# Patient Record
Sex: Male | Born: 1960 | Race: Black or African American | Hispanic: No | Marital: Married | Smoking: Never smoker
Health system: Southern US, Community
[De-identification: ages and names within clinical notes are randomized; demographics above are authoritative.]

## PROBLEM LIST (undated history)

## (undated) DIAGNOSIS — G4730 Sleep apnea, unspecified: Secondary | ICD-10-CM

## (undated) DIAGNOSIS — I714 Abdominal aortic aneurysm, without rupture, unspecified: Secondary | ICD-10-CM

## (undated) DIAGNOSIS — E785 Hyperlipidemia, unspecified: Secondary | ICD-10-CM

## (undated) DIAGNOSIS — I499 Cardiac arrhythmia, unspecified: Secondary | ICD-10-CM

## (undated) DIAGNOSIS — R079 Chest pain, unspecified: Secondary | ICD-10-CM

## (undated) DIAGNOSIS — R350 Frequency of micturition: Secondary | ICD-10-CM

## (undated) DIAGNOSIS — I2510 Atherosclerotic heart disease of native coronary artery without angina pectoris: Secondary | ICD-10-CM

## (undated) DIAGNOSIS — N182 Chronic kidney disease, stage 2 (mild): Secondary | ICD-10-CM

## (undated) DIAGNOSIS — N179 Acute kidney failure, unspecified: Secondary | ICD-10-CM

## (undated) DIAGNOSIS — H9191 Unspecified hearing loss, right ear: Secondary | ICD-10-CM

## (undated) DIAGNOSIS — Z87442 Personal history of urinary calculi: Secondary | ICD-10-CM

## (undated) DIAGNOSIS — I10 Essential (primary) hypertension: Secondary | ICD-10-CM

## (undated) DIAGNOSIS — F329 Major depressive disorder, single episode, unspecified: Secondary | ICD-10-CM

## (undated) DIAGNOSIS — Z951 Presence of aortocoronary bypass graft: Secondary | ICD-10-CM

## (undated) DIAGNOSIS — Q279 Congenital malformation of peripheral vascular system, unspecified: Secondary | ICD-10-CM

## (undated) DIAGNOSIS — Z9884 Bariatric surgery status: Secondary | ICD-10-CM

## (undated) DIAGNOSIS — F32A Depression, unspecified: Secondary | ICD-10-CM

## (undated) HISTORY — DX: Congenital malformation of peripheral vascular system, unspecified: Q27.9

## (undated) HISTORY — DX: Chronic kidney disease, stage 2 (mild): N18.2

## (undated) HISTORY — DX: Abdominal aortic aneurysm, without rupture, unspecified: I71.40

## (undated) HISTORY — PX: COLONOSCOPY: SHX174

## (undated) HISTORY — DX: Abdominal aortic aneurysm, without rupture: I71.4

## (undated) HISTORY — DX: Hyperlipidemia, unspecified: E78.5

## (undated) HISTORY — PX: ESOPHAGOGASTRODUODENOSCOPY: SHX1529

## (undated) HISTORY — PX: CARDIAC CATHETERIZATION: SHX172

---

## 1999-05-04 HISTORY — PX: CORONARY ARTERY BYPASS GRAFT: SHX141

## 2007-05-04 HISTORY — PX: BACK SURGERY: SHX140

## 2008-05-03 HISTORY — PX: LAPAROSCOPIC GASTRIC BYPASS: SUR771

## 2009-03-06 DIAGNOSIS — Z9884 Bariatric surgery status: Secondary | ICD-10-CM

## 2009-03-06 HISTORY — DX: Bariatric surgery status: Z98.84

## 2013-03-05 ENCOUNTER — Inpatient Hospital Stay (HOSPITAL_COMMUNITY)
Admission: EM | Admit: 2013-03-05 | Discharge: 2013-03-09 | DRG: 336 | Disposition: A | Discharge status: Other Acute Inpatient Hospital | Payer: BC Managed Care – PPO | Attending: Surgery | Admitting: Surgery

## 2013-03-05 ENCOUNTER — Emergency Department (HOSPITAL_COMMUNITY): Payer: BC Managed Care – PPO

## 2013-03-05 ENCOUNTER — Encounter (HOSPITAL_COMMUNITY)
Admission: EM | Disposition: A | Discharge status: Other Acute Inpatient Hospital | Payer: Self-pay | Source: Home / Self Care

## 2013-03-05 ENCOUNTER — Encounter (HOSPITAL_COMMUNITY): Payer: Self-pay | Admitting: Emergency Medicine

## 2013-03-05 DIAGNOSIS — K56609 Unspecified intestinal obstruction, unspecified as to partial versus complete obstruction: Secondary | ICD-10-CM | POA: Diagnosis present

## 2013-03-05 DIAGNOSIS — I714 Abdominal aortic aneurysm, without rupture, unspecified: Secondary | ICD-10-CM | POA: Diagnosis present

## 2013-03-05 DIAGNOSIS — K565 Intestinal adhesions [bands], unspecified as to partial versus complete obstruction: Principal | ICD-10-CM | POA: Diagnosis present

## 2013-03-05 DIAGNOSIS — I428 Other cardiomyopathies: Secondary | ICD-10-CM | POA: Diagnosis present

## 2013-03-05 DIAGNOSIS — I10 Essential (primary) hypertension: Secondary | ICD-10-CM | POA: Diagnosis present

## 2013-03-05 DIAGNOSIS — F1010 Alcohol abuse, uncomplicated: Secondary | ICD-10-CM | POA: Diagnosis present

## 2013-03-05 DIAGNOSIS — K458 Other specified abdominal hernia without obstruction or gangrene: Secondary | ICD-10-CM | POA: Diagnosis present

## 2013-03-05 DIAGNOSIS — Q438 Other specified congenital malformations of intestine: Secondary | ICD-10-CM

## 2013-03-05 DIAGNOSIS — Z79899 Other long term (current) drug therapy: Secondary | ICD-10-CM

## 2013-03-05 DIAGNOSIS — I2510 Atherosclerotic heart disease of native coronary artery without angina pectoris: Secondary | ICD-10-CM | POA: Diagnosis present

## 2013-03-05 DIAGNOSIS — E876 Hypokalemia: Secondary | ICD-10-CM | POA: Diagnosis present

## 2013-03-05 DIAGNOSIS — Z9884 Bariatric surgery status: Secondary | ICD-10-CM

## 2013-03-05 DIAGNOSIS — Z951 Presence of aortocoronary bypass graft: Secondary | ICD-10-CM

## 2013-03-05 HISTORY — DX: Presence of aortocoronary bypass graft: Z95.1

## 2013-03-05 HISTORY — DX: Essential (primary) hypertension: I10

## 2013-03-05 HISTORY — DX: Bariatric surgery status: Z98.84

## 2013-03-05 LAB — COMPREHENSIVE METABOLIC PANEL
ALT: 21 U/L (ref 0–53)
AST: 30 U/L (ref 0–37)
Alkaline Phosphatase: 59 U/L (ref 39–117)
CO2: 27 mEq/L (ref 19–32)
Calcium: 9.6 mg/dL (ref 8.4–10.5)
GFR calc Af Amer: 53 mL/min — ABNORMAL LOW (ref >90)
GFR calc non Af Amer: 46 mL/min — ABNORMAL LOW (ref >90)
Potassium: 4 mEq/L (ref 3.5–5.1)
Sodium: 138 mEq/L (ref 135–145)
Total Bilirubin: 0.5 mg/dL (ref 0.3–1.2)
Total Protein: 7.9 g/dL (ref 6.0–8.3)

## 2013-03-05 LAB — CBC WITH DIFFERENTIAL/PLATELET
Basophils Absolute: 0 10*3/uL (ref 0.0–0.1)
Basophils Relative: 0 % (ref 0–1)
Eosinophils Absolute: 0.1 10*3/uL (ref 0.0–0.7)
HCT: 33.7 % — ABNORMAL LOW (ref 39.0–52.0)
Hemoglobin: 11.1 g/dL — ABNORMAL LOW (ref 13.0–17.0)
Lymphocytes Relative: 6 % — ABNORMAL LOW (ref 12–46)
MCH: 24.7 pg — ABNORMAL LOW (ref 26.0–34.0)
MCHC: 32.9 g/dL (ref 30.0–36.0)
Monocytes Absolute: 0.4 10*3/uL (ref 0.1–1.0)
Monocytes Relative: 5 % (ref 3–12)
Neutro Abs: 6.8 10*3/uL (ref 1.7–7.7)
Platelets: 154 10*3/uL (ref 150–400)
RDW: 19.2 % — ABNORMAL HIGH (ref 11.5–15.5)
WBC: 7.8 10*3/uL (ref 4.0–10.5)

## 2013-03-05 LAB — LIPASE, BLOOD: Lipase: 194 U/L — ABNORMAL HIGH (ref 11–59)

## 2013-03-05 LAB — ETHANOL: Alcohol, Ethyl (B): <11 mg/dL (ref 0–11)

## 2013-03-05 SURGERY — LAPAROTOMY, EXPLORATORY
Anesthesia: General | Site: Abdomen

## 2013-03-05 SURGERY — LAPAROSCOPY, DIAGNOSTIC
Anesthesia: General | Site: Abdomen

## 2013-03-05 MED ORDER — LIDOCAINE HCL 2 % EX GEL
CUTANEOUS | Status: AC
  Filled 2013-03-05: qty 10

## 2013-03-05 MED ORDER — IOHEXOL 300 MG/ML  SOLN
100.0000 mL | Freq: Once | INTRAMUSCULAR | Status: AC | PRN
  Administered 2013-03-05: 100 mL via INTRAVENOUS

## 2013-03-05 MED ORDER — LACTATED RINGERS IV BOLUS (SEPSIS)
1000.0000 mL | Freq: Once | INTRAVENOUS | Status: AC
  Administered 2013-03-05: 1000 mL via INTRAVENOUS

## 2013-03-05 MED ORDER — PANTOPRAZOLE SODIUM 40 MG IV SOLR
40.0000 mg | Freq: Once | INTRAVENOUS | Status: AC
  Administered 2013-03-05: 40 mg via INTRAVENOUS
  Filled 2013-03-05: qty 40

## 2013-03-05 MED ORDER — IOHEXOL 300 MG/ML  SOLN
50.0000 mL | Freq: Once | INTRAMUSCULAR | Status: AC | PRN
  Administered 2013-03-05: 50 mL via ORAL

## 2013-03-05 MED ORDER — ONDANSETRON HCL 4 MG/2ML IJ SOLN
4.0000 mg | Freq: Once | INTRAMUSCULAR | Status: AC
  Administered 2013-03-05: 4 mg via INTRAVENOUS
  Filled 2013-03-05: qty 2

## 2013-03-05 NOTE — ED Notes (Signed)
Patient transported to X-ray 

## 2013-03-05 NOTE — ED Notes (Signed)
Pt from [facility], last ETOH 11/1, pint of whiskey. After pt is medically cleared can go back to [facility].  Call leslie from [facility], [phone]

## 2013-03-05 NOTE — ED Notes (Signed)
Bed: [facility] Expected date:  Expected time:  Means of arrival:  Comments: ems- from [facility], left sided abdominal pain, last ETOH 11/1, pint of whiskey , after medically cleared sent back to [facility]

## 2013-03-05 NOTE — Progress Notes (Signed)
Patient confirms his pcp is DR. Ashok Croon.  EDCM unable to locate this physician.

## 2013-03-05 NOTE — ED Notes (Signed)
Patient transported to Ultrasound 

## 2013-03-05 NOTE — ED Notes (Signed)
PER EMS: pt c/o abdominal pain right upper quadrant. 8/10 pain. N/V. 20 g L AC 4 mg of Zofran. Coming from [facility] detox from alcohol. NO NARCOTICS PLEASE per [facility].

## 2013-03-05 NOTE — ED Provider Notes (Signed)
CSN: [id number]     Arrival date & time 03/05/13  1736 History   First MD Initiated Contact with Patient 03/05/13 1746     Chief Complaint  Patient presents with  . Abdominal Pain   (Consider location/radiation/quality/duration/timing/severity/associated sxs/prior Treatment) HPI Comments: Pt with hx of gastric bypass at an outside hospital 3 years ago, alcohol abuse - currently at a rehab comes in with cc of abd pain. His current pain started last night, and has progressively gotten worse. The pain is epigastric, diffuse and with some nausea. Last BM was y'day - and was loose. Pt has no appetite.   Patient is a 52 y.o. male presenting with abdominal pain. The history is provided by the patient.  Abdominal Pain Associated symptoms: diarrhea, nausea and vomiting   Associated symptoms: no chest pain, no chills, no cough, no dysuria, no fever and no shortness of breath     Past Medical History  Diagnosis Date  . Hx of CABG   . Hypertension    Past Surgical History  Procedure Laterality Date  . Coronary artery bypass graft     No family history on file. History  Substance Use Topics  . Smoking status: Unknown If Ever Smoked  . Smokeless tobacco: Not on file  . Alcohol Use: No     Comment: pt detoxing     Review of Systems  Constitutional: Negative for fever, chills and activity change.  Eyes: Negative for visual disturbance.  Respiratory: Negative for cough, chest tightness and shortness of breath.   Cardiovascular: Negative for chest pain.  Gastrointestinal: Positive for nausea, vomiting, abdominal pain and diarrhea. Negative for blood in stool and abdominal distention.  Genitourinary: Negative for dysuria, enuresis and difficulty urinating.  Musculoskeletal: Negative for arthralgias and neck pain.  Neurological: Positive for dizziness. Negative for light-headedness and headaches.  Psychiatric/Behavioral: Negative for confusion.    Allergies  Review of patient's allergies  indicates no known allergies.  Home Medications   Current Outpatient Rx  Name  Route  Sig  Dispense  Refill  . amLODipine (NORVASC) 5 MG tablet   Oral   Take 5 mg by mouth daily.         Marland Kitchen aspirin 81 MG tablet   Oral   Take 81 mg by mouth daily.         . carvedilol (COREG) 25 MG tablet   Oral   Take 25 mg by mouth 3 (three) times daily with meals.         . chlordiazePOXIDE (LIBRIUM) 10 MG capsule   Oral   Take 20 mg by mouth daily as needed for anxiety.         . cholecalciferol (VITAMIN D) 1000 UNITS tablet   Oral   Take 2,000 Units by mouth daily.         Marland Kitchen ibuprofen (ADVIL,MOTRIN) 200 MG tablet   Oral   Take 200 mg by mouth every 6 (six) hours as needed for pain.         Marland Kitchen lisinopril (PRINIVIL,ZESTRIL) 40 MG tablet   Oral   Take 40 mg by mouth daily.         . potassium chloride SA (K-DUR,KLOR-CON) 20 MEQ tablet   Oral   Take 20 mEq by mouth 3 (three) times daily.         . simvastatin (ZOCOR) 20 MG tablet   Oral   Take 20 mg by mouth every evening.  BP 158/111  Pulse 91  Temp(Src) 99.6 F (37.6 C) (Oral)  Resp 16  SpO2 99% Physical Exam  Nursing note and vitals reviewed. Constitutional: He is oriented to person, place, and time. He appears well-developed.  HENT:  Head: Normocephalic and atraumatic.  Eyes: Conjunctivae and EOM are normal. Pupils are equal, round, and reactive to light.  Neck: Normal range of motion. Neck supple.  Cardiovascular: Normal rate and regular rhythm.   Pulmonary/Chest: Effort normal and breath sounds normal.  Abdominal: Soft. Bowel sounds are normal. He exhibits distension. There is tenderness. There is guarding. There is no rebound.  Neurological: He is alert and oriented to person, place, and time.  Skin: Skin is warm.    ED Course  Procedures (including critical care time) Labs Review Labs Reviewed  COMPREHENSIVE METABOLIC PANEL - Abnormal; Notable for the following:    Glucose, Bld 117  (*)    Creatinine, Ser 1.66 (*)    GFR calc non Af Amer 46 (*)    GFR calc Af Amer 53 (*)    All other components within normal limits  CBC WITH DIFFERENTIAL - Abnormal; Notable for the following:    Hemoglobin 11.1 (*)    HCT 33.7 (*)    MCV 75.1 (*)    MCH 24.7 (*)    RDW 19.2 (*)    Neutrophils Relative % 88 (*)    Lymphocytes Relative 6 (*)    Lymphs Abs 0.5 (*)    All other components within normal limits  LIPASE, BLOOD - Abnormal; Notable for the following:    Lipase 194 (*)    All other components within normal limits  ETHANOL  CG4 I-STAT (LACTIC ACID)   Imaging Review US Abdomen Complete  03/05/2013   CLINICAL DATA:  Abdominal pain. History of a gastric bypass surgery.  EXAM: ULTRASOUND ABDOMEN COMPLETE  COMPARISON:  None.  FINDINGS: Gallbladder  No gallstones or wall thickening visualized. No sonographic Murphy sign noted.  Common bile duct  Diameter: 5.3 mm. No duct stone is seen.  Liver  Lateral segment of the left lobe is mostly obscured. Remaining portions of the liver are within normal limits. Hepatopetal flow documented in the portal vein.  IVC  Obscured by midline bowel gas  Pancreas  Obscured by midline bowel gas  Spleen  Size and appearance within normal limits.  Right Kidney  Length: 12.0 cm. Echogenicity within normal limits. No mass or hydronephrosis visualized.  Left Kidney  Length: 12.2 cm. Echogenicity within normal limits. No mass or hydronephrosis visualized.  Abdominal aorta  Not visualized  IMPRESSION: 1. No acute findings. Normal gallbladder with no bile duct dilation. 2. Increased bowel gas limits exam obscuring the midline structures.   Electronically Signed   By: Amie Portland M.D.   On: 03/05/2013 20:12   Dg Abd Acute W/chest  03/05/2013   CLINICAL DATA:  52 year old male with nausea vomiting diarrhea.  EXAM: ACUTE ABDOMEN SERIES (ABDOMEN 2 VIEW & CHEST 1 VIEW)  COMPARISON:  None.  FINDINGS: Mild cardiomegaly. Other mediastinal contours are within normal  limits. Previous median sternotomy. Lungs are clear. No pneumothorax or pneumoperitoneum.  Dilated gas-filled small bowel loops with air-fluid levels. Small bowel measures up to 48 mm diameter. There is some colonic gas present. There are staple lines and surgical clips in the left abdomen.  No acute osseous abnormality identified.  IMPRESSION: 1. Acute small bowel obstruction, perhaps due to adhesions given evidence of previous left abdominal surgery. No pneumoperitoneum.  2. No  acute cardiopulmonary abnormality.   Electronically Signed   By: Augusto Gamble M.D.   On: 03/05/2013 20:40    EKG Interpretation   None       MDM  No diagnosis found.  Pt comes in with cc of abd pain.  DDx includes: Pancreatitis Hepatobiliary pathology including cholecystitis Gastritis/PUD SBO ACS syndrome Aortic Dissection  Pt's AAS shows SBO, and [place] is negative, with some lipase elevation. Will call surgery.      Derwood Kaplan, MD 03/05/13 2153

## 2013-03-05 NOTE — ED Notes (Signed)
NG suctioning turned off until pt returns from CT per Dr Brandy Hale verbal order.

## 2013-03-06 ENCOUNTER — Encounter (HOSPITAL_COMMUNITY): Payer: Self-pay | Admitting: Surgery

## 2013-03-06 ENCOUNTER — Inpatient Hospital Stay (HOSPITAL_COMMUNITY): Payer: BC Managed Care – PPO | Admitting: Anesthesiology

## 2013-03-06 ENCOUNTER — Encounter (HOSPITAL_COMMUNITY)
Admission: EM | Disposition: A | Discharge status: Other Acute Inpatient Hospital | Payer: Self-pay | Source: Home / Self Care

## 2013-03-06 ENCOUNTER — Encounter (HOSPITAL_COMMUNITY): Payer: BC Managed Care – PPO | Admitting: Anesthesiology

## 2013-03-06 DIAGNOSIS — I714 Abdominal aortic aneurysm, without rupture, unspecified: Secondary | ICD-10-CM

## 2013-03-06 DIAGNOSIS — K56609 Unspecified intestinal obstruction, unspecified as to partial versus complete obstruction: Secondary | ICD-10-CM | POA: Diagnosis present

## 2013-03-06 DIAGNOSIS — K660 Peritoneal adhesions (postprocedural) (postinfection): Secondary | ICD-10-CM

## 2013-03-06 DIAGNOSIS — Z9884 Bariatric surgery status: Secondary | ICD-10-CM

## 2013-03-06 HISTORY — PX: LAPAROSCOPY: SHX197

## 2013-03-06 HISTORY — PX: LAPAROSCOPIC LYSIS OF ADHESIONS: SHX5905

## 2013-03-06 LAB — CBC
Hemoglobin: 9.4 g/dL — ABNORMAL LOW (ref 13.0–17.0)
MCHC: 32.4 g/dL (ref 30.0–36.0)
RDW: 19.8 % — ABNORMAL HIGH (ref 11.5–15.5)
WBC: 3.4 10*3/uL — ABNORMAL LOW (ref 4.0–10.5)

## 2013-03-06 LAB — GLUCOSE, CAPILLARY: Glucose-Capillary: 96 mg/dL (ref 70–99)

## 2013-03-06 LAB — CREATININE, SERUM
Creatinine, Ser: 1.56 mg/dL — ABNORMAL HIGH (ref 0.50–1.35)
GFR calc non Af Amer: 49 mL/min — ABNORMAL LOW (ref >90)

## 2013-03-06 SURGERY — LAPAROSCOPY, DIAGNOSTIC
Anesthesia: General | Site: Abdomen

## 2013-03-06 MED ORDER — SODIUM CHLORIDE 0.9 % IJ SOLN
INTRAMUSCULAR | Status: AC
  Filled 2013-03-06: qty 3

## 2013-03-06 MED ORDER — CEFAZOLIN SODIUM-DEXTROSE 2-3 GM-% IV SOLR
2.0000 g | Freq: Once | INTRAVENOUS | Status: AC
  Administered 2013-03-06: 2 g via INTRAVENOUS
  Filled 2013-03-06: qty 50

## 2013-03-06 MED ORDER — BUPIVACAINE-EPINEPHRINE 0.25% -1:200000 IJ SOLN
INTRAMUSCULAR | Status: AC
  Filled 2013-03-06: qty 2

## 2013-03-06 MED ORDER — LACTATED RINGERS IV SOLN: INTRAVENOUS | Status: DC

## 2013-03-06 MED ORDER — BISACODYL 10 MG RE SUPP: 10.0000 mg | Freq: Two times a day (BID) | RECTAL | Status: DC | PRN

## 2013-03-06 MED ORDER — MENTHOL 3 MG MT LOZG
1.0000 | LOZENGE | OROMUCOSAL | Status: DC | PRN
  Filled 2013-03-06: qty 9

## 2013-03-06 MED ORDER — VITAMIN B-1 100 MG PO TABS
100.0000 mg | ORAL_TABLET | Freq: Every day | ORAL | Status: DC
  Administered 2013-03-07 – 2013-03-09 (×3): 100 mg via ORAL
  Filled 2013-03-06 (×4): qty 1

## 2013-03-06 MED ORDER — HYDROMORPHONE HCL PF 1 MG/ML IJ SOLN
0.5000 mg | INTRAMUSCULAR | Status: DC | PRN
  Filled 2013-03-06: qty 1

## 2013-03-06 MED ORDER — DIPHENHYDRAMINE HCL 50 MG/ML IJ SOLN: 12.5000 mg | Freq: Four times a day (QID) | INTRAMUSCULAR | Status: DC | PRN

## 2013-03-06 MED ORDER — LORAZEPAM 2 MG/ML IJ SOLN: 0.0000 mg | Freq: Two times a day (BID) | INTRAMUSCULAR | Status: DC

## 2013-03-06 MED ORDER — PANTOPRAZOLE SODIUM 40 MG IV SOLR
40.0000 mg | Freq: Two times a day (BID) | INTRAVENOUS | Status: DC
  Administered 2013-03-06 – 2013-03-08 (×4): 40 mg via INTRAVENOUS
  Filled 2013-03-06 (×5): qty 40

## 2013-03-06 MED ORDER — EPHEDRINE SULFATE 50 MG/ML IJ SOLN
INTRAMUSCULAR | Status: DC | PRN
  Administered 2013-03-06: 10 mg via INTRAVENOUS

## 2013-03-06 MED ORDER — LORAZEPAM 1 MG PO TABS: 1.0000 mg | ORAL_TABLET | Freq: Four times a day (QID) | ORAL | Status: AC | PRN

## 2013-03-06 MED ORDER — CISATRACURIUM BESYLATE (PF) 10 MG/5ML IV SOLN
INTRAVENOUS | Status: DC | PRN
  Administered 2013-03-06: 2 mg via INTRAVENOUS
  Administered 2013-03-06: 8 mg via INTRAVENOUS

## 2013-03-06 MED ORDER — PHENYLEPHRINE HCL 10 MG/ML IJ SOLN
INTRAMUSCULAR | Status: DC | PRN
  Administered 2013-03-06 (×3): 40 ug via INTRAVENOUS

## 2013-03-06 MED ORDER — LORAZEPAM 2 MG/ML IJ SOLN
0.0000 mg | Freq: Four times a day (QID) | INTRAMUSCULAR | Status: AC
  Administered 2013-03-06: 2 mg via INTRAVENOUS
  Filled 2013-03-06 (×2): qty 1

## 2013-03-06 MED ORDER — ALUM & MAG HYDROXIDE-SIMETH 200-200-20 MG/5ML PO SUSP: 30.0000 mL | Freq: Four times a day (QID) | ORAL | Status: DC | PRN

## 2013-03-06 MED ORDER — ADULT MULTIVITAMIN W/MINERALS CH
1.0000 | ORAL_TABLET | Freq: Every day | ORAL | Status: DC
  Administered 2013-03-07 – 2013-03-09 (×3): 1 via ORAL
  Filled 2013-03-06 (×4): qty 1

## 2013-03-06 MED ORDER — AMLODIPINE BESYLATE 5 MG PO TABS
5.0000 mg | ORAL_TABLET | Freq: Every day | ORAL | Status: DC
  Administered 2013-03-06 – 2013-03-09 (×4): 5 mg via ORAL
  Filled 2013-03-06 (×4): qty 1

## 2013-03-06 MED ORDER — FOLIC ACID 1 MG PO TABS
1.0000 mg | ORAL_TABLET | Freq: Every day | ORAL | Status: DC
  Administered 2013-03-07 – 2013-03-09 (×3): 1 mg via ORAL
  Filled 2013-03-06 (×4): qty 1

## 2013-03-06 MED ORDER — KCL IN DEXTROSE-NACL 20-5-0.9 MEQ/L-%-% IV SOLN
INTRAVENOUS | Status: DC
  Administered 2013-03-06: 18:00:00 via INTRAVENOUS
  Administered 2013-03-07: 125 mL/h via INTRAVENOUS
  Administered 2013-03-07: 02:00:00 via INTRAVENOUS
  Administered 2013-03-07: 125 mL/h via INTRAVENOUS
  Filled 2013-03-06 (×9): qty 1000

## 2013-03-06 MED ORDER — GLYCOPYRROLATE 0.2 MG/ML IJ SOLN
INTRAMUSCULAR | Status: DC | PRN
  Administered 2013-03-06: .8 mg via INTRAVENOUS

## 2013-03-06 MED ORDER — SUCCINYLCHOLINE CHLORIDE 20 MG/ML IJ SOLN
INTRAMUSCULAR | Status: DC | PRN
  Administered 2013-03-06: 100 mg via INTRAVENOUS

## 2013-03-06 MED ORDER — NEOSTIGMINE METHYLSULFATE 1 MG/ML IJ SOLN
INTRAMUSCULAR | Status: DC | PRN
  Administered 2013-03-06: 5 mg via INTRAVENOUS

## 2013-03-06 MED ORDER — KCL IN DEXTROSE-NACL 40-5-0.45 MEQ/L-%-% IV SOLN
INTRAVENOUS | Status: DC
  Filled 2013-03-06 (×2): qty 1000

## 2013-03-06 MED ORDER — MAGIC MOUTHWASH
15.0000 mL | Freq: Four times a day (QID) | ORAL | Status: DC | PRN
  Filled 2013-03-06: qty 15

## 2013-03-06 MED ORDER — LACTATED RINGERS IV SOLN
INTRAVENOUS | Status: DC | PRN
  Administered 2013-03-06 (×2): via INTRAVENOUS

## 2013-03-06 MED ORDER — CARVEDILOL 25 MG PO TABS
25.0000 mg | ORAL_TABLET | Freq: Three times a day (TID) | ORAL | Status: DC
  Administered 2013-03-06 – 2013-03-09 (×9): 25 mg via ORAL
  Filled 2013-03-06 (×11): qty 1

## 2013-03-06 MED ORDER — METOPROLOL TARTRATE 1 MG/ML IV SOLN
5.0000 mg | Freq: Four times a day (QID) | INTRAVENOUS | Status: DC | PRN
  Filled 2013-03-06: qty 5

## 2013-03-06 MED ORDER — FENTANYL CITRATE 0.05 MG/ML IJ SOLN: 25.0000 ug | INTRAMUSCULAR | Status: DC | PRN

## 2013-03-06 MED ORDER — LORAZEPAM 2 MG/ML IJ SOLN
INTRAMUSCULAR | Status: DC | PRN
  Administered 2013-03-06: 2 mg via INTRAVENOUS

## 2013-03-06 MED ORDER — CEFAZOLIN SODIUM-DEXTROSE 2-3 GM-% IV SOLR: 2.0000 g | INTRAVENOUS | Status: DC

## 2013-03-06 MED ORDER — PROPOFOL 10 MG/ML IV BOLUS
INTRAVENOUS | Status: DC | PRN
  Administered 2013-03-06: 170 mg via INTRAVENOUS

## 2013-03-06 MED ORDER — LACTATED RINGERS IR SOLN
Status: DC | PRN
  Administered 2013-03-06: 1000 mL

## 2013-03-06 MED ORDER — 0.9 % SODIUM CHLORIDE (POUR BTL) OPTIME
TOPICAL | Status: DC | PRN
  Administered 2013-03-06: 1000 mL

## 2013-03-06 MED ORDER — METRONIDAZOLE IN NACL 5-0.79 MG/ML-% IV SOLN
INTRAVENOUS | Status: AC
  Filled 2013-03-06: qty 100

## 2013-03-06 MED ORDER — ONDANSETRON HCL 4 MG/2ML IJ SOLN
INTRAMUSCULAR | Status: DC | PRN
  Administered 2013-03-06: 4 mg via INTRAVENOUS

## 2013-03-06 MED ORDER — ACETAMINOPHEN 650 MG RE SUPP: 650.0000 mg | Freq: Four times a day (QID) | RECTAL | Status: DC | PRN

## 2013-03-06 MED ORDER — LACTATED RINGERS IV BOLUS (SEPSIS): 1000.0000 mL | Freq: Once | INTRAVENOUS | Status: DC

## 2013-03-06 MED ORDER — METRONIDAZOLE IN NACL 5-0.79 MG/ML-% IV SOLN
INTRAVENOUS | Status: DC | PRN
  Administered 2013-03-06: 500 mg via INTRAVENOUS

## 2013-03-06 MED ORDER — DEXAMETHASONE SODIUM PHOSPHATE 10 MG/ML IJ SOLN
INTRAMUSCULAR | Status: DC | PRN
  Administered 2013-03-06: 10 mg via INTRAVENOUS

## 2013-03-06 MED ORDER — HEPARIN SODIUM (PORCINE) 5000 UNIT/ML IJ SOLN
5000.0000 [IU] | Freq: Three times a day (TID) | INTRAMUSCULAR | Status: DC
  Administered 2013-03-07 – 2013-03-09 (×6): 5000 [IU] via SUBCUTANEOUS
  Filled 2013-03-06 (×10): qty 1

## 2013-03-06 MED ORDER — THIAMINE HCL 100 MG/ML IJ SOLN
100.0000 mg | Freq: Every day | INTRAMUSCULAR | Status: DC
  Administered 2013-03-06: 100 mg via INTRAVENOUS
  Filled 2013-03-06 (×4): qty 1

## 2013-03-06 MED ORDER — LACTATED RINGERS IV SOLN
INTRAVENOUS | Status: DC | PRN
  Administered 2013-03-06 (×3): via INTRAVENOUS

## 2013-03-06 MED ORDER — LORAZEPAM 2 MG/ML IJ SOLN: 0.5000 mg | Freq: Three times a day (TID) | INTRAMUSCULAR | Status: DC | PRN

## 2013-03-06 MED ORDER — LACTATED RINGERS IV SOLN
INTRAVENOUS | Status: DC
  Administered 2013-03-06: 1000 mL via INTRAVENOUS

## 2013-03-06 MED ORDER — LIP MEDEX EX OINT
1.0000 "application " | TOPICAL_OINTMENT | Freq: Two times a day (BID) | CUTANEOUS | Status: DC
  Administered 2013-03-06 – 2013-03-09 (×5): 1 via TOPICAL
  Filled 2013-03-06 (×2): qty 7

## 2013-03-06 MED ORDER — CEFAZOLIN SODIUM-DEXTROSE 2-3 GM-% IV SOLR
INTRAVENOUS | Status: DC | PRN
  Administered 2013-03-06: 2 g via INTRAVENOUS

## 2013-03-06 MED ORDER — BUPIVACAINE-EPINEPHRINE 0.25% -1:200000 IJ SOLN
INTRAMUSCULAR | Status: DC | PRN
  Administered 2013-03-06: 50 mL

## 2013-03-06 MED ORDER — LORAZEPAM 2 MG/ML IJ SOLN
2.0000 mg | Freq: Once | INTRAMUSCULAR | Status: DC
  Filled 2013-03-06: qty 1

## 2013-03-06 MED ORDER — LACTATED RINGERS IV BOLUS (SEPSIS): 1000.0000 mL | Freq: Three times a day (TID) | INTRAVENOUS | Status: AC | PRN

## 2013-03-06 MED ORDER — HYDROMORPHONE HCL PF 1 MG/ML IJ SOLN: 0.2500 mg | INTRAMUSCULAR | Status: DC | PRN

## 2013-03-06 MED ORDER — FENTANYL CITRATE 0.05 MG/ML IJ SOLN
INTRAMUSCULAR | Status: DC | PRN
  Administered 2013-03-06 (×2): 50 ug via INTRAVENOUS
  Administered 2013-03-06: 125 ug via INTRAVENOUS
  Administered 2013-03-06: 25 ug via INTRAVENOUS
  Administered 2013-03-06: 50 ug via INTRAVENOUS

## 2013-03-06 MED ORDER — LORAZEPAM 2 MG/ML IJ SOLN: 1.0000 mg | Freq: Four times a day (QID) | INTRAMUSCULAR | Status: AC | PRN

## 2013-03-06 MED ORDER — PROMETHAZINE HCL 25 MG/ML IJ SOLN
12.5000 mg | Freq: Four times a day (QID) | INTRAMUSCULAR | Status: DC | PRN
  Filled 2013-03-06: qty 1

## 2013-03-06 MED ORDER — PHENOL 1.4 % MT LIQD
2.0000 | OROMUCOSAL | Status: DC | PRN
  Filled 2013-03-06: qty 177

## 2013-03-06 MED ORDER — METRONIDAZOLE IN NACL 5-0.79 MG/ML-% IV SOLN: 500.0000 mg | INTRAVENOUS | Status: DC

## 2013-03-06 MED ORDER — FENTANYL CITRATE 0.05 MG/ML IJ SOLN: 50.0000 ug | INTRAMUSCULAR | Status: DC | PRN

## 2013-03-06 MED ORDER — CEFAZOLIN SODIUM-DEXTROSE 2-3 GM-% IV SOLR
INTRAVENOUS | Status: AC
  Filled 2013-03-06: qty 50

## 2013-03-06 MED ORDER — ONDANSETRON HCL 4 MG/2ML IJ SOLN: 4.0000 mg | Freq: Four times a day (QID) | INTRAMUSCULAR | Status: DC | PRN

## 2013-03-06 SURGICAL SUPPLY — 66 items
APPLIER CLIP 5 13 M/L LIGAMAX5 (MISCELLANEOUS)
APPLIER CLIP ROT 10 11.4 M/L (STAPLE)
BLADE EXTENDED COATED 6.5IN (ELECTRODE) IMPLANT
BLADE HEX COATED 2.75 (ELECTRODE) ×2 IMPLANT
BLADE SURG SZ10 CARB STEEL (BLADE) ×2 IMPLANT
CANISTER SUCTION 2500CC (MISCELLANEOUS) ×4 IMPLANT
CATH KIT ON Q 7.5IN SLV (PAIN MANAGEMENT) IMPLANT
CHLORAPREP W/TINT 26ML (MISCELLANEOUS) ×4 IMPLANT
CLIP APPLIE 5 13 M/L LIGAMAX5 (MISCELLANEOUS) IMPLANT
CLIP APPLIE ROT 10 11.4 M/L (STAPLE) IMPLANT
COVER MAYO STAND STRL (DRAPES) ×2 IMPLANT
DECANTER SPIKE VIAL GLASS SM (MISCELLANEOUS) ×4 IMPLANT
DERMABOND ADVANCED (GAUZE/BANDAGES/DRESSINGS)
DERMABOND ADVANCED .7 DNX12 (GAUZE/BANDAGES/DRESSINGS) IMPLANT
DRAPE LAPAROSCOPIC ABDOMINAL (DRAPES) ×4 IMPLANT
DRAPE UTILITY XL STRL (DRAPES) ×4 IMPLANT
DRAPE WARM FLUID 44X44 (DRAPE) ×2 IMPLANT
DRSG TEGADERM 2-3/8X2-3/4 SM (GAUZE/BANDAGES/DRESSINGS) ×10 IMPLANT
DRSG TEGADERM 4X4.75 (GAUZE/BANDAGES/DRESSINGS) IMPLANT
ELECT REM PT RETURN 9FT ADLT (ELECTROSURGICAL) ×4
ELECTRODE REM PT RTRN 9FT ADLT (ELECTROSURGICAL) ×2 IMPLANT
GAUZE SPONGE 2X2 8PLY STRL LF (GAUZE/BANDAGES/DRESSINGS) ×1 IMPLANT
GLOVE ECLIPSE 8.0 STRL XLNG CF (GLOVE) ×2 IMPLANT
GLOVE INDICATOR 8.0 STRL GRN (GLOVE) ×2 IMPLANT
GLOVE SURG SS PI 7.5 STRL IVOR (GLOVE) ×4 IMPLANT
GOWN STRL REIN XL XLG (GOWN DISPOSABLE) ×10 IMPLANT
KIT BASIN OR (CUSTOM PROCEDURE TRAY) ×4 IMPLANT
LIGASURE IMPACT 36 18CM CVD LR (INSTRUMENTS) IMPLANT
PENCIL BUTTON HOLSTER BLD 10FT (ELECTRODE) ×2 IMPLANT
SCALPEL HARMONIC ACE (MISCELLANEOUS) IMPLANT
SCISSORS LAP 5X35 DISP (ENDOMECHANICALS) ×4 IMPLANT
SEALER TISSUE G2 CVD JAW 35 (ENDOMECHANICALS) IMPLANT
SEALER TISSUE G2 CVD JAW 45CM (ENDOMECHANICALS)
SEALER TISSUE G2 STRG ARTC 35C (ENDOMECHANICALS) IMPLANT
SET IRRIG TUBING LAPAROSCOPIC (IRRIGATION / IRRIGATOR) ×2 IMPLANT
SLEEVE XCEL OPT CAN 5 100 (ENDOMECHANICALS) ×8 IMPLANT
SOLUTION ANTI FOG 6CC (MISCELLANEOUS) ×2 IMPLANT
SPONGE GAUZE 2X2 STER 10/PKG (GAUZE/BANDAGES/DRESSINGS) ×1
SPONGE GAUZE 4X4 12PLY (GAUZE/BANDAGES/DRESSINGS) ×2 IMPLANT
SPONGE LAP 18X18 X RAY DECT (DISPOSABLE) IMPLANT
STAPLER VISISTAT 35W (STAPLE) IMPLANT
SUCTION POOLE TIP (SUCTIONS) IMPLANT
SUT MNCRL AB 4-0 PS2 18 (SUTURE) ×2 IMPLANT
SUT PDS AB 1 TP1 96 (SUTURE) IMPLANT
SUT PROLENE 2 0 KS (SUTURE) IMPLANT
SUT PROLENE 2 0 SH DA (SUTURE) IMPLANT
SUT SILK 2 0 (SUTURE) ×1
SUT SILK 2 0 SH CR/8 (SUTURE) ×2 IMPLANT
SUT SILK 2-0 18XBRD TIE 12 (SUTURE) ×1 IMPLANT
SUT SILK 3 0 (SUTURE) ×1
SUT SILK 3 0 SH CR/8 (SUTURE) ×2 IMPLANT
SUT SILK 3-0 18XBRD TIE 12 (SUTURE) ×1 IMPLANT
SUT VIC AB 3-0 SH 18 (SUTURE) IMPLANT
SYS LAPSCP GELPORT 120MM (MISCELLANEOUS)
SYSTEM LAPSCP GELPORT 120MM (MISCELLANEOUS) IMPLANT
TOWEL OR 17X26 10 PK STRL BLUE (TOWEL DISPOSABLE) ×4 IMPLANT
TOWEL OR NON WOVEN STRL DISP B (DISPOSABLE) ×4 IMPLANT
TRAY FOLEY CATH 14FRSI W/METER (CATHETERS) ×4 IMPLANT
TRAY LAP CHOLE (CUSTOM PROCEDURE TRAY) ×4 IMPLANT
TROCAR BALLN 12MMX100 BLUNT (TROCAR) IMPLANT
TROCAR BLADELESS OPT 5 100 (ENDOMECHANICALS) ×4 IMPLANT
TROCAR SLEEVE XCEL 5X75 (ENDOMECHANICALS) ×2 IMPLANT
TROCAR XCEL NON-BLD 11X100MML (ENDOMECHANICALS) IMPLANT
TUBING INSUFFLATION 10FT LAP (TUBING) ×4 IMPLANT
TUNNELER SHEATH ON-Q 16GX12 DP (PAIN MANAGEMENT) IMPLANT
YANKAUER SUCT BULB TIP 10FT TU (MISCELLANEOUS) ×2 IMPLANT

## 2013-03-06 NOTE — Anesthesia Postprocedure Evaluation (Signed)
  Anesthesia Post-op Note  Patient: Daniel Blevins  Procedure(s) Performed: Procedure(s) (LRB): LAPAROSCOPY DIAGNOSTIC (N/A) LAPAROSCOPIC LYSIS OF ADHESIONS AND CLOSURE OF INTERNAL HERNIAS x2 (N/A)  Patient Location: PACU  Anesthesia Type: General  Level of Consciousness: awake and alert   Airway and Oxygen Therapy: Patient Spontanous Breathing  Post-op Pain: mild  Post-op Assessment: Post-op Vital signs reviewed, Patient's Cardiovascular Status Stable, Respiratory Function Stable, Patent Airway and No signs of Nausea or vomiting  Last Vitals:  Filed Vitals:   03/06/13 1330  BP: 162/90  Pulse: 65  Temp:   Resp: 13    Post-op Vital Signs: stable   Complications: No apparent anesthesia complications

## 2013-03-06 NOTE — Op Note (Addendum)
03/05/2013 - 03/06/2013  12:51 PM  PATIENT:  Daniel Blevins  52 y.o. male  Patient Care Team: Provider Not In System  PRE-OPERATIVE DIAGNOSIS:  bowel obstruction H/o gastric bypass, r/o internal hernia  POST-OPERATIVE DIAGNOSIS:  Small bowel obstruction  PROCEDURE:  Procedure(s): LAPAROSCOPY DIAGNOSTIC LAPAROSCOPIC LYSIS OF ADHESIONS CLOSURE OF INTERNAL HERNIAS x2  SURGEON:  Surgeon(s): Ardeth Sportsman, MD  ASSISTANT: none   ANESTHESIA:   local and general  EBL:  Total I/O In: 3000 [I.V.:3000] Out: 200 [Urine:200]  Delay start of Pharmacological VTE agent (>24hrs) due to surgical blood loss or risk of bleeding:  no  DRAINS: none   SPECIMEN:  No Specimen  DISPOSITION OF SPECIMEN:  N/A  COUNTS:  YES  PLAN OF CARE: Admit to inpatient   PATIENT DISPOSITION:  PACU - hemodynamically stable.  INDICATION: patient with a history of a left gastric bypass done at [facility]. [place]ame [place] counseling and rehabilitation. Developed nausea vomiting and abdominal pain. In the emergency room. CAT scan shows SBO, transition most likely near the jejunojejunostomy. Concern for internal hernia. Surgical consultation recommended.. Seen by my bariatric surgical partner and then later by myself.  I recommended surgery:  The anatomy & physiology of the digestive tract was discussed.  The pathophysiology of perforation was discussed.  Differential diagnosis such as perforated ulcer or colon, etc was discussed.   Natural history risks without surgery such as death was discussed.  I recommended abdominal exploration to diagnose & treat the source of the problem.  Laparoscopic & open techniques were discussed.   Lysis of adhesions to release adhesions causing internal hernias or bowel obstruction as well as possible suture closure of internal hernias was discussed. Possible conversion to open discussed.  Risks such as bleeding, infection, abscess, leak, reoperation, bowel  resection, possible ostomy, hernia, heart attack, death, and other risks were discussed.   The risks of no intervention will lead to serious problems including death.   I expressed a good likelihood that surgery will address the problem.    Goals of post-operative recovery were discussed as well.  We will work to minimize complications although risks in an emergent setting are high.   Questions were answered.  The patient expressed understanding & wishes to proceed with surgery.      OR FINDINGS:   The patient had adhesion in the right upper quadrant of the transverse colon epiploic appendage to an old 5 mm port site at the RUQ peritoneum. It showed evidence of ischemia and inflammation. Suspicious for the transition point of an internal hernia. Released and resected.  A few greater omental adhesions to hold 5 mm port sites in the midline. Noninflamed.  Adhesion of the sigmoid colon epiploic appendages to the jejunojejunostomy. Some bowel underneath this. Suspicions for the transition point of the bowel obstruction.  Retrocolic gastrojejunostomy. No Peterson hernia defect in the mesocolon. However, some internal hernia defects between the gastrojejunal limb mesentery and the mesocolon more distally towards the jejunojejunostomy. Sutured closed  DESCRIPTION:   Informed consent was confirmed.  The patient underwent general anaesthesia without difficulty.  The patient was positioned appropriately.  VTE prevention in place.  The patient's abdomen was clipped, prepped, & draped in a sterile fashion.  Surgical timeout confirmed our plan.  The patient was positioned in reverse Trendelenburg.  Abdominal entry with a 5mm port was gained using optical entry technique in the left upper abdomen.  Entry was clean.  I induced carbon dioxide insufflation.  Camera inspection revealed no injury.  Extra 5mm ports were carefully placed under direct laparoscopic visualization along the left and right anterior axillary  lines.  I released some adhesions of greater omentum to the anterior abdominal wall.  I located the cecum. It was rather mobile like a bascule. I ran the bowel from the ileocecal valve proximally to the jejunojejunostomy. Loops did seem to dive in the retroperitoneum. In running the intestines more proximally, I noted a band in the right upper quadrant between the transverse colon epiploic appendage to the anterior abdominal wall. It was ecchymotic and inflamed. I released it off the anterior abdominal wall and released and resected it off the transverse colon.  I ran the limbs proximal to the jejunojejunostomy. There was adhesions epiploic appendages off the sigmoid colon to the jejunojejunostomy. This provided an internal hernia. This was another suspicious area of internal band/hernia although bowel was dilated proximal and distal to it. I carefully freed the epiploic appendages off the jejunojejunostomy. I resected the redundant epiploic appendages. However, all his epiploic appendages were rather long in his entire colon, so I left everything else alone.  I could run the biliopancreatic limb which was rather dilated up to the ligament of Treitz. I could run the other gastrojejunal limb up to the mesocolon. It was a retrocolic placed gastrojejunostomy. I could see old sutures at the [place] defect. There was no defect in the mesocolon.  However, I did see some laxity and small defects between the gastrojejunal limb mesentery and the mesocolon/retroperitoneum. Small bowel could easily go up into this although there was no evidence of internal  herniation at the time. I sutured these mesenteric internal hernias closed using 3-0 silk interrupted sutures to good result.  I did copious irrigation. Hemostasis was good. There was any iatrogenic defect in the falciform ligament. I transected and opened up there hole.  I again ran the small bowel from the ileocecal valve to the jejunojejunostomy. I then ran the  limbs proximally. Saw no other defects. No injury.  Hemostasis was excellent. The greater omentum to reflect down to cover up and small intestine. I evacuated the carbon dioxide to remove the final ports. I closed the final port sites at the skin using 4-0 Monocryl stitch. Sterile dressing applied. Patient extubated.  I was able to reach his wife on her cell phone. I updated the patient's wife was operative findings and postoperative plan. Questions answered. She expressed understanding & appreciation. Patient is here for alcohol withdrawal treatment program. We will place him on the CIWA protocol

## 2013-03-06 NOTE — Progress Notes (Signed)
CBC and Serum Creatinine drawn by lab. 

## 2013-03-06 NOTE — ED Notes (Signed)
Bed: WA25 Expected date:  Expected time:  Means of arrival:  Comments: 

## 2013-03-06 NOTE — ED Notes (Signed)
Surgery/OR at bedside.

## 2013-03-06 NOTE — Transfer of Care (Signed)
Immediate Anesthesia Transfer of Care Note  Patient: Daniel Blevins  Procedure(s) Performed: Procedure(s): LAPAROSCOPY DIAGNOSTIC (N/A) LAPAROSCOPIC LYSIS OF ADHESIONS AND CLOSURE OF INTERNAL HERNIAS x2 (N/A)  Patient Location: PACU  Anesthesia Type:General  Level of Consciousness: awake, alert , sedated and patient cooperative  Airway & Oxygen Therapy: Patient Spontanous Breathing and Patient connected to face mask oxygen  Post-op Assessment: Report given to PACU RN and Post -op Vital signs reviewed and stable  Post vital signs: Reviewed and stable  Complications: No apparent anesthesia complications

## 2013-03-06 NOTE — ED Notes (Signed)
OR contacted and is trying to get in touch with surgeon to put in orders for pt admit. Pt wife called and would like to speak to surgeon Oretha Ellis [phone]). Pt intermittent suction and is to have echocardiogram this morning. Please keep wife updated if possible will be flying in from [place] today. Pt is alert and oriented, voices not complaints.

## 2013-03-06 NOTE — ED Notes (Signed)
Dr. Michaell Cowing contacted to discuss plan for pt and possible admission orders prior to surgery. Dr. Michaell Cowing reports he will call back 35min-1hr.

## 2013-03-06 NOTE — Progress Notes (Signed)
Patient with a history of a laparoscopic gastric bypass and [place]. Several years ago. Records not available. Now with nausea vomiting and crampy abdominal pain. CAT scan concerning for bowel obstruction. Internal hernia is potentially fatal in this situation so requires emergent laparoscopic possible open exploration:  The anatomy & physiology of the digestive tract was discussed.  The pathophysiology of intestinal obstruction was discussed.  Natural history risks without surgery was discussed.   I feel the patient has failed non-operative therapies.  The risks of no intervention will lead to serious problems such as necrosis, perforation, dehydration, etc. that outweigh the operative risks; therefore, I recommended abdominal exploration to diagnose & treat the source of the problem.  Laparoscopic & open techniques were discussed.   I expressed a good likelihood that surgery will treat the problem.  Risks such as bleeding, infection, abscess, leak, reoperation, bowel resection, possible ostomy, hernia, heart attack, death, and other risks were discussed.   I noted a good likelihood this will help address the problem.  Goals of post-operative recovery were discussed as well.  We will work to minimize complications. Questions were answered.  The patient expresses understanding & wishes to proceed with surgery.  History of coronary disease. Walks 30 minutes without difficulty equals good exercise tolerance. Discussed with anesthesia this morning. We will check EKG but feel like okay to proceed with surgery. He has an incidentally noted 5 cm abdominal aortic aneurysm. However, emergency surgery cannot be delayed and we will do our best to hopefully minimize chance of rupture with good blood pressure control and doing of laparoscopically, et Karie Soda

## 2013-03-06 NOTE — ED Notes (Addendum)
2 bags of pt belongings with pt label to be transferred with pt to surgery/OR. Pt wedding band in specimen cup in pt belongings bag.

## 2013-03-06 NOTE — H&P (Signed)
Reason for Consult:bowel obstruction Referring Physician: [name] is an 52 y.o. male.  HPI: this patient presents for evaluation of abdominal pain which began yesterday. He has pain across his upper mid abdomen with associated nausea and one episode of nonbilious vomiting. He denies any fevers or chills and says that his bowels have been functioning he denies any hematemesis or blood in the stools. He does have a history of a gastric bypass performed in [place] 3 years ago and he has lost about 120 pounds since his procedure. He has been doing fine until this acute event beginning yesterday. 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  Past Medical History  Diagnosis Date  . Hx of CABG   . Hypertension     Past Surgical History  Procedure Laterality Date  . Coronary artery bypass graft      No family history on file.  Social History:  reports that he does not drink alcohol. His tobacco and drug histories are not on file.  Allergies: No Known Allergies  Medications: I have reviewed the patient's current medications.  Results for orders placed during the hospital encounter of 03/05/13 (from the past 48 hour(s))  COMPREHENSIVE METABOLIC PANEL     Status: Abnormal   Collection Time    03/05/13  6:33 PM      Result Value Range   Sodium 138  135 - 145 mEq/L   Potassium 4.0  3.5 - 5.1 mEq/L   Chloride 99  96 - 112 mEq/L   CO2 27  19 - 32 mEq/L   Glucose, Bld 117 (*) 70 - 99 mg/dL   BUN 21  6 - 23 mg/dL   Creatinine, Ser 9.14 (*) 0.50 - 1.35 mg/dL   Calcium 9.6  8.4 - 78.2 mg/dL   Total Protein 7.9  6.0 - 8.3 g/dL   Albumin 3.7  3.5 - 5.2 g/dL   AST 30  0 - 37 U/L   ALT 21  0 - 53 U/L   Alkaline Phosphatase 59  39 - 117 U/L   Total Bilirubin 0.5  0.3 - 1.2 mg/dL   GFR calc non Af Amer 46 (*) >90 mL/min   GFR calc Af Amer 53 (*) >90 mL/min   Comment: (NOTE)     The eGFR has been calculated using the CKD EPI  equation.     This calculation has not been validated in all clinical situations.     eGFR's persistently <90 mL/min signify possible Chronic Kidney     Disease.  CBC WITH DIFFERENTIAL     Status: Abnormal   Collection Time    03/05/13  6:33 PM      Result Value Range   WBC 7.8  4.0 - 10.5 K/uL   RBC 4.49  4.22 - 5.81 MIL/uL   Hemoglobin 11.1 (*) 13.0 - 17.0 g/dL   HCT 95.6 (*) 21.3 - 08.6 %   MCV 75.1 (*) 78.0 - 100.0 fL   MCH 24.7 (*) 26.0 - 34.0 pg   MCHC 32.9  30.0 - 36.0 g/dL   RDW 57.8 (*) 46.9 - 62.9 %   Platelets 154  150 - 400 K/uL   Comment: SPECIMEN CHECKED FOR CLOTS     REPEATED TO VERIFY   Neutrophils Relative % 88 (*) 43 - 77 %   Lymphocytes Relative 6 (*) 12 - 46 %   Monocytes Relative 5  3 - 12 %  Eosinophils Relative 1  0 - 5 %   Basophils Relative 0  0 - 1 %   Neutro Abs 6.8  1.7 - 7.7 K/uL   Lymphs Abs 0.5 (*) 0.7 - 4.0 K/uL   Monocytes Absolute 0.4  0.1 - 1.0 K/uL   Eosinophils Absolute 0.1  0.0 - 0.7 K/uL   Basophils Absolute 0.0  0.0 - 0.1 K/uL   Smear Review MORPHOLOGY UNREMARKABLE    LIPASE, BLOOD     Status: Abnormal   Collection Time    03/05/13  6:33 PM      Result Value Range   Lipase 194 (*) 11 - 59 U/L  ETHANOL     Status: None   Collection Time    03/05/13  6:36 PM      Result Value Range   Alcohol, Ethyl (B) <11  0 - 11 mg/dL   Comment:            LOWEST DETECTABLE LIMIT FOR     SERUM ALCOHOL IS 11 mg/dL     FOR MEDICAL PURPOSES ONLY  CG4 I-STAT (LACTIC ACID)     Status: None   Collection Time    03/05/13  8:49 PM      Result Value Range   Lactic Acid, Venous 0.99  0.5 - 2.2 mmol/L    US Abdomen Complete  03/05/2013   CLINICAL DATA:  Abdominal pain. History of a gastric bypass surgery.  EXAM: ULTRASOUND ABDOMEN COMPLETE  COMPARISON:  None.  FINDINGS: Gallbladder  No gallstones or wall thickening visualized. No sonographic Murphy sign noted.  Common bile duct  Diameter: 5.3 mm. No duct stone is seen.  Liver  Lateral segment of the  left lobe is mostly obscured. Remaining portions of the liver are within normal limits. Hepatopetal flow documented in the portal vein.  IVC  Obscured by midline bowel gas  Pancreas  Obscured by midline bowel gas  Spleen  Size and appearance within normal limits.  Right Kidney  Length: 12.0 cm. Echogenicity within normal limits. No mass or hydronephrosis visualized.  Left Kidney  Length: 12.2 cm. Echogenicity within normal limits. No mass or hydronephrosis visualized.  Abdominal aorta  Not visualized  IMPRESSION: 1. No acute findings. Normal gallbladder with no bile duct dilation. 2. Increased bowel gas limits exam obscuring the midline structures.   Electronically Signed   By: Amie Portland M.D.   On: 03/05/2013 20:12   Ct Abdomen Pelvis W Contrast  03/05/2013   CLINICAL DATA:  52 year old male with abdominal pain. History of gastric bypass 3 years ago. Initial encounter.  EXAM: CT ABDOMEN AND PELVIS WITH CONTRAST  TECHNIQUE: Multidetector CT imaging of the abdomen and pelvis was performed using the standard protocol following bolus administration of intravenous contrast.  CONTRAST:  50mL OMNIPAQUE IOHEXOL 300 MG/ML SOLN, OMNIPAQUE IOHEXOL 300 MG/ML SOLN  COMPARISON:  Abdominal radiographs 03/05/2013.  FINDINGS: Sequela median sternotomy. Mild cardiomegaly. No pericardial or pleural effusion. Minor left base atelectasis.  And postoperative changes to the lumbar spine. Degenerative changes in the spine. Surveillance  No pelvic free fluid. Mildly distended but otherwise unremarkable bladder. Negative distal colon, largely decompressed. Left and transverse colon is also mostly decompressed. The cecum is on a lax mesenteries, normal appendix and otherwise negative right: .  Terminal ileum small bowel measures 27 mm diameter. There is a gradual transition of dilated distal end small bowel loops to the terminal ileum, without an abrupt transition point. There is intermittent dilation of proximal and  mid small  bowel loops. The patient is status post gastric bypass. Oral contrast has traversed to be a gastrojejunostomy, and the proximal small bowel loops leading from the stomach are decompressed. However, the bypassed portion of the stomach is distended with air and fluid, as is the biliary loop. The small bowel loops remain dilated with fluid to the level of the small bowel anastomosis. Meanwhile, the contrast opacified loops terminate in the left abdomen on series 2, image 42. The staple line. No more distal bowel contrast is present. No abdominal free fluid or free air identified.  There is an enteric tube in place which courses from the distal esophagus into the gastric pouch and just through the gastrojejunostomy (terminating on series 2, image 27).  Negative liver, gallbladder, spleen, pancreas, adrenal glands, and and portal venous system. Negative kidneys except for small low density areas which are probably benign cysts and left lower pole nonobstructing nephrolithiasis.  Dilated infrarenal abdominal aorta up to 15 mm with mural plaque or thrombus. Enlargement of the artery extends to the aortoiliac bifurcation, and the right common iliac artery is enlarged up to 28 mm diameter, the left up to 20 mm. There is a fusiform enlargement of the left internal iliac artery up to 23 mm diameter. Major arterial structures in the abdomen and pelvis remain patent.  IMPRESSION: 1. Status post Roux-en-Y type gastric bypass with obstructed but opacified small bowel loops contiguous with the pancreaticobiliary loop, and also air and fluid distension of the bypassed portion of the stomach.  The gastrojejunostomy is patent, but contrast in the GJ loops abruptly terminates near the small bowel anastomosis in the left abdomen. The more distal small bowel is also dilated with a gradual transition to more normal caliber terminal ileum.  2. No free fluid or free air in the abdomen.  3. Infrarenal abdominal aortic aneurysm measuring up  to 15 mm diameter. 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 Recommend follow up by abdomen and pelvis CTA in 3-6 months, and consider vascular surgery referral/consultation if not already obtained. This recommendation follows ACR consensus guidelines: White Paper of the ACR Incidental Findings Committee II on Vascular Findings. J Am Coll Radiol 2013; 10:789-794.   Electronically Signed   By: Augusto Gamble M.D.   On: 03/05/2013 23:41   Dg Abd Acute W/chest  03/05/2013   CLINICAL DATA:  52 year old male with nausea vomiting diarrhea.  EXAM: ACUTE ABDOMEN SERIES (ABDOMEN 2 VIEW & CHEST 1 VIEW)  COMPARISON:  None.  FINDINGS: Mild cardiomegaly. Other mediastinal contours are within normal limits. Previous median sternotomy. Lungs are clear. No pneumothorax or pneumoperitoneum.  Dilated gas-filled small bowel loops with air-fluid levels. Small bowel measures up to 48 mm diameter. There is some colonic gas present. There are staple lines and surgical clips in the left abdomen.  No acute osseous abnormality identified.  IMPRESSION: 1. Acute small bowel obstruction, perhaps due to adhesions given evidence of previous left abdominal surgery. No pneumoperitoneum.  2. No acute cardiopulmonary abnormality.   Electronically Signed   By: Augusto Gamble M.D.   On: 03/05/2013 20:40    ROS All other review of systems negative or noncontributory except as stated in the HPI  Blood pressure 158/111, pulse 91, temperature 99.6 F (37.6 C), temperature source Oral, resp. rate 16, SpO2 99.00%. General appearance: alert, cooperative, no distress and appears uncomfortable Resp: nonlabored Cardio: normal rate GI: soft, moderate tenderness accross his mid abdomen, moderate distension, no peritoneal signs Extremities: normal  Pulses: 2+ and symmetric Neurologic: Grossly normal  Assessment/Plan: Abdominal pain and bowel obstruction status post laparoscopic Roux-en-Y gastric bypass He has acute onset of  upper abdominal pain and exam and CT scan concerning for bowel obstruction. There is no evidence of internal hernia on CT scan although given his acute bowel obstruction in a patient with a prior history of gastric bypass I am concerned for an internal hernia. He appears to have contrast and air in his bypass stomach concerning for distal obstruction. I have recommended diagnostic laparoscopy to possible internal hernia repair and possible laparotomy and possible bowel resection.  We discussed the possibility conversion to open surgery and the option for continued observation but he would like to proceed with surgery. We talked about the general risks of surgery such as infection and bleeding and pain and scarring and the need for possible bowel resection but without knowing specifically what I will find, it is hard to discuss all of the risks of the procedure. I did discuss with him the possibility of negative laparoscopy and he expressed understanding of this. He would like to proceed with diagnostic laparoscopy and possible internal hernia repair.  Lodema Pilot DAVID 03/06/2013, 1:11 AM

## 2013-03-06 NOTE — Anesthesia Preprocedure Evaluation (Addendum)
Anesthesia Evaluation  Patient identified by MRN, date of birth, ID band Patient awake    Reviewed: Allergy & Precautions, H&P , NPO status , Patient's Chart, lab work & pertinent test results  Airway Mallampati: II TM Distance: >3 FB Neck ROM: full    Dental  (+) Edentulous Upper, Edentulous Lower and Dental Advisory Given   Pulmonary neg pulmonary ROS,  breath sounds clear to auscultation  Pulmonary exam normal       Cardiovascular Exercise Tolerance: Good hypertension, Pt. on medications and On Home Beta Blockers + CABG and + Peripheral Vascular Disease Rhythm:regular Rate:Normal  5 cm AAA by [place] 11/14   Neuro/Psych negative neurological ROS  negative psych ROS   GI/Hepatic negative GI ROS, (+)     substance abuse  alcohol use, In detox now   Endo/Other  negative endocrine ROS  Renal/GU negative Renal ROS  negative genitourinary   Musculoskeletal   Abdominal   Peds  Hematology negative hematology ROS (+)   Anesthesia Other Findings   Reproductive/Obstetrics negative OB ROS                          Anesthesia Physical Anesthesia Plan  ASA: III and emergent  Anesthesia Plan: General   Post-op Pain Management:    Induction: Intravenous, Rapid sequence and Cricoid pressure planned  Airway Management Planned: Oral ETT  Additional Equipment:   Intra-op Plan:   Post-operative Plan:   Informed Consent: I have reviewed the patients History and Physical, chart, labs and discussed the procedure including the risks, benefits and alternatives for the proposed anesthesia with the patient or authorized representative who has indicated his/her understanding and acceptance.   Dental Advisory Given  Plan Discussed with: CRNA and Surgeon  Anesthesia Plan Comments:        Anesthesia Quick Evaluation

## 2013-03-07 ENCOUNTER — Encounter (HOSPITAL_COMMUNITY): Payer: Self-pay | Admitting: Surgery

## 2013-03-07 DIAGNOSIS — K56609 Unspecified intestinal obstruction, unspecified as to partial versus complete obstruction: Secondary | ICD-10-CM

## 2013-03-07 DIAGNOSIS — I517 Cardiomegaly: Secondary | ICD-10-CM

## 2013-03-07 LAB — BASIC METABOLIC PANEL
BUN: 21 mg/dL (ref 6–23)
CO2: 26 mEq/L (ref 19–32)
Calcium: 8.3 mg/dL — ABNORMAL LOW (ref 8.4–10.5)
Creatinine, Ser: 1.21 mg/dL (ref 0.50–1.35)
GFR calc non Af Amer: 67 mL/min — ABNORMAL LOW (ref >90)
Glucose, Bld: 138 mg/dL — ABNORMAL HIGH (ref 70–99)

## 2013-03-07 LAB — CBC
Hemoglobin: 8.7 g/dL — ABNORMAL LOW (ref 13.0–17.0)
MCH: 24.6 pg — ABNORMAL LOW (ref 26.0–34.0)
MCHC: 32 g/dL (ref 30.0–36.0)
MCV: 77.1 fL — ABNORMAL LOW (ref 78.0–100.0)
Platelets: 135 10*3/uL — ABNORMAL LOW (ref 150–400)

## 2013-03-07 MED ORDER — CHLORDIAZEPOXIDE HCL 5 MG PO CAPS
10.0000 mg | ORAL_CAPSULE | Freq: Two times a day (BID) | ORAL | Status: DC
  Administered 2013-03-07: 10 mg via ORAL
  Filled 2013-03-07: qty 2

## 2013-03-07 MED ORDER — QUETIAPINE FUMARATE 50 MG PO TABS
50.0000 mg | ORAL_TABLET | Freq: Every day | ORAL | Status: DC
  Administered 2013-03-07: 50 mg via ORAL
  Filled 2013-03-07 (×3): qty 1

## 2013-03-07 MED ORDER — CHLORDIAZEPOXIDE HCL 5 MG PO CAPS
5.0000 mg | ORAL_CAPSULE | Freq: Four times a day (QID) | ORAL | Status: DC
  Administered 2013-03-07 – 2013-03-08 (×5): 5 mg via ORAL
  Filled 2013-03-07 (×8): qty 1

## 2013-03-07 NOTE — Progress Notes (Signed)
  Echocardiogram 2D Echocardiogram has been performed.  Erminie Foulks 03/07/2013, 9:20 AM

## 2013-03-07 NOTE — Progress Notes (Signed)
1 Day Post-Op  Subjective: He was drinking a 1/5 per day till last Friday, got to Detox here last Saturday.03/03/13. No nausea, not much coming from NG He isn't sure about the dosing of his meds at the Detox.  She says he took tow in the AM and 2 in the PM, but was sleepy all day during classes. Objective: Vital signs in last 24 hours: Temp:  [97.5 F (36.4 C)-98.6 F (37 C)] 98.2 F (36.8 C) (11/05 0534) Pulse Rate:  [57-84] 62 (11/05 0534) Resp:  [13-18] 18 (11/05 0534) BP: (127-163)/(58-90) 142/88 mmHg (11/05 0534) SpO2:  [94 %-100 %] 94 % (11/05 0534) Weight:  [109.77 kg (242 lb)-110.224 kg (243 lb)] 109.77 kg (242 lb) (11/04 1517) Last BM Date: 03/04/13 NG 5 ml  Afebrile, VSS Labs OK Intake/Output from previous day: 11/04 0701 - 11/05 0700 In: 6395.8 [I.V.:6155.8; NG/GT:240] Out: 3105 [Urine:3100; Emesis/NG output:5] Intake/Output this shift: Total I/O In: 120 [NG/GT:120] Out: -   General appearance: alert, cooperative and no distress Resp: clear to auscultation bilaterally GI: soft, sore, but not distended or tender.  Few BS  incisions sites are fine.  Lab Results:   Recent Labs  03/06/13 1315 03/07/13 0417  WBC 3.4* 4.7  HGB 9.4* 8.7*  HCT 29.0* 27.2*  PLT 143* 135*    BMET  Recent Labs  03/05/13 1833 03/06/13 1315 03/07/13 0417  NA 138  --  139  K 4.0  --  3.9  CL 99  --  105  CO2 27  --  26  GLUCOSE 117*  --  138*  BUN 21  --  21  CREATININE 1.66* 1.56* 1.21  CALCIUM 9.6  --  8.3*   PT/INR No results found for this basename: LABPROT, INR,  in the last 72 hours   Recent Labs Lab 03/05/13 1833  AST 30  ALT 21  ALKPHOS 59  BILITOT 0.5  PROT 7.9  ALBUMIN 3.7     Lipase     Component Value Date/Time   LIPASE 194* 03/05/2013 1833     Studies/Results: US Abdomen Complete  03/05/2013   CLINICAL DATA:  Abdominal pain. History of a gastric bypass surgery.  EXAM: ULTRASOUND ABDOMEN COMPLETE  COMPARISON:  None.  FINDINGS: Gallbladder  No  gallstones or wall thickening visualized. No sonographic Murphy sign noted.  Common bile duct  Diameter: 5.3 mm. No duct stone is seen.  Liver  Lateral segment of the left lobe is mostly obscured. Remaining portions of the liver are within normal limits. Hepatopetal flow documented in the portal vein.  IVC  Obscured by midline bowel gas  Pancreas  Obscured by midline bowel gas  Spleen  Size and appearance within normal limits.  Right Kidney  Length: 12.0 cm. Echogenicity within normal limits. No mass or hydronephrosis visualized.  Left Kidney  Length: 12.2 cm. Echogenicity within normal limits. No mass or hydronephrosis visualized.  Abdominal aorta  Not visualized  IMPRESSION: 1. No acute findings. Normal gallbladder with no bile duct dilation. 2. Increased bowel gas limits exam obscuring the midline structures.   Electronically Signed   By: Amie Portland M.D.   On: 03/05/2013 20:12   Ct Abdomen Pelvis W Contrast  03/06/2013   ADDENDUM REPORT: 03/06/2013 04:20  ADDENDUM: This is a correction to the regional dictation. There were 2 voice recognition error is in the regional dictation. Specifically, within the body of the report in the final paragraph, and within conclusion #3 of the original dictation, it should  state that the infrarenal abdominal aortic aneurysm measures up to 50 mm (not 15 mm) in diameter.   Electronically Signed   By: Trudie Reed M.D.   On: 03/06/2013 04:20   03/06/2013   CLINICAL DATA:  52 year old male with abdominal pain. History of gastric bypass 3 years ago. Initial encounter.  EXAM: CT ABDOMEN AND PELVIS WITH CONTRAST  TECHNIQUE: Multidetector CT imaging of the abdomen and pelvis was performed using the standard protocol following bolus administration of intravenous contrast.  CONTRAST:  50mL OMNIPAQUE IOHEXOL 300 MG/ML SOLN, OMNIPAQUE IOHEXOL 300 MG/ML SOLN  COMPARISON:  Abdominal radiographs 03/05/2013.  FINDINGS: Sequela median sternotomy. Mild cardiomegaly. No pericardial  or pleural effusion. Minor left base atelectasis.  And postoperative changes to the lumbar spine. Degenerative changes in the spine. Surveillance  No pelvic free fluid. Mildly distended but otherwise unremarkable bladder. Negative distal colon, largely decompressed. Left and transverse colon is also mostly decompressed. The cecum is on a lax mesenteries, normal appendix and otherwise negative right: .  Terminal ileum small bowel measures 27 mm diameter. There is a gradual transition of dilated distal end small bowel loops to the terminal ileum, without an abrupt transition point. There is intermittent dilation of proximal and mid small bowel loops. The patient is status post gastric bypass. Oral contrast has traversed to be a gastrojejunostomy, and the proximal small bowel loops leading from the stomach are decompressed. However, the bypassed portion of the stomach is distended with air and fluid, as is the biliary loop. The small bowel loops remain dilated with fluid to the level of the small bowel anastomosis. Meanwhile, the contrast opacified loops terminate in the left abdomen on series 2, image 42. The staple line. No more distal bowel contrast is present. No abdominal free fluid or free air identified.  There is an enteric tube in place which courses from the distal esophagus into the gastric pouch and just through the gastrojejunostomy (terminating on series 2, image 27).  Negative liver, gallbladder, spleen, pancreas, adrenal glands, and and portal venous system. Negative kidneys except for small low density areas which are probably benign cysts and left lower pole nonobstructing nephrolithiasis.  Dilated infrarenal abdominal aorta up to 15 mm with mural plaque or thrombus. Enlargement of the artery extends to the aortoiliac bifurcation, and the right common iliac artery is enlarged up to 28 mm diameter, the left up to 20 mm. There is a fusiform enlargement of the left internal iliac artery up to 23 mm  diameter. Major arterial structures in the abdomen and pelvis remain patent.  IMPRESSION: 1. Status post Roux-en-Y type gastric bypass with obstructed but opacified small bowel loops contiguous with the pancreaticobiliary loop, and also air and fluid distension of the bypassed portion of the stomach.  The gastrojejunostomy is patent, but contrast in the GJ loops abruptly terminates near the small bowel anastomosis in the left abdomen. The more distal small bowel is also dilated with a gradual transition to more normal caliber terminal ileum.  2. No free fluid or free air in the abdomen.  3. Infrarenal abdominal aortic aneurysm measuring up to 15 mm diameter. 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 Recommend follow up by abdomen and pelvis CTA in 3-6 months, and consider vascular surgery referral/consultation if not already obtained. This recommendation follows ACR consensus guidelines: White Paper of the ACR Incidental Findings Committee II on Vascular Findings. J Am Coll Radiol 2013; 10:789-794.  Electronically Signed: By: Augusto Gamble M.D. On:  03/05/2013 23:41   Dg Abd Acute W/chest  03/05/2013   CLINICAL DATA:  52 year old male with nausea vomiting diarrhea.  EXAM: ACUTE ABDOMEN SERIES (ABDOMEN 2 VIEW & CHEST 1 VIEW)  COMPARISON:  None.  FINDINGS: Mild cardiomegaly. Other mediastinal contours are within normal limits. Previous median sternotomy. Lungs are clear. No pneumothorax or pneumoperitoneum.  Dilated gas-filled small bowel loops with air-fluid levels. Small bowel measures up to 48 mm diameter. There is some colonic gas present. There are staple lines and surgical clips in the left abdomen.  No acute osseous abnormality identified.  IMPRESSION: 1. Acute small bowel obstruction, perhaps due to adhesions given evidence of previous left abdominal surgery. No pneumoperitoneum.  2. No acute cardiopulmonary abnormality.   Electronically Signed   By: Augusto Gamble M.D.   On: 03/05/2013  20:40    Medications: . amLODipine  5 mg Oral Daily  . carvedilol  25 mg Oral TID WC  . folic acid  1 mg Oral Daily  . heparin  5,000 Units Subcutaneous Q8H  . lip balm  1 application Topical BID  . LORazepam  0-4 mg Intravenous Q6H   Followed by  . [START ON 03/08/2013] LORazepam  0-4 mg Intravenous Q12H  . multivitamin with minerals  1 tablet Oral Daily  . pantoprazole (PROTONIX) IV  40 mg Intravenous Q12H  . thiamine  100 mg Oral Daily   Or  . thiamine  100 mg Intravenous Daily   Prior to Admission medications   Medication Sig Start Date End Date Taking? Authorizing Provider  amLODipine (NORVASC) 5 MG tablet Take 5 mg by mouth daily.   Yes Historical Provider, MD  aspirin 81 MG tablet Take 81 mg by mouth daily.   Yes Historical Provider, MD  carvedilol (COREG) 25 MG tablet Take 25 mg by mouth 3 (three) times daily with meals.   Yes Historical Provider, MD  chlordiazePOXIDE (LIBRIUM) 10 MG capsule Take 20 mg by mouth daily as needed for anxiety.   Yes Historical Provider, MD  cholecalciferol (VITAMIN D) 1000 UNITS tablet Take 2,000 Units by mouth daily.   Yes Historical Provider, MD  ibuprofen (ADVIL,MOTRIN) 200 MG tablet Take 200 mg by mouth every 6 (six) hours as needed for pain.   Yes Historical Provider, MD  lisinopril (PRINIVIL,ZESTRIL) 40 MG tablet Take 40 mg by mouth daily.   Yes Historical Provider, MD  potassium chloride SA (K-DUR,KLOR-CON) 20 MEQ tablet Take 20 mEq by mouth 3 (three) times daily.   Yes Historical Provider, MD  simvastatin (ZOCOR) 20 MG tablet Take 20 mg by mouth every evening.   Yes Historical Provider, MD     Assessment/Plan Small bowel obstruction, H/o gastric bypass, r/o internal hernia. S/p LAPAROSCOPY DIAGNOSTIC, LAPAROSCOPIC LYSIS OF ADHESIONS, CLOSURE OF INTERNAL, HERNIAS x2;   Daniel Sportsman, MD,  03/06/2013.   Hx of CABG  Hypertension Currently at [facility] for ETOH use.   Plan:  I will start back the Librium at 10  mg BID.  I am going to do clamping trails and see how he does in hope of d/cing his NG soon.  mObilize He is on heparin for DVT, and SIWA protocol.  Looking at his records from [facility], he was on sliding scale librium and had 3 of 4 doses of librium 10 mg, qid before admission.  He is suppose to go down to 5 mg qid and then stop after 4 doses.  He has also been switched to Seroquel 50 mg HS.  We will ask pharmacy to fix the medication record so we can reconcile it when he is ready.  I will make those changes now..    LOS: 2 days    Daniel Blevins 03/07/2013

## 2013-03-07 NOTE — Progress Notes (Signed)
Clinical Social Work Department BRIEF PSYCHOSOCIAL ASSESSMENT 03/07/2013  Patient:  Daniel Blevins, Daniel Blevins     Account Number:  [id number]     Admit date:  03/05/2013  Clinical Social Worker:  Candie Chroman  Date/Time:  03/07/2013 11:24 AM  Referred by:  RN  Date Referred:  03/07/2013 Referred for  Other - See comment   Other Referral:   Pt is from [facility].   Interview type:  Patient Other interview type:    PSYCHOSOCIAL DATA Living Status:  WIFE Admitted from facility:   Level of care:   Primary support name:  Daniel Blevins Primary support relationship to patient:  SPOUSE Degree of support available:   supportive    CURRENT CONCERNS Current Concerns  Other - See comment  Post-Acute Placement   Other Concerns:    SOCIAL WORK ASSESSMENT / PLAN Pt is a 52 yr old gentleman admitted from [facility]. CSW met with pt / spouse to assist with d/c planning. Pt was admitted to [facility] last Saturday. He was hospitalized on 11/3 with a SBO which required surgery. Pt would like to return to [facility] when stable. CSW has contacted [facility] and spoke with the DON, Dois Davenport. She is holding pt's bed for his return. Pt will need to be back to baseline / independent with all ADL's and able to ambulate to groups in order to return to program.   Assessment/plan status:  Psychosocial Support/Ongoing Assessment of Needs Other assessment/ plan:   Information/referral to community resources:   Clinical info has been sent to [facility]. Updates will be sent daily until pt is ready for d/c.    PATIENT'S/FAMILY'S RESPONSE TO PLAN OF CARE: Pt is requesting to return to [facility] when he is feeling better. " Everything was going well until this happened."    Cori Razor LCSW [phone]

## 2013-03-07 NOTE — Consult Note (Signed)
Requesting physician: Karie Soda, MD  Primary Care Physician: No primary provider on file.  Reason for consultation: Evaluation of EF   History of Present Illness: Patient is a pleasant 52 y/o man admitted on 11/4 from [facility] where he was undergoing ETOH detox. He lives in [place] and all of his care is at [facility]. He was admitted with abdominal pain and was found to have an SBO that required surgical correction. He has a h/o CAD and had a triple CABG. An ECHO was ordered here that shows an EF of 45% with diffuse hypokinesis. We have been asked to see him for recommendations regarding his ECHO result.   Allergies:  No Known Allergies    Past Medical History  Diagnosis Date  . Hx of CABG   . Hypertension   . History of gastric bypass 03/06/2013    Past Surgical History  Procedure Laterality Date  . Coronary artery bypass graft  2002  . Laparoscopic gastric bypass      [employer]  . Laparoscopy N/A 03/06/2013    Procedure: LAPAROSCOPY DIAGNOSTIC;  Surgeon: Ardeth Sportsman, MD;  Location: [facility];  Service: General;  Laterality: N/A;  . Laparoscopic lysis of adhesions N/A 03/06/2013    Procedure: LAPAROSCOPIC LYSIS OF ADHESIONS AND CLOSURE OF INTERNAL HERNIAS x2;  Surgeon: Ardeth Sportsman, MD;  Location: [facility];  Service: General;  Laterality: N/A;    Scheduled Meds: . amLODipine  5 mg Oral Daily  . carvedilol  25 mg Oral TID WC  . chlordiazePOXIDE  5 mg Oral QID  . folic acid  1 mg Oral Daily  . heparin  5,000 Units Subcutaneous Q8H  . lip balm  1 application Topical BID  . LORazepam  0-4 mg Intravenous Q6H   Followed by  . [START ON 03/08/2013] LORazepam  0-4 mg Intravenous Q12H  . multivitamin with minerals  1 tablet Oral Daily  . pantoprazole (PROTONIX) IV  40 mg Intravenous Q12H  . QUEtiapine  50 mg Oral QHS  . thiamine  100 mg Oral Daily   Or  . thiamine  100 mg Intravenous Daily   Continuous Infusions: . dextrose 5 % and 0.9 %  NaCl with KCl 20 mEq/L 125 mL/hr (03/07/13 1002)   PRN Meds:.acetaminophen, alum & mag hydroxide-simeth, bisacodyl, diphenhydrAMINE, fentaNYL, HYDROmorphone (DILAUDID) injection, lactated ringers, LORazepam, LORazepam, magic mouthwash, menthol-cetylpyridinium, metoprolol, ondansetron, phenol, promethazine  Social History:  reports that he has never smoked. He has never used smokeless tobacco. He reports that he does not use illicit drugs. His alcohol history is not on file.  History reviewed. No pertinent family history.  Review of Systems:  Constitutional: Denies fever, chills, diaphoresis, appetite change and fatigue.  HEENT: Denies photophobia, eye pain, redness, hearing loss, ear pain, congestion, sore throat, rhinorrhea, sneezing, mouth sores, trouble swallowing, neck pain, neck stiffness and tinnitus.   Respiratory: Denies SOB, DOE, cough, chest tightness,  and wheezing.   Cardiovascular: Denies chest pain, palpitations and leg swelling.  Gastrointestinal: Denies nausea, vomiting, abdominal pain, diarrhea, constipation, blood in stool and abdominal distention.  Genitourinary: Denies dysuria, urgency, frequency, hematuria, flank pain and difficulty urinating.  Endocrine: Denies: hot or cold intolerance, sweats, changes in hair or nails, polyuria, polydipsia. Musculoskeletal: Denies myalgias, back pain, joint swelling, arthralgias and gait problem.  Skin: Denies pallor, rash and wound.  Neurological: Denies dizziness, seizures, syncope, weakness, light-headedness, numbness and headaches.  Hematological: Denies adenopathy. Easy bruising, personal or family bleeding history  Psychiatric/Behavioral: Denies  suicidal ideation, mood changes, confusion, nervousness, sleep disturbance and agitation   Physical Exam: Blood pressure 130/80, pulse 60, temperature 98.3 F (36.8 C), temperature source Oral, resp. rate 18, height 5\' 11"  (1.803 m), weight 109.77 kg (242 lb), SpO2 98.00%. Gen: AA Ox3,  NAD HEENT: [place]/AT/PERRL/EOMI Neck: supple, no JVD, no LAD, no bruits, no goiter. CV: RRR, no M/R/G Lungs: CTA B Abd: TTP, +BS Ext: trace bilateral edema, +pedal pulses Neuro: intact and non-focal.  Labs on Admission:  Results for orders placed during the hospital encounter of 03/05/13 (from the past 48 hour(s))  CG4 I-STAT (LACTIC ACID)     Status: None   Collection Time    03/05/13  8:49 PM      Result Value Range   Lactic Acid, Venous 0.99  0.5 - 2.2 mmol/L  CBC     Status: Abnormal   Collection Time    03/06/13  1:15 PM      Result Value Range   WBC 3.4 (*) 4.0 - 10.5 K/uL   RBC 3.80 (*) 4.22 - 5.81 MIL/uL   Hemoglobin 9.4 (*) 13.0 - 17.0 g/dL   HCT 08.6 (*) 57.8 - 46.9 %   MCV 76.3 (*) 78.0 - 100.0 fL   MCH 24.7 (*) 26.0 - 34.0 pg   MCHC 32.4  30.0 - 36.0 g/dL   RDW 62.9 (*) 52.8 - 41.3 %   Platelets 143 (*) 150 - 400 K/uL   Comment: SPECIMEN CHECKED FOR CLOTS     RESULT CHECKED  CREATININE, SERUM     Status: Abnormal   Collection Time    03/06/13  1:15 PM      Result Value Range   Creatinine, Ser 1.56 (*) 0.50 - 1.35 mg/dL   GFR calc non Af Amer 49 (*) >90 mL/min   GFR calc Af Amer 57 (*) >90 mL/min   Comment: (NOTE)     The eGFR has been calculated using the CKD EPI equation.     This calculation has not been validated in all clinical situations.     eGFR's persistently <90 mL/min signify possible Chronic Kidney     Disease.  GLUCOSE, CAPILLARY     Status: None   Collection Time    03/06/13  8:24 PM      Result Value Range   Glucose-Capillary 96  70 - 99 mg/dL  BASIC METABOLIC PANEL     Status: Abnormal   Collection Time    03/07/13  4:17 AM      Result Value Range   Sodium 139  135 - 145 mEq/L   Potassium 3.9  3.5 - 5.1 mEq/L   Chloride 105  96 - 112 mEq/L   CO2 26  19 - 32 mEq/L   Glucose, Bld 138 (*) 70 - 99 mg/dL   BUN 21  6 - 23 mg/dL   Creatinine, Ser 2.44  0.50 - 1.35 mg/dL   Calcium 8.3 (*) 8.4 - 10.5 mg/dL   GFR calc non Af Amer 67 (*) >90  mL/min   GFR calc Af Amer 78 (*) >90 mL/min   Comment: (NOTE)     The eGFR has been calculated using the CKD EPI equation.     This calculation has not been validated in all clinical situations.     eGFR's persistently <90 mL/min signify possible Chronic Kidney     Disease.  CBC     Status: Abnormal   Collection Time    03/07/13  4:17 AM  Result Value Range   WBC 4.7  4.0 - 10.5 K/uL   RBC 3.53 (*) 4.22 - 5.81 MIL/uL   Hemoglobin 8.7 (*) 13.0 - 17.0 g/dL   HCT 40.9 (*) 81.1 - 91.4 %   MCV 77.1 (*) 78.0 - 100.0 fL   MCH 24.6 (*) 26.0 - 34.0 pg   MCHC 32.0  30.0 - 36.0 g/dL   RDW 78.2 (*) 95.6 - 21.3 %   Platelets 135 (*) 150 - 400 K/uL   Comment: REPEATED TO VERIFY    Radiological Exams on Admission: US Abdomen Complete  03/05/2013   CLINICAL DATA:  Abdominal pain. History of a gastric bypass surgery.  EXAM: ULTRASOUND ABDOMEN COMPLETE  COMPARISON:  None.  FINDINGS: Gallbladder  No gallstones or wall thickening visualized. No sonographic Murphy sign noted.  Common bile duct  Diameter: 5.3 mm. No duct stone is seen.  Liver  Lateral segment of the left lobe is mostly obscured. Remaining portions of the liver are within normal limits. Hepatopetal flow documented in the portal vein.  IVC  Obscured by midline bowel gas  Pancreas  Obscured by midline bowel gas  Spleen  Size and appearance within normal limits.  Right Kidney  Length: 12.0 cm. Echogenicity within normal limits. No mass or hydronephrosis visualized.  Left Kidney  Length: 12.2 cm. Echogenicity within normal limits. No mass or hydronephrosis visualized.  Abdominal aorta  Not visualized  IMPRESSION: 1. No acute findings. Normal gallbladder with no bile duct dilation. 2. Increased bowel gas limits exam obscuring the midline structures.   Electronically Signed   By: Amie Portland M.D.   On: 03/05/2013 20:12   Ct Abdomen Pelvis W Contrast  03/06/2013   ADDENDUM REPORT: 03/06/2013 04:20  ADDENDUM: This is a correction to the regional  dictation. There were 2 voice recognition error is in the regional dictation. Specifically, within the body of the report in the final paragraph, and within conclusion #3 of the original dictation, it should state that the infrarenal abdominal aortic aneurysm measures up to 50 mm (not 15 mm) in diameter.   Electronically Signed   By: Trudie Reed M.D.   On: 03/06/2013 04:20   03/06/2013   CLINICAL DATA:  52 year old male with abdominal pain. History of gastric bypass 3 years ago. Initial encounter.  EXAM: CT ABDOMEN AND PELVIS WITH CONTRAST  TECHNIQUE: Multidetector CT imaging of the abdomen and pelvis was performed using the standard protocol following bolus administration of intravenous contrast.  CONTRAST:  50mL OMNIPAQUE IOHEXOL 300 MG/ML SOLN, OMNIPAQUE IOHEXOL 300 MG/ML SOLN  COMPARISON:  Abdominal radiographs 03/05/2013.  FINDINGS: Sequela median sternotomy. Mild cardiomegaly. No pericardial or pleural effusion. Minor left base atelectasis.  And postoperative changes to the lumbar spine. Degenerative changes in the spine. Surveillance  No pelvic free fluid. Mildly distended but otherwise unremarkable bladder. Negative distal colon, largely decompressed. Left and transverse colon is also mostly decompressed. The cecum is on a lax mesenteries, normal appendix and otherwise negative right: .  Terminal ileum small bowel measures 27 mm diameter. There is a gradual transition of dilated distal end small bowel loops to the terminal ileum, without an abrupt transition point. There is intermittent dilation of proximal and mid small bowel loops. The patient is status post gastric bypass. Oral contrast has traversed to be a gastrojejunostomy, and the proximal small bowel loops leading from the stomach are decompressed. However, the bypassed portion of the stomach is distended with air and fluid, as is the biliary loop.  The small bowel loops remain dilated with fluid to the level of the small bowel  anastomosis. Meanwhile, the contrast opacified loops terminate in the left abdomen on series 2, image 42. The staple line. No more distal bowel contrast is present. No abdominal free fluid or free air identified.  There is an enteric tube in place which courses from the distal esophagus into the gastric pouch and just through the gastrojejunostomy (terminating on series 2, image 27).  Negative liver, gallbladder, spleen, pancreas, adrenal glands, and and portal venous system. Negative kidneys except for small low density areas which are probably benign cysts and left lower pole nonobstructing nephrolithiasis.  Dilated infrarenal abdominal aorta up to 15 mm with mural plaque or thrombus. Enlargement of the artery extends to the aortoiliac bifurcation, and the right common iliac artery is enlarged up to 28 mm diameter, the left up to 20 mm. There is a fusiform enlargement of the left internal iliac artery up to 23 mm diameter. Major arterial structures in the abdomen and pelvis remain patent.  IMPRESSION: 1. Status post Roux-en-Y type gastric bypass with obstructed but opacified small bowel loops contiguous with the pancreaticobiliary loop, and also air and fluid distension of the bypassed portion of the stomach.  The gastrojejunostomy is patent, but contrast in the GJ loops abruptly terminates near the small bowel anastomosis in the left abdomen. The more distal small bowel is also dilated with a gradual transition to more normal caliber terminal ileum.  2. No free fluid or free air in the abdomen.  3. Infrarenal abdominal aortic aneurysm measuring up to 15 mm diameter. 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 Recommend follow up by abdomen and pelvis CTA in 3-6 months, and consider vascular surgery referral/consultation if not already obtained. This recommendation follows ACR consensus guidelines: White Paper of the ACR Incidental Findings Committee II on Vascular Findings. J Am Coll  Radiol 2013; 10:789-794.  Electronically Signed: By: Augusto Gamble M.D. On: 03/05/2013 23:41   Dg Abd Acute W/chest  03/05/2013   CLINICAL DATA:  52 year old male with nausea vomiting diarrhea.  EXAM: ACUTE ABDOMEN SERIES (ABDOMEN 2 VIEW & CHEST 1 VIEW)  COMPARISON:  None.  FINDINGS: Mild cardiomegaly. Other mediastinal contours are within normal limits. Previous median sternotomy. Lungs are clear. No pneumothorax or pneumoperitoneum.  Dilated gas-filled small bowel loops with air-fluid levels. Small bowel measures up to 48 mm diameter. There is some colonic gas present. There are staple lines and surgical clips in the left abdomen.  No acute osseous abnormality identified.  IMPRESSION: 1. Acute small bowel obstruction, perhaps due to adhesions given evidence of previous left abdominal surgery. No pneumoperitoneum.  2. No acute cardiopulmonary abnormality.   Electronically Signed   By: Augusto Gamble M.D.   On: 03/05/2013 20:40    Assessment/Plan Active Problems:   SBO (small bowel obstruction)   History of gastric bypass   AAA (abdominal aortic aneurysm) - 5cm infrarenal by CTscan [id number]   SBO -Management as per surgery. -Already on clear liquids.  H/o CAD -S/p CABG. -He is regularly followed by his cardiologist in [place]. -He knows about his wall motion abnormalities (not new). -Continue coreg/lisinopril/ASA/statin. -Is on appropriate medication regimen and feel like nothing needs to be added.  HTN -Fair control. -Do not anticipate need for medication changes while in the hospital.  Will not plan to follow this patient further while in the hospital. Please call us back with questions.   Time Spent on Consultation: 81  minutes  HERNANDEZ ACOSTA,ESTELA [facility]  [phone] 03/07/2013, 6:42 PM

## 2013-03-07 NOTE — Progress Notes (Signed)
Degree retrying clamping trials. Usually resolves relatively well. However, gastric remnant was rather bloated so I do not want to rush to by mouth. Patient anxious to get eating right away. We will try and compromise with a clamping trial.

## 2013-03-08 DIAGNOSIS — E876 Hypokalemia: Secondary | ICD-10-CM

## 2013-03-08 LAB — CBC
HCT: 27.9 % — ABNORMAL LOW (ref 39.0–52.0)
Hemoglobin: 9.1 g/dL — ABNORMAL LOW (ref 13.0–17.0)
MCH: 24.9 pg — ABNORMAL LOW (ref 26.0–34.0)
MCHC: 32.6 g/dL (ref 30.0–36.0)
MCV: 76.4 fL — ABNORMAL LOW (ref 78.0–100.0)
RDW: 19.4 % — ABNORMAL HIGH (ref 11.5–15.5)

## 2013-03-08 LAB — BASIC METABOLIC PANEL
BUN: 12 mg/dL (ref 6–23)
CO2: 28 mEq/L (ref 19–32)
Calcium: 8.5 mg/dL (ref 8.4–10.5)
Creatinine, Ser: 1.12 mg/dL (ref 0.50–1.35)
GFR calc Af Amer: 86 mL/min — ABNORMAL LOW (ref >90)
GFR calc non Af Amer: 74 mL/min — ABNORMAL LOW (ref >90)
Glucose, Bld: 83 mg/dL (ref 70–99)
Potassium: 3.1 mEq/L — ABNORMAL LOW (ref 3.5–5.1)
Sodium: 142 mEq/L (ref 135–145)

## 2013-03-08 MED ORDER — POTASSIUM CHLORIDE CRYS ER 20 MEQ PO TBCR
20.0000 meq | EXTENDED_RELEASE_TABLET | Freq: Once | ORAL | Status: DC
  Filled 2013-03-08: qty 1

## 2013-03-08 MED ORDER — PANTOPRAZOLE SODIUM 40 MG PO TBEC
40.0000 mg | DELAYED_RELEASE_TABLET | Freq: Two times a day (BID) | ORAL | Status: DC
  Administered 2013-03-09: 40 mg via ORAL
  Filled 2013-03-08 (×3): qty 1

## 2013-03-08 MED ORDER — POTASSIUM CHLORIDE CRYS ER 20 MEQ PO TBCR
20.0000 meq | EXTENDED_RELEASE_TABLET | Freq: Two times a day (BID) | ORAL | Status: DC
  Administered 2013-03-08 (×2): 20 meq via ORAL
  Filled 2013-03-08 (×4): qty 1

## 2013-03-08 NOTE — Discharge Summary (Signed)
Physician Discharge Summary  Patient ID: Daniel Blevins MRN: [id number] DOB/AGE: [birth year] 52 y.o.  Admit date: 03/05/2013 Discharge date: 03/09/2013  Admission Diagnoses: Abdominal pain with bowel obstruction./ Prior history of Roux-en-Y gastric bypass Etoh abuse now in Detox at [facility] Hx of CABG  Hypertension Currently at [facility] for ETOH use. Renal Insuffiencey (creatinine 1.66/GFR 53) Chronic hypokalemia     Discharge Diagnoses:  Small bowel obstruction, H/o gastric bypass, r/o internal hernia.  Hx of CABG  Cardiomyopathy with EF 40-45%- Stable  Infrarenal abdominal aortic aneurysm measuring up to 15 mm diameter. 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  Chronic Hypokalemia on Potassium supplement Renal Insuffiencey (creatinine 1.66/GFR 53 on admission)  Active Problems:   SBO (small bowel obstruction)   History of gastric bypass   AAA (abdominal aortic aneurysm) - 5cm infrarenal by CTscan [id number]   PROCEDURES: S/p LAPAROSCOPY DIAGNOSTIC, LAPAROSCOPIC LYSIS OF ADHESIONS, CLOSURE OF INTERNAL, HERNIAS x2; Ardeth Sportsman, MD, 03/06/2013.   Hospital Course: this patient presents for evaluation of abdominal pain which began yesterday. He has pain across his upper mid abdomen with associated nausea and one episode of nonbilious vomiting. He denies any fevers or chills and says that his bowels have been functioning he denies any hematemesis or blood in the stools. He does have a history of a gastric bypass performed in [place] 3 years ago and he has lost about 120 pounds since his procedure. He has been doing fine until this acute event beginning yesterday. He is down here and [facility] since he enrolled himself in an alcoholic detoxification center. He was drinking about a 1/5th per day prior to detox.   Work up and CT in the ER shows:  Status post Roux-en-Y  type gastric bypass with obstructed but opacified small bowel loops contiguous with the pancreaticobiliary loop, and also air and fluid distension of the bypassed portion of the stomach. The gastrojejunostomy is patent, but contrast in the GJ loops abruptly terminates near the small bowel anastomosis in the left abdomen. The more distal small bowel is also dilated with a gradual transition to more normal caliber terminal ileum.  No free fluid or free air in the abdomen. Infrarenal abdominal aortic aneurysm measuring up to 15 mm diameter. 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   After evaluation and being seen he was admitted by Dr. Biagio Quint, and taken later to the OR by Dr. Michaell Cowing. DR Michaell Cowing found adhesions and 2 areas of probable internal hernia.  This was treated, and no bowel resection was not required. He returned to the floor and was maintained on NG suction.  He had very active return of his bowel function, and his diet was advanced. He was kept on his BP medications and Librium at the lowest dose and Seroquel for his Detox care.  A 2D Echo was ordered by Anesthesia, but was not done till after his surgery.  This showed a reduced EF, and he was maintained on his preadmission Coreg.  Dr. Ardyth Harps of the Medicine service saw him and it was her opinion he was adequately treated already, and no further intervention was needed.  His K+ was low on 03/08/13 and this is being replaced.  He is up to a low fiber diet, and if doing well can go back to [facility] in the AM.   His primary care is in [place] where he normally resides. His potassium was low  and this has been replaced.  He has chronic low potassium and has been on a supplement for some time.  We have placed him back supplements, his Magnesium is normal, but I place him on some low dose magnesium till he returns to his primary care. He can follow up with Dr. Michaell Cowing in 2-3 weeks he should still be here the program  in [facility] is 28 days.  Condition on D/C:  Improving   Disposition: Final discharge disposition not confirmed     Medication List    STOP taking these medications       chlordiazePOXIDE 10 MG capsule  Commonly known as:  LIBRIUM     doxylamine (Sleep) 25 MG tablet  Commonly known as:  UNISOM     ibuprofen 200 MG tablet  Commonly known as:  ADVIL,MOTRIN      TAKE these medications       acetaminophen 325 MG tablet  Commonly known as:  TYLENOL  Take 2 tablets (650 mg total) by mouth every 6 (six) hours as needed.     amLODipine 5 MG tablet  Commonly known as:  NORVASC  Take 5 mg by mouth daily.     aspirin 81 MG tablet  Take 81 mg by mouth daily.     carvedilol 25 MG tablet  Commonly known as:  COREG  Take 25 mg by mouth 3 (three) times daily with meals.     cholecalciferol 1000 UNITS tablet  Commonly known as:  VITAMIN D  Take 2,000 Units by mouth daily.     lisinopril 40 MG tablet  Commonly known as:  PRINIVIL,ZESTRIL  Take 40 mg by mouth daily.     magnesium oxide 400 (241.3 MG) MG tablet  Commonly known as:  MAG-OX  Take 1 tablet (400 mg total) by mouth daily.     potassium chloride SA 20 MEQ tablet  Commonly known as:  K-DUR,KLOR-CON  Take 20 mEq by mouth 3 (three) times daily.     simvastatin 20 MG tablet  Commonly known as:  ZOCOR  Take 20 mg by mouth every evening.       Follow-up Information   Follow up with GROSS,STEVEN C., MD. Schedule an appointment as soon as possible for a visit in 2 weeks.   Specialty:  General Surgery   Contact information:   [address] [place] [address] [phone]       Follow up with Recheck labs on Monday. (Have his BMP and magnesium drawn on Monday 03/12/13 and follow up with Primary Care or his Cardiologist.)       Signed: Sherrie George 03/09/2013, 4:19 PM

## 2013-03-08 NOTE — Progress Notes (Signed)
Making improvement today.  Just trying solid diet.  Having flatus and bowel movements.  Denies much pain.  Walking well.  Incisions are clean. Abdomen soft.  Anticipate he will be able to be discharged tomorrow AM if he continues to improve  D/C patient from hospital when patient meets criteria (anticipate in 1 day):  Tolerating oral intake well Ambulating in walkways Adequate pain control without IV medications Urinating  Having flatus

## 2013-03-08 NOTE — Progress Notes (Signed)
2 Days Post-Op  Subjective: He feels great and says he's had 2 Bowel movements this AM.  He want some real food.  Objective: Vital signs in last 24 hours: Temp:  [97.9 F (36.6 C)-98.3 F (36.8 C)] 98.2 F (36.8 C) (11/06 1035) Pulse Rate:  [55-79] 59 (11/06 1035) Resp:  [18] 18 (11/06 1035) BP: (114-160)/(74-98) 130/87 mmHg (11/06 1035) SpO2:  [92 %-98 %] 98 % (11/06 0830) Last BM Date: 03/04/13 240 PO yesterday and 480 PO today so far. Afebrile, VSS (BP up some) Lab show low K+, and low platelet count. Intake/Output from previous day: 11/05 0701 - 11/06 0700 In: 2868.3 [P.O.:240; I.V.:2458.3; NG/GT:170] Out: 5250 [Urine:5250] Intake/Output this shift: Total I/O In: 480 [P.O.:480] Out: -   General appearance: alert, cooperative and no distress Resp: clear to auscultation bilaterally GI: soft sore, few Bs, incisions all dry.  no distension.  Lab Results:   Recent Labs  03/07/13 0417 03/08/13 0410  WBC 4.7 4.6  HGB 8.7* 9.1*  HCT 27.2* 27.9*  PLT 135* 123*    BMET  Recent Labs  03/07/13 0417 03/08/13 0410  NA 139 142  K 3.9 3.1*  CL 105 105  CO2 26 28  GLUCOSE 138* 83  BUN 21 12  CREATININE 1.21 1.12  CALCIUM 8.3* 8.5   PT/INR No results found for this basename: LABPROT, INR,  in the last 72 hours   Recent Labs Lab 03/05/13 1833  AST 30  ALT 21  ALKPHOS 59  BILITOT 0.5  PROT 7.9  ALBUMIN 3.7     Lipase     Component Value Date/Time   LIPASE 194* 03/05/2013 1833     Studies/Results: No results found.  Medications: . amLODipine  5 mg Oral Daily  . carvedilol  25 mg Oral TID WC  . chlordiazePOXIDE  5 mg Oral QID  . folic acid  1 mg Oral Daily  . heparin  5,000 Units Subcutaneous Q8H  . lip balm  1 application Topical BID  . LORazepam  0-4 mg Intravenous Q6H   Followed by  . LORazepam  0-4 mg Intravenous Q12H  . multivitamin with minerals  1 tablet Oral Daily  . pantoprazole (PROTONIX) IV  40 mg Intravenous Q12H  . QUEtiapine  50  mg Oral QHS  . thiamine  100 mg Oral Daily   Or  . thiamine  100 mg Intravenous Daily    Assessment/Plan Small bowel obstruction, H/o gastric bypass, r/o internal hernia.  S/p LAPAROSCOPY DIAGNOSTIC, LAPAROSCOPIC LYSIS OF ADHESIONS, CLOSURE OF INTERNAL, HERNIAS x2; Ardeth Sportsman, MD, 03/06/2013.  Hx of CABG  Cardiomyopathy with EF 40-45%- Stable Hypertension Currently at [facility] for ETOH use. Hypokalemia   Plan:  Low fiber diet now and if OK back to Rehab in AM.  Will give him some KCl today also.  LOS: 3 days    Angeletta Goelz 03/08/2013

## 2013-03-08 NOTE — Progress Notes (Signed)
CSW assisting with d/c planning. Clinicals have been sent to [facility] for review by DON. Message left for DON to contact CSW to assist with d/c planning back to [facility] tomorrow. Awaiting return call. CSW will continue to follow to assist with d/c planning needs.  Cori Razor LCSW [phone]

## 2013-03-08 NOTE — Progress Notes (Signed)
PHARMACIST - PHYSICIAN COMMUNICATION DR:   Michaell Cowing CONCERNING: Protonix IV to Oral Route Change Policy  RECOMMENDATION: This patient is receiving Protonix by the intravenous route.  Based on criteria approved by the Pharmacy and Therapeutics Committee, it is being converted to the equivalent oral dose form(s).   DESCRIPTION: These criteria include:  The patient does not exhibit a GI malabsorption state  The patient is eating (either orally or via tube) and/or has been taking other orally administered medications for a least 24 hours  The patient is improving clinically  If you have questions about this conversion, please contact the Pharmacy Department  [x]   [phone] )  [facility] PharmD, [place] Pager [phone] 03/08/2013 2:06 PM

## 2013-03-08 NOTE — Care Management Note (Signed)
    Page 1 of 1   03/08/2013     12:23:26 PM   CARE MANAGEMENT NOTE 03/08/2013  Patient:  Daniel Blevins, Daniel Blevins   Account Number:  [id number]  Date Initiated:  03/06/2013  Documentation initiated by:  Lorenda Ishihara  Subjective/Objective Assessment:   52 yo male admitted s/p lap lysis for bowel obstruction.     Action/Plan:   Anticipated DC Date:  03/09/2013   Anticipated DC Plan:  IP REHAB FACILITY  In-house referral  Clinical Social Worker      DC Planning Services  CM consult      Choice offered to / List presented to:             Status of service:  Completed, signed off Medicare Important Message given?   (If response is "NO", the following Medicare IM given date fields will be blank) Date Medicare IM given:   Date Additional Medicare IM given:    Discharge Disposition:  IP REHAB FACILITY  Per UR Regulation:  Reviewed for med. necessity/level of care/duration of stay  If discussed at Long Length of Stay Meetings, dates discussed:    Comments:

## 2013-03-09 LAB — BASIC METABOLIC PANEL WITH GFR
BUN: 20 mg/dL (ref 6–23)
CO2: 26 meq/L (ref 19–32)
Calcium: 9.2 mg/dL (ref 8.4–10.5)
Chloride: 102 meq/L (ref 96–112)
Creatinine, Ser: 1.64 mg/dL — ABNORMAL HIGH (ref 0.50–1.35)
GFR calc Af Amer: 54 mL/min — ABNORMAL LOW
GFR calc non Af Amer: 47 mL/min — ABNORMAL LOW
Glucose, Bld: 171 mg/dL — ABNORMAL HIGH (ref 70–99)
Potassium: 3.4 meq/L — ABNORMAL LOW (ref 3.5–5.1)
Sodium: 140 meq/L (ref 135–145)

## 2013-03-09 LAB — BASIC METABOLIC PANEL
BUN: 15 mg/dL (ref 6–23)
CO2: 28 mEq/L (ref 19–32)
Calcium: 8.9 mg/dL (ref 8.4–10.5)
Chloride: 104 mEq/L (ref 96–112)
Creatinine, Ser: 1.22 mg/dL (ref 0.50–1.35)
GFR calc Af Amer: 77 mL/min — ABNORMAL LOW (ref >90)
GFR calc non Af Amer: 67 mL/min — ABNORMAL LOW (ref >90)
Glucose, Bld: 83 mg/dL (ref 70–99)
Potassium: 2.9 mEq/L — ABNORMAL LOW (ref 3.5–5.1)

## 2013-03-09 LAB — MAGNESIUM: Magnesium: 1.8 mg/dL (ref 1.5–2.5)

## 2013-03-09 MED ORDER — ASPIRIN 81 MG PO TABS: 81.0000 mg | ORAL_TABLET | Freq: Every day | ORAL | Status: DC

## 2013-03-09 MED ORDER — LISINOPRIL 40 MG PO TABS
40.0000 mg | ORAL_TABLET | Freq: Every day | ORAL | Status: DC
  Administered 2013-03-09: 40 mg via ORAL
  Filled 2013-03-09: qty 1

## 2013-03-09 MED ORDER — POTASSIUM CHLORIDE CRYS ER 20 MEQ PO TBCR
40.0000 meq | EXTENDED_RELEASE_TABLET | Freq: Three times a day (TID) | ORAL | Status: DC
  Administered 2013-03-09: 40 meq via ORAL
  Filled 2013-03-09 (×3): qty 2

## 2013-03-09 MED ORDER — ACETAMINOPHEN 325 MG PO TABS: 650.0000 mg | ORAL_TABLET | Freq: Four times a day (QID) | ORAL | Status: DC | PRN

## 2013-03-09 MED ORDER — CHLORDIAZEPOXIDE HCL 5 MG PO CAPS: 5.0000 mg | ORAL_CAPSULE | Freq: Three times a day (TID) | ORAL | Status: DC

## 2013-03-09 MED ORDER — MAGNESIUM OXIDE 400 (241.3 MG) MG PO TABS
400.0000 mg | ORAL_TABLET | Freq: Every day | ORAL | Status: DC
  Administered 2013-03-09: 400 mg via ORAL
  Filled 2013-03-09: qty 1

## 2013-03-09 MED ORDER — ASPIRIN EC 81 MG PO TBEC
81.0000 mg | DELAYED_RELEASE_TABLET | Freq: Every day | ORAL | Status: DC
  Administered 2013-03-09: 81 mg via ORAL
  Filled 2013-03-09: qty 1

## 2013-03-09 MED ORDER — CHLORDIAZEPOXIDE HCL 5 MG PO CAPS
5.0000 mg | ORAL_CAPSULE | Freq: Three times a day (TID) | ORAL | Status: DC
  Filled 2013-03-09: qty 1

## 2013-03-09 MED ORDER — OXYCODONE HCL 5 MG PO TABS: 5.0000 mg | ORAL_TABLET | ORAL | Status: DC | PRN

## 2013-03-09 MED ORDER — MAGNESIUM OXIDE 400 (241.3 MG) MG PO TABS: 400.0000 mg | ORAL_TABLET | Freq: Every day | ORAL | Status: DC

## 2013-03-09 MED ORDER — SIMVASTATIN 20 MG PO TABS
20.0000 mg | ORAL_TABLET | Freq: Every evening | ORAL | Status: DC
  Filled 2013-03-09: qty 1

## 2013-03-09 NOTE — Progress Notes (Signed)
Pt will be d/c back to [facility] today. Transportation will be provided by [facility].  Cori Razor LCSW [phone]

## 2013-03-09 NOTE — Progress Notes (Addendum)
3 Days Post-Op  Subjective: He looks great, he was suppose to get a third dose of KCL last PM and that didn't occur, no one know's why.  He has chronic low K+ issues and is on it TID at home.  Eating and having normal BM.  Very little discomfort.  No anxiety of issues with Detox. Still on low dose librium qid.  Objective: Vital signs in last 24 hours: Temp:  [97.9 F (36.6 C)-98.4 F (36.9 C)] 97.9 F (36.6 C) (11/07 0609) Pulse Rate:  [59-76] 62 (11/07 0609) Resp:  [18] 18 (11/07 0609) BP: (120-146)/(74-95) 146/95 mmHg (11/07 0609) SpO2:  [96 %-97 %] 97 % (11/07 0609) Last BM Date: 03/08/13 1560 PO recorded, 3 BM recorded Afebrile, VSS, BP up a couple times K+ down more creatinine is stable He got 2 of the 3 dose ordered Intake/Output from previous day: 11/06 0701 - 11/07 0700 In: 2828.8 [P.O.:1560; I.V.:1268.8] Out: 300 [Urine:300] Intake/Output this shift:    General appearance: alert, cooperative and no distress Resp: clear to auscultation bilaterally GI: soft, non-tender; bowel sounds normal; no masses,  no organomegaly and he still has some soreness from abdomen, but very little discomfort.  port sites look fine.  Lab Results:   Recent Labs  03/07/13 0417 03/08/13 0410  WBC 4.7 4.6  HGB 8.7* 9.1*  HCT 27.2* 27.9*  PLT 135* 123*    BMET  Recent Labs  03/08/13 0410 03/09/13 0415  NA 142 141  K 3.1* 2.9*  CL 105 104  CO2 28 28  GLUCOSE 83 83  BUN 12 15  CREATININE 1.12 1.22  CALCIUM 8.5 8.9   PT/INR No results found for this basename: LABPROT, INR,  in the last 72 hours   Recent Labs Lab 03/05/13 1833  AST 30  ALT 21  ALKPHOS 59  BILITOT 0.5  PROT 7.9  ALBUMIN 3.7     Lipase     Component Value Date/Time   LIPASE 194* 03/05/2013 1833     Studies/Results: No results found.  Medications: . amLODipine  5 mg Oral Daily  . carvedilol  25 mg Oral TID WC  . chlordiazePOXIDE  5 mg Oral QID  . folic acid  1 mg Oral Daily  . heparin   5,000 Units Subcutaneous Q8H  . lip balm  1 application Topical BID  . LORazepam  0-4 mg Intravenous Q12H  . multivitamin with minerals  1 tablet Oral Daily  . pantoprazole  40 mg Oral BID  . potassium chloride  20 mEq Oral BID WC  . potassium chloride  20 mEq Oral Once  . QUEtiapine  50 mg Oral QHS  . thiamine  100 mg Oral Daily   Or  . thiamine  100 mg Intravenous Daily   Prior to Admission medications   Medication Sig Start Date End Date Taking? Authorizing Provider  amLODipine (NORVASC) 5 MG tablet Take 5 mg by mouth daily.   Yes Historical Provider, MD  aspirin 81 MG tablet Take 81 mg by mouth daily.   Yes Historical Provider, MD  carvedilol (COREG) 25 MG tablet Take 25 mg by mouth 3 (three) times daily with meals.   Yes Historical Provider, MD  chlordiazePOXIDE (LIBRIUM) 10 MG capsule Take 20 mg by mouth 2 (two) times daily.   Yes Historical Provider, MD  cholecalciferol (VITAMIN D) 1000 UNITS tablet Take 2,000 Units by mouth daily.   Yes Historical Provider, MD  doxylamine, Sleep, (UNISOM) 25 MG tablet Take 25 mg by  mouth at bedtime.   Yes Historical Provider, MD  ibuprofen (ADVIL,MOTRIN) 200 MG tablet Take 200 mg by mouth every 6 (six) hours as needed for pain.   Yes Historical Provider, MD  lisinopril (PRINIVIL,ZESTRIL) 40 MG tablet Take 40 mg by mouth daily.   Yes Historical Provider, MD  potassium chloride SA (K-DUR,KLOR-CON) 20 MEQ tablet Take 20 mEq by mouth 3 (three) times daily.   Yes Historical Provider, MD  simvastatin (ZOCOR) 20 MG tablet Take 20 mg by mouth every evening.   Yes Historical Provider, MD     Assessment/Plan Small bowel obstruction, H/o gastric bypass, r/o internal hernia.  S/p LAPAROSCOPY DIAGNOSTIC, LAPAROSCOPIC LYSIS OF ADHESIONS, CLOSURE OF INTERNAL, HERNIAS x2; Ardeth Sportsman, MD, 03/06/2013.  Hx of CABG  Cardiomyopathy with EF 40-45%- Stable  Hypertension Currently at [facility] for ETOH use.  Hypokalemia Ongoing and  chronic on supplements tid at home. Mild Renal Insuffiencey GFR 54   Plan:  Recheck Mag and replace if needed, continue TID KCL and increase AM dose to .  If Mag is good I will send home on K+ as before.  If low I will replace and then send home on K+. Restart remainder of his home meds as before admission. MAJOR problem is returning him to [facility] with low K+.  I will give him 2 doses and recheck K+ at 1400.  IF they will not take him today we will try to get him back tomorrow. We have talked and he is aware of the issue and how we are trying to deal with it.  basic metabolic panel K+ up to 3.4, BUN 20, Creatinine 1.64.  If he can go back to [facility] we will send him this afternoon.  Case manager and Social worker are both working on this.   LOS: 4 days    Leva Baine 03/09/2013

## 2013-03-09 NOTE — Discharge Summary (Signed)
Markedly improved since his nasogastric tube out.  Walking well.  Tolerating solids.  Having flatus.  Hypokalemia corrected.  Okay to return to the alcohol detoxification center

## 2013-03-09 NOTE — Progress Notes (Signed)
Hopefully with potassium corrected can be discharged back to his rehabilitation/detox therapy

## 2013-03-09 NOTE — Progress Notes (Signed)
ATTENDING ADDENDUM:  I personally reviewed patient's record, examined the patient, and formulated the following assessment and plan:  Doing well.  Agree with PA plan for possible discharge

## 2013-12-20 ENCOUNTER — Other Ambulatory Visit: Payer: Self-pay | Admitting: Physician Assistant

## 2013-12-20 DIAGNOSIS — S99919A Unspecified injury of unspecified ankle, initial encounter: Principal | ICD-10-CM

## 2013-12-20 DIAGNOSIS — S8990XA Unspecified injury of unspecified lower leg, initial encounter: Secondary | ICD-10-CM

## 2013-12-20 DIAGNOSIS — S99929A Unspecified injury of unspecified foot, initial encounter: Principal | ICD-10-CM

## 2013-12-25 ENCOUNTER — Other Ambulatory Visit: Payer: BC Managed Care – PPO

## 2013-12-26 ENCOUNTER — Other Ambulatory Visit: Payer: BC Managed Care – PPO

## 2013-12-27 ENCOUNTER — Other Ambulatory Visit: Payer: Self-pay | Admitting: Physician Assistant

## 2013-12-30 ENCOUNTER — Other Ambulatory Visit: Payer: BC Managed Care – PPO

## 2014-01-04 ENCOUNTER — Other Ambulatory Visit: Payer: BC Managed Care – PPO

## 2014-01-04 ENCOUNTER — Ambulatory Visit
Admission: RE | Admit: 2014-01-04 | Discharge: 2014-01-04 | Disposition: A | Discharge status: Home / Self Care | Payer: 59 | Source: Ambulatory Visit | Attending: Physician Assistant | Admitting: Physician Assistant

## 2014-01-04 DIAGNOSIS — S8990XA Unspecified injury of unspecified lower leg, initial encounter: Secondary | ICD-10-CM

## 2014-01-04 DIAGNOSIS — S99929A Unspecified injury of unspecified foot, initial encounter: Principal | ICD-10-CM

## 2014-01-04 DIAGNOSIS — S99919A Unspecified injury of unspecified ankle, initial encounter: Principal | ICD-10-CM

## 2014-02-01 ENCOUNTER — Other Ambulatory Visit: Payer: Self-pay | Admitting: Urology

## 2014-02-04 ENCOUNTER — Encounter (HOSPITAL_COMMUNITY): Payer: Self-pay | Admitting: Pharmacy Technician

## 2014-02-04 NOTE — Patient Instructions (Addendum)
Daniel Blevins  02/04/2014   Your procedure is scheduled on:  02/07/2014   Report to Emergency Department Entrance  Follow the Signs to [place] at [place] am  Call this number if you have problems the morning of surgery: [phone]   Remember:   Do not eat food or drink liquids after midnight.   Take these medicines the morning of surgery with A SIP OF WATER: amlodipine, carvedilol, simvastatin   Do not wear jewelry.  Do not wear lotions, powders, or perfumes.  deodorant.   Men may shave face and neck.  Do not bring valuables to the hospital.  Contacts, dentures or bridgework may not be worn into surgery.    Patients discharged the day of surgery will not be allowed to drive home.  Name and phone number of your driver: [name] (wife) [phone]            [facility] - Preparing for Surgery Before surgery, you can play an important role.  Because skin is not sterile, your skin needs to be as free of germs as possible.  You can reduce the number of germs on your skin by washing with CHG (chlorahexidine gluconate) soap before surgery.  CHG is an antiseptic cleaner which kills germs and bonds with the skin to continue killing germs even after washing. Please DO NOT use if you have an allergy to CHG or antibacterial soaps.  If your skin becomes reddened/irritated stop using the CHG and inform your nurse when you arrive at Short Stay. Do not shave (including legs and underarms) for at least 48 hours prior to the first CHG shower.  You may shave your face/neck. Please follow these instructions carefully:  1.  Shower with CHG Soap the night before surgery and the  morning of Surgery.  2.  If you choose to wash your hair, wash your hair first as usual with your  normal  shampoo.  3.  After you shampoo, rinse your hair and body thoroughly to remove the  shampoo.                            4.  Use CHG as you would any other liquid soap.  You can apply chg directly  to the skin and wash                    Gently with a scrungie or clean washcloth.  5.  Apply the CHG Soap to your body ONLY FROM THE NECK DOWN.   Do not use on face/ open                           Wound or open sores. Avoid contact with eyes, ears mouth and genitals (private parts).                       Wash face,  Genitals (private parts) with your normal soap.             6.  Wash thoroughly, paying special attention to the area where your surgery  will be performed.  7.  Thoroughly rinse your body with warm water from the neck down.  8.  DO NOT shower/wash with your normal soap after using and rinsing off  the CHG Soap.                9.  Pat yourself dry  with a clean towel.            10.  Wear clean pajamas.            11.  Place clean sheets on your bed the night of your first shower and do not  sleep with pets. Day of Surgery : Do not apply any lotions/deodorants the morning of surgery.  Please wear clean clothes to the hospital/surgery center.  FAILURE TO FOLLOW THESE INSTRUCTIONS MAY RESULT IN THE CANCELLATION OF YOUR SURGERY PATIENT SIGNATURE_________________________________  NURSE SIGNATURE__________________________________  ________________________________________________________________________

## 2014-02-05 ENCOUNTER — Encounter (HOSPITAL_COMMUNITY)
Admission: RE | Admit: 2014-02-05 | Discharge: 2014-02-05 | Disposition: A | Discharge status: Home / Self Care | Payer: Managed Care, Other (non HMO) | Source: Ambulatory Visit | Attending: Urology | Admitting: Urology

## 2014-02-05 ENCOUNTER — Encounter (HOSPITAL_COMMUNITY): Payer: Self-pay

## 2014-02-05 DIAGNOSIS — N201 Calculus of ureter: Secondary | ICD-10-CM | POA: Diagnosis present

## 2014-02-05 DIAGNOSIS — I2510 Atherosclerotic heart disease of native coronary artery without angina pectoris: Secondary | ICD-10-CM | POA: Diagnosis not present

## 2014-02-05 DIAGNOSIS — Z951 Presence of aortocoronary bypass graft: Secondary | ICD-10-CM | POA: Diagnosis not present

## 2014-02-05 DIAGNOSIS — Z9884 Bariatric surgery status: Secondary | ICD-10-CM | POA: Diagnosis not present

## 2014-02-05 DIAGNOSIS — I10 Essential (primary) hypertension: Secondary | ICD-10-CM | POA: Diagnosis not present

## 2014-02-05 DIAGNOSIS — H918X1 Other specified hearing loss, right ear: Secondary | ICD-10-CM | POA: Diagnosis not present

## 2014-02-05 HISTORY — DX: Personal history of urinary calculi: Z87.442

## 2014-02-05 HISTORY — DX: Unspecified hearing loss, right ear: H91.91

## 2014-02-05 HISTORY — DX: Atherosclerotic heart disease of native coronary artery without angina pectoris: I25.10

## 2014-02-05 LAB — BASIC METABOLIC PANEL
ANION GAP: 11 (ref 5–15)
BUN: 17 mg/dL (ref 6–23)
CALCIUM: 9 mg/dL (ref 8.4–10.5)
CO2: 29 mEq/L (ref 19–32)
Chloride: 102 mEq/L (ref 96–112)
Creatinine, Ser: 1.23 mg/dL (ref 0.50–1.35)
GFR calc Af Amer: 76 mL/min — ABNORMAL LOW (ref >90)
GFR, EST NON AFRICAN AMERICAN: 65 mL/min — AB (ref >90)
Glucose, Bld: 85 mg/dL (ref 70–99)
Potassium: 3.6 mEq/L — ABNORMAL LOW (ref 3.7–5.3)
SODIUM: 142 meq/L (ref 137–147)

## 2014-02-05 LAB — CBC
HCT: 36.1 % — ABNORMAL LOW (ref 39.0–52.0)
Hemoglobin: 11.6 g/dL — ABNORMAL LOW (ref 13.0–17.0)
MCH: 25.2 pg — AB (ref 26.0–34.0)
MCHC: 32.1 g/dL (ref 30.0–36.0)
MCV: 78.5 fL (ref 78.0–100.0)
PLATELETS: 114 10*3/uL — AB (ref 150–400)
RBC: 4.6 MIL/uL (ref 4.22–5.81)
RDW: 14.7 % (ref 11.5–15.5)
WBC: 4.1 10*3/uL (ref 4.0–10.5)

## 2014-02-05 NOTE — Progress Notes (Signed)
Chest x-ray 03/05/13 on EPIC, EKG 03/06/13 on EPIC, ECHO 03/06/13 on EPIC

## 2014-02-05 NOTE — Progress Notes (Signed)
02/05/14 1026  OBSTRUCTIVE SLEEP APNEA  Have you ever been diagnosed with sleep apnea through a sleep study? No  Do you snore loudly (loud enough to be heard through closed doors)?  0  Do you often feel tired, fatigued, or sleepy during the daytime? 0  Has anyone observed you stop breathing during your sleep? 0  Do you have, or are you being treated for high blood pressure? 1  BMI more than 35 kg/m2? 0  Age over 53 years old? 1  Neck circumference greater than 40 cm/16 inches? 1  Gender: 1  Obstructive Sleep Apnea Score 4  Score 4 or greater  Results sent to PCP

## 2014-02-06 NOTE — H&P (Signed)
Urology History and Physical Exam  CC: Kidney stones  HPI: 53 year old male presents for management of bilateral ureteral calculi. His history is as follows:  He states that for the past year or so, he has been having significant nocturia-fairly suddenly, he started having to wake up 4-5 times a night to urinate. He has no dysuria with this. He has no gross hematuria. He has been found to have microscopic hematuria and has had a thorough evaluation at [facility] Department of urology. He denies significant daytime urinary issues. He denies urgency. He denies any flank or abdominal pain  He apparently had a CT of the abdomen and pelvis in August, 2014. This revealed a 2.5 mm calculus in the right mid ureter with mild right hydroureteronephrosis, a nonobstructing stone in the interpolar right renal system, a 3 mm stone at the left distal ureter with more proximal dilatation and a 4.5 mm stone at the level of the left UVJ. There was mural thickening and hyper enhancement at the level of the left ureteral orifice. On the delayed excretory phase images, there were small filling defects in the lower pole collecting system of the right kidney. 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 Maximum diameter is 6.1 cm.  Dr. Carlota Raspberry performed cystoscopy on the patient. I do not have records from that. It was recommended that the patient have intervention for his stones. Since he lives in [place], he would like to have a consultation and treatment here.    PMH: Past Medical History  Diagnosis Date  . Hx of CABG   . Hypertension   . 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 Coronary artery disease   . History of kidney stones   . Deafness in right ear     PSH: Past Surgical History  Procedure Laterality Date  . Laparoscopic gastric bypass  2010    [facility]  . Laparoscopy N/A 03/06/2013    Procedure:  LAPAROSCOPY DIAGNOSTIC;  Surgeon: Adin Hector, MD;  Location: [facility] ORS;  Service: General;  Laterality: N/A;  . Laparoscopic lysis of adhesions N/A 03/06/2013    Procedure: LAPAROSCOPIC LYSIS OF ADHESIONS AND CLOSURE OF INTERNAL HERNIAS x2;  Surgeon: Adin Hector, MD;  Location: [facility] ORS;  Service: General;  Laterality: N/A;  . Coronary artery bypass graft  2001  . Cardiac catheterization    . Back surgery  2009    lower back    Allergies: No Known Allergies  Medications: No prescriptions prior to admission     Social History: History   Social History  . Marital Status: Married    Spouse Name: N/A    Number of Children: N/A  . Years of Education: N/A   Occupational History  . Not on file.   Social History Main Topics  . Smoking status: Never Smoker   . Smokeless tobacco: Never Used  . Alcohol Use: No  . Drug Use: No  . Sexual Activity: No   Other Topics Concern  . Not on file   Social History Narrative  . No narrative on file    Family History: No family history on file.  Review of Systems: Genitourinary, constitutional, skin, eye, otolaryngeal, hematologic/lymphatic, cardiovascular, pulmonary, endocrine, musculoskeletal, gastrointestinal, neurological and psychiatric system(s) were reviewed and pertinent findings if present are noted.  Genitourinary: urinary frequency and nocturia.  Musculoskeletal: joint pain.  Physical Exam: @VITALS2 @ General: No acute distress.  Awake. Head:  Normocephalic.  Atraumatic. ENT:  EOMI.  Mucous membranes moist Neck:  Supple.  No lymphadenopathy. CV:  S1 present. S2 present. Regular rate. Pulmonary: Equal effort bilaterally.  Clear to auscultation bilaterally. Abdomen: Soft.  Non tender to palpation. Skin:  Normal turgor.  No visible rash. Extremity: No gross deformity of bilateral upper extremities.  No gross deformity of                             lower extremities. Neurologic: Alert. Appropriate  mood.    Studies:  Recent Labs     02/05/14  1110  HGB  11.6*  WBC  4.1  PLT  114*    Recent Labs     02/05/14  1110  NA  142  K  3.6*  CL  102  CO2  29  BUN  17  CREATININE  1.23  CALCIUM  9.0  GFRNONAA  65*  GFRAA  76*     No results found for this basename: PT, INR, APTT,  in the last 72 hours   No components found with this basename: ABG,     Assessment:  Bilateral ureteral calculi  Plan: Bilateral retrogrades, bilateral ureteroscopic stone extractions

## 2014-02-07 ENCOUNTER — Encounter (HOSPITAL_COMMUNITY): Payer: Managed Care, Other (non HMO) | Admitting: Anesthesiology

## 2014-02-07 ENCOUNTER — Encounter (HOSPITAL_COMMUNITY)
Admission: RE | Disposition: A | Discharge status: Home / Self Care | Payer: Self-pay | Source: Ambulatory Visit | Attending: Urology

## 2014-02-07 ENCOUNTER — Ambulatory Visit (HOSPITAL_COMMUNITY)
Admission: RE | Admit: 2014-02-07 | Discharge: 2014-02-07 | Disposition: A | Discharge status: Home / Self Care | Payer: Managed Care, Other (non HMO) | Source: Ambulatory Visit | Attending: Urology | Admitting: Urology

## 2014-02-07 ENCOUNTER — Encounter (HOSPITAL_COMMUNITY): Payer: Self-pay | Admitting: *Deleted

## 2014-02-07 ENCOUNTER — Ambulatory Visit (HOSPITAL_COMMUNITY): Payer: Managed Care, Other (non HMO) | Admitting: Anesthesiology

## 2014-02-07 DIAGNOSIS — I10 Essential (primary) hypertension: Secondary | ICD-10-CM | POA: Insufficient documentation

## 2014-02-07 DIAGNOSIS — H918X1 Other specified hearing loss, right ear: Secondary | ICD-10-CM | POA: Insufficient documentation

## 2014-02-07 DIAGNOSIS — N201 Calculus of ureter: Secondary | ICD-10-CM | POA: Insufficient documentation

## 2014-02-07 DIAGNOSIS — Z951 Presence of aortocoronary bypass graft: Secondary | ICD-10-CM | POA: Insufficient documentation

## 2014-02-07 DIAGNOSIS — Z9884 Bariatric surgery status: Secondary | ICD-10-CM | POA: Insufficient documentation

## 2014-02-07 DIAGNOSIS — I2510 Atherosclerotic heart disease of native coronary artery without angina pectoris: Secondary | ICD-10-CM | POA: Insufficient documentation

## 2014-02-07 HISTORY — PX: CYSTOSCOPY WITH RETROGRADE PYELOGRAM, URETEROSCOPY AND STENT PLACEMENT: SHX5789

## 2014-02-07 SURGERY — CYSTOURETEROSCOPY, WITH RETROGRADE PYELOGRAM AND STENT INSERTION
Anesthesia: General | Laterality: Bilateral

## 2014-02-07 MED ORDER — FENTANYL CITRATE 0.05 MG/ML IJ SOLN
INTRAMUSCULAR | Status: DC | PRN
  Administered 2014-02-07 (×3): 50 ug via INTRAVENOUS
  Administered 2014-02-07: 100 ug via INTRAVENOUS

## 2014-02-07 MED ORDER — CEPHALEXIN 500 MG PO CAPS: 500.0000 mg | ORAL_CAPSULE | Freq: Four times a day (QID) | ORAL | Status: DC

## 2014-02-07 MED ORDER — HYDROCODONE-ACETAMINOPHEN 5-325 MG PO TABS
1.0000 | ORAL_TABLET | Freq: Once | ORAL | Status: AC
  Administered 2014-02-07: 1 via ORAL
  Filled 2014-02-07: qty 1

## 2014-02-07 MED ORDER — ONDANSETRON HCL 4 MG/2ML IJ SOLN
INTRAMUSCULAR | Status: DC | PRN
  Administered 2014-02-07: 4 mg via INTRAVENOUS

## 2014-02-07 MED ORDER — SODIUM CHLORIDE 0.9 % IR SOLN
Status: DC | PRN
  Administered 2014-02-07: 3000 mL

## 2014-02-07 MED ORDER — MIDAZOLAM HCL 5 MG/5ML IJ SOLN
INTRAMUSCULAR | Status: DC | PRN
  Administered 2014-02-07: 2 mg via INTRAVENOUS

## 2014-02-07 MED ORDER — ONDANSETRON HCL 4 MG/2ML IJ SOLN
INTRAMUSCULAR | Status: AC
  Filled 2014-02-07: qty 2

## 2014-02-07 MED ORDER — 0.9 % SODIUM CHLORIDE (POUR BTL) OPTIME
TOPICAL | Status: DC | PRN
  Administered 2014-02-07: 1000 mL

## 2014-02-07 MED ORDER — CEFAZOLIN SODIUM-DEXTROSE 2-3 GM-% IV SOLR
INTRAVENOUS | Status: AC
  Filled 2014-02-07: qty 50

## 2014-02-07 MED ORDER — HYDROCODONE-ACETAMINOPHEN 5-325 MG PO TABS: 1.0000 | ORAL_TABLET | ORAL | Status: DC | PRN

## 2014-02-07 MED ORDER — LIDOCAINE HCL (CARDIAC) 20 MG/ML IV SOLN
INTRAVENOUS | Status: DC | PRN
  Administered 2014-02-07: 50 mg via INTRAVENOUS

## 2014-02-07 MED ORDER — PROMETHAZINE HCL 25 MG/ML IJ SOLN: 6.2500 mg | INTRAMUSCULAR | Status: DC | PRN

## 2014-02-07 MED ORDER — IOHEXOL 300 MG/ML  SOLN
INTRAMUSCULAR | Status: DC | PRN
  Administered 2014-02-07: 10 mL

## 2014-02-07 MED ORDER — FENTANYL CITRATE 0.05 MG/ML IJ SOLN: 25.0000 ug | INTRAMUSCULAR | Status: DC | PRN

## 2014-02-07 MED ORDER — PROPOFOL 10 MG/ML IV BOLUS
INTRAVENOUS | Status: AC
  Filled 2014-02-07: qty 20

## 2014-02-07 MED ORDER — LACTATED RINGERS IV SOLN
INTRAVENOUS | Status: DC
  Administered 2014-02-07: 09:00:00 via INTRAVENOUS
  Administered 2014-02-07: 1000 mL via INTRAVENOUS

## 2014-02-07 MED ORDER — PROPOFOL 10 MG/ML IV BOLUS
INTRAVENOUS | Status: DC | PRN
  Administered 2014-02-07: 160 mg via INTRAVENOUS

## 2014-02-07 MED ORDER — MIDAZOLAM HCL 2 MG/2ML IJ SOLN
INTRAMUSCULAR | Status: AC
  Filled 2014-02-07: qty 2

## 2014-02-07 MED ORDER — FENTANYL CITRATE 0.05 MG/ML IJ SOLN
INTRAMUSCULAR | Status: AC
  Filled 2014-02-07: qty 5

## 2014-02-07 MED ORDER — CEFAZOLIN SODIUM-DEXTROSE 2-3 GM-% IV SOLR
2.0000 g | INTRAVENOUS | Status: AC
  Administered 2014-02-07: 2 g via INTRAVENOUS

## 2014-02-07 MED ORDER — SODIUM CHLORIDE 0.9 % IR SOLN
Status: DC | PRN
  Administered 2014-02-07: 1000 mL

## 2014-02-07 SURGICAL SUPPLY — 19 items
BAG URO CATCHER STRL LF (DRAPE) ×2 IMPLANT
CATH INTERMIT  6FR 70CM (CATHETERS) ×2 IMPLANT
CLOTH BEACON ORANGE TIMEOUT ST (SAFETY) ×2 IMPLANT
DRAPE CAMERA CLOSED 9X96 (DRAPES) ×2 IMPLANT
DRAPE LEGGING IMPERMEABLE (DRAPES) ×2 IMPLANT
FIBER LASER FLEXIVA 1000 (UROLOGICAL SUPPLIES) IMPLANT
FIBER LASER FLEXIVA 200 (UROLOGICAL SUPPLIES) IMPLANT
FIBER LASER FLEXIVA 365 (UROLOGICAL SUPPLIES) IMPLANT
FIBER LASER FLEXIVA 550 (UROLOGICAL SUPPLIES) IMPLANT
FIBER LASER TRAC TIP (UROLOGICAL SUPPLIES) IMPLANT
GLOVE BIOGEL M 8.0 STRL (GLOVE) ×2 IMPLANT
GOWN STRL REUS W/TWL XL LVL3 (GOWN DISPOSABLE) ×2 IMPLANT
GUIDEWIRE STR DUAL SENSOR (WIRE) ×2 IMPLANT
MANIFOLD NEPTUNE II (INSTRUMENTS) ×2 IMPLANT
PACK CYSTO (CUSTOM PROCEDURE TRAY) ×2 IMPLANT
SHEATH ACCESS URETERAL 38CM (SHEATH) ×2 IMPLANT
STENT URET 6FRX24 CONTOUR (STENTS) ×2 IMPLANT
TUBING CONNECTING 10 (TUBING) ×2 IMPLANT
WIRE COONS/BENSON .038X145CM (WIRE) IMPLANT

## 2014-02-07 NOTE — Anesthesia Postprocedure Evaluation (Signed)
  Anesthesia Post-op Note  Patient: Daniel Blevins  Procedure(s) Performed: Procedure(s) (LRB): CYSTOSCOPY WITH BILATERAL RETROGRADE PYELOGRAM, BILATERAL URETEROSCOPY, RIGHT EXTRACTION OF STONES AND RIGHT STENT PLACEMENT (Bilateral)  Patient Location: PACU  Anesthesia Type: General  Level of Consciousness: awake and alert   Airway and Oxygen Therapy: Patient Spontanous Breathing  Post-op Pain: mild  Post-op Assessment: Post-op Vital signs reviewed, Patient's Cardiovascular Status Stable, Respiratory Function Stable, Patent Airway and No signs of Nausea or vomiting  Last Vitals:  Filed Vitals:   02/07/14 1104  BP: 115/73  Pulse:   Temp: 36.7 C  Resp: 12    Post-op Vital Signs: stable   Complications: No apparent anesthesia complications

## 2014-02-07 NOTE — Interval H&P Note (Signed)
History and Physical Interval Note:  02/07/2014 8:17 AM  Daniel Blevins  has presented today for surgery, with the diagnosis of BILATERAL URETERAL STONES  The various methods of treatment have been discussed with the patient and family. After consideration of risks, benefits and other options for treatment, the patient has consented to  Procedure(s): CYSTOSCOPY WITH RETROGRADE PYELOGRAM, URETEROSCOPY, EXTRACTION OF STONES AND STENT PLACEMENT (Bilateral) HOLMIUM LASER APPLICATION (Bilateral) as a surgical intervention .  The patient's history has been reviewed, patient examined, no change in status, stable for surgery.  I have reviewed the patient's chart and labs.  Questions were answered to the patient's satisfaction.     Jorja Loa

## 2014-02-07 NOTE — Transfer of Care (Signed)
Immediate Anesthesia Transfer of Care Note  Patient: Daniel Blevins  Procedure(s) Performed: Procedure(s): CYSTOSCOPY WITH BILATERAL RETROGRADE PYELOGRAM, BILATERAL URETEROSCOPY, RIGHT EXTRACTION OF STONES AND RIGHT STENT PLACEMENT (Bilateral)  Patient Location: PACU  Anesthesia Type:General  Level of Consciousness: awake, alert  and oriented  Airway & Oxygen Therapy: Patient Spontanous Breathing and Patient connected to face mask oxygen  Post-op Assessment: Report given to PACU RN and Post -op Vital signs reviewed and stable  Post vital signs: Reviewed and stable  Complications: No apparent anesthesia complications

## 2014-02-07 NOTE — Discharge Instructions (Signed)
POSTOPERATIVE CARE AFTER URETEROSCOPY  Stent management  *Stents are often left in after ureteroscopy and stone treatment. If left in, they often cause urinary frequency, urgency, occasional blood in the urine, as well as flank discomfort with urination. These are all expected issues, and should resolve after the stent is removed. *Often times, a small thread is left on the end of the stent, and brought out through the urethra. If so, this is used to remove the stent, making it unnecessary to look in the bladder with a scope in the office to remove the stent. If a thread is left on, did not pull on it until instructed. It is okay to remove the stent on Monday morning.  Diet  Once you have adequately recovered from anesthesia, you may gradually advance your diet, as tolerated, to your regular diet.  Activities  You may gradually increase your activities to your normal unrestricted level the day following your procedure.  Medications  You should resume all preoperative medications. If you are on aspirin-like compounds, you should not resume these until the blood clears from your urine. If given an antibiotic by the surgeon, take these until they are completed. You may also be given, if you have a stent, medications to decrease the urinary frequency and urgency.  Pain  After ureteroscopy, there may be some pain on the side of the scope. Take your pain medicine for this. Usually, this pain resolves within a day or 2.  Fever  Please report any fever over 100 to the doctor.      General Anesthesia, Care After Refer to this sheet in the next few weeks. These instructions provide you with information on caring for yourself after your procedure. Your health care provider may also give you more specific instructions. Your treatment has been planned according to current medical practices, but problems sometimes occur. Call your health care provider if you have any problems or questions after  your procedure. WHAT TO EXPECT AFTER THE PROCEDURE After the procedure, it is typical to experience:  Sleepiness.  Nausea and vomiting. HOME CARE INSTRUCTIONS  For the first 24 hours after general anesthesia:  Have a responsible person with you.  Do not drive a car. If you are alone, do not take public transportation.  Do not drink alcohol.  Do not take medicine that has not been prescribed by your health care provider.  Do not sign important papers or make important decisions.  You may resume a normal diet and activities as directed by your health care provider.  Change bandages (dressings) as directed.  If you have questions or problems that seem related to general anesthesia, call the hospital and ask for the anesthetist or anesthesiologist on call. SEEK MEDICAL CARE IF:  You have nausea and vomiting that continue the day after anesthesia.  You develop a rash. SEEK IMMEDIATE MEDICAL CARE IF:   You have difficulty breathing.  You have chest pain.  You have any allergic problems. Document Released: 07/26/2000 Document Revised: 04/24/2013 Document Reviewed: 11/02/2012 [facility] Patient Information 2015 [place], [place]. This information is not intended to replace advice given to you by your health care provider. Make sure you discuss any questions you have with your health care provider.

## 2014-02-07 NOTE — Op Note (Signed)
PATIENT:  Daniel Blevins  PRE-OPERATIVE DIAGNOSIS: Bilateral ureteral calculi  POST-OPERATIVE DIAGNOSIS: Right distal ureteral calculi, passed left ureteral calculi  PROCEDURE: Cystoscopy, bilateral retrograde ureteropyelograms with interpretive fluoroscopy, bilateral ureteroscopy, right ureteroscopic stone extraction, right double-J stent placement-24 cm x 6 French contour stent with string  SURGEON:  Lillette Boxer. Idil Maslanka, M.D.  ANESTHESIA:  General  EBL:  Minimal  DRAINS: Previously mentioned right double-J stent  LOCAL MEDICATIONS USED:  None  SPECIMEN:  Stones, the patient  INDICATION: Daniel Blevins is a 53 year old male who presents at this time for ureteroscopic management of bilateral ureteral stones seen on recent CT scan from August, 2015. He had a left UVJ stone with surrounding erythema/inflammation, as well as a more proximal left ureteral stone. He also had a mid right ureteral stone with hydronephrosis.  Description of procedure: The patient was properly identified and marked (if applicable) in the holding area. They were then  taken to the operating room and placed on the table in a supine position. General anesthesia was then administered. Once fully anesthetized the patient was moved to the dorsolithotomy position and the genitalia and perineum were sterilely prepped and draped in standard fashion. An official timeout was then performed.  A 22 French panendoscope was passed through the patient's urethra. There were no urethral lesions or strictures. Prostate was nonobstructive. The bladder was entered and inspected circumferentially. Both ureteral orifices were normal in configuration and location. There was no evidence of inflammatory reaction around the left ureteral orifice. There were no urothelial lesions. There were no trabeculations. No foreign bodies were present within the bladder.  Bilateral retrograde ureteropyelograms were performed using a 6 [place] open-ended  catheter and Omnipaque. On the right, there were 2 filling defects distally with slight ureteral narrowing just between the filling defects in the ureteral orifice. There was proximal mild hydroureteronephrosis. No further filling defects were seen in the more proximal ureter or within the renal pelvis. On the left, there was minimal narrowing of the left UVJ. I saw no evidence of filling defects along the course of the ureter in the pyelo-calyceal system. These all appeared normal. Seeing that the stone mentioned that was present in the left ureter was only 2 mm, I felt it necessary to perform eventual ureteroscopy on that side.  I passed a guidewire up into the right renal pelvis through the ureter using fluoroscopic guidance. I then dilated the right distal ureter first with the inner core and then the entire ureteral access sheath (12/14). After leaving the access sheath in for about a minute, it was removed over the guidewire. Rigid ureteroscopy was then performed. 2 stones were present in the distal ureter. I passed the scope all the way proximally to the hub of the scope. No further ureteral stones were seen. There were no ureteral abnormalities more proximally. The stones were grasped with the Nitinol basket and extracted without fragmentation. They were saved for specimen.  I then performed rigid ureteroscopy on the left side. No ureteral abnormalities or stones were noted, again with the scope being passed as far as I could.  At this point, the ureteroscope was removed. Using cystoscopic guidance, I then placed a 24 cm x 6 French contour double-J stent with the string on. Good proximal and distal curls were seen once the guidewire was removed. The bladder was drained and the procedure terminated. The string was taped to the patient's penis.    PLAN OF CARE: Discharge to home after PACU  PATIENT DISPOSITION:  PACU -  hemodynamically stable. He tolerated the procedure well.

## 2014-02-07 NOTE — Anesthesia Preprocedure Evaluation (Addendum)
Anesthesia Evaluation  Patient identified by MRN, date of birth, ID band Patient awake    Reviewed: Allergy & Precautions, H&P , NPO status , Patient's Chart, lab work & pertinent test results  Airway Mallampati: II TM Distance: >3 FB Neck ROM: Full    Dental no notable dental hx.    Pulmonary neg pulmonary ROS,  breath sounds clear to auscultation  Pulmonary exam normal       Cardiovascular Exercise Tolerance: Good hypertension, Pt. on medications and Pt. on home beta blockers + CAD and + Peripheral Vascular Disease Rhythm:Regular Rate:Normal  CABG 2001.  No current cardiopulmonary symptoms   Neuro/Psych negative neurological ROS  negative psych ROS   GI/Hepatic negative GI ROS, Neg liver ROS,   Endo/Other  negative endocrine ROS  Renal/GU negative Renal ROS  negative genitourinary   Musculoskeletal negative musculoskeletal ROS (+)   Abdominal (+) + obese,   Peds negative pediatric ROS (+)  Hematology negative hematology ROS (+)   Anesthesia Other Findings   Reproductive/Obstetrics negative OB ROS                          Anesthesia Physical Anesthesia Plan  ASA: III  Anesthesia Plan: General   Post-op Pain Management:    Induction: Intravenous  Airway Management Planned: LMA  Additional Equipment:   Intra-op Plan:   Post-operative Plan: Extubation in OR  Informed Consent: I have reviewed the patients History and Physical, chart, labs and discussed the procedure including the risks, benefits and alternatives for the proposed anesthesia with the patient or authorized representative who has indicated his/her understanding and acceptance.   Dental advisory given  Plan Discussed with: CRNA  Anesthesia Plan Comments:         Anesthesia Quick Evaluation

## 2014-02-08 ENCOUNTER — Encounter (HOSPITAL_COMMUNITY): Payer: Self-pay | Admitting: Urology

## 2014-03-14 ENCOUNTER — Encounter: Payer: Self-pay | Admitting: Interventional Cardiology

## 2014-03-14 ENCOUNTER — Ambulatory Visit (INDEPENDENT_AMBULATORY_CARE_PROVIDER_SITE_OTHER): Payer: Managed Care, Other (non HMO) | Admitting: Interventional Cardiology

## 2014-03-14 VITALS — BP 118/78 | HR 48 | Ht 71.0 in | Wt 245.0 lb

## 2014-03-14 DIAGNOSIS — I714 Abdominal aortic aneurysm, without rupture, unspecified: Secondary | ICD-10-CM

## 2014-03-14 DIAGNOSIS — I25810 Atherosclerosis of coronary artery bypass graft(s) without angina pectoris: Secondary | ICD-10-CM | POA: Insufficient documentation

## 2014-03-14 DIAGNOSIS — I10 Essential (primary) hypertension: Secondary | ICD-10-CM | POA: Insufficient documentation

## 2014-03-14 DIAGNOSIS — E785 Hyperlipidemia, unspecified: Secondary | ICD-10-CM | POA: Insufficient documentation

## 2014-03-14 DIAGNOSIS — N200 Calculus of kidney: Secondary | ICD-10-CM | POA: Insufficient documentation

## 2014-03-14 MED ORDER — LISINOPRIL 40 MG PO TABS: 40.0000 mg | ORAL_TABLET | ORAL | Status: DC

## 2014-03-14 MED ORDER — POTASSIUM CHLORIDE CRYS ER 20 MEQ PO TBCR: 40.0000 meq | EXTENDED_RELEASE_TABLET | ORAL | Status: DC

## 2014-03-14 MED ORDER — CARVEDILOL 25 MG PO TABS: 50.0000 mg | ORAL_TABLET | ORAL | Status: DC

## 2014-03-14 MED ORDER — SIMVASTATIN 20 MG PO TABS: 20.0000 mg | ORAL_TABLET | ORAL | Status: DC

## 2014-03-14 MED ORDER — AMLODIPINE BESYLATE 5 MG PO TABS: 5.0000 mg | ORAL_TABLET | ORAL | Status: DC

## 2014-03-14 NOTE — Progress Notes (Signed)
Patient ID: Daniel Blevins, male   DOB: 30-Dec-1960, 53 y.o.   MRN: [id number]   Date: 03/14/2014 ID: Daniel Blevins, DOB February 20, 1961, MRN [id number] PCP: Mathews Argyle, MD  Reason: Establish Cardiovascular Care  ASSESSMENT;  1. Coronary atherosclerotic heart disease with prior bypass surgery (number grafts unknown), asymptomatic 2. Abdominal aortic aneurysm with an office report that it is 6.1 cm in diameter by CT, attempting to obtain from urologist ([place]) 3. Hypertension, essential and controlled 4. Hyperlipidemia, on statin therapy, current status not known 5. Prior history of morbid obesity with greater than 100 pound weight loss after intestinal bypass surgery 6. History of ureteral stones  PLAN:  1. Abdominal ultrasound of aorta to confirm AAA size and have data in the colon system 2. Will need to have a myocardial perfusion study prior to AAA management is surgical 3. Refill all cardiovascular medications 4. Six-month follow-up 5. Obtain data from Dr. Diona Fanti concerning recent CT of the abdomen 6. Cautioned against isometric activity  SUBJECTIVE: Daniel Blevins is a 53 y.o. male who is who is here to establish longitudinal cardiovascular care. He has a history of significant cardiovascular disease with prior coronary bypass grafting at [facility] in 2001. He cannot recall the number of conduit stent were placed. He has been asymptomatic since bypass surgery. In 2010 he underwent laparoscopic gastric bypass at [facility]. He is known to have an abdominal aortic aneurysm and there is a statement and a recent urology note that contemporary CT scan demonstrated a 6.1 cm AAA. We are trying to confirm and obtain copies of this report.  There is a prior history of diabetes mellitus. Diabetes diagnosis was taken away after weight loss.  He has no cardiovascular complaints. He denies abdominal discomfort. His been troubled by ureteral stones. He exercises on a regular basis without  cardiac symptoms.   No Known Allergies  Current Outpatient Prescriptions on File Prior to Visit  Medication Sig Dispense Refill  . acetaminophen (TYLENOL) 325 MG tablet Take 650 mg by mouth once as needed for mild pain or headache.    Marland Kitchen amLODipine (NORVASC) 5 MG tablet Take 5 mg by mouth every morning.     Marland Kitchen aspirin 81 MG tablet Take 81 mg by mouth every morning.     . carvedilol (COREG) 25 MG tablet Take 50 mg by mouth every morning.     . Ferrous Sulfate (IRON) 325 (65 FE) MG TABS Take 1 tablet by mouth every morning.    . magnesium oxide (MAG-OX) 400 MG tablet Take 250 mg by mouth every morning.     . Multiple Vitamin (MULTIVITAMIN WITH MINERALS) TABS tablet Take 1 tablet by mouth every morning.    . potassium chloride SA (K-DUR,KLOR-CON) 20 MEQ tablet Take 40 mEq by mouth every morning.     . simvastatin (ZOCOR) 20 MG tablet Take 20 mg by mouth every morning.     . vitamin B-12 (CYANOCOBALAMIN) 1000 MCG tablet Take 1,000 mcg by mouth daily.    Marland Kitchen lisinopril (PRINIVIL,ZESTRIL) 40 MG tablet Take 40 mg by mouth every morning.      No current facility-administered medications on file prior to visit.    Past Medical History  Diagnosis Date  . Hx of CABG   . Hypertension   . 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 Coronary artery disease   . History of kidney stones   . Deafness in right ear     Past Surgical History  Procedure Laterality Date  . Laparoscopic  gastric bypass  2010    [facility]  . Laparoscopy N/A 03/06/2013    Procedure: LAPAROSCOPY DIAGNOSTIC;  Surgeon: Adin Hector, MD;  Location: [facility];  Service: General;  Laterality: N/A;  . Laparoscopic lysis of adhesions N/A 03/06/2013    Procedure: LAPAROSCOPIC LYSIS OF ADHESIONS AND CLOSURE OF INTERNAL HERNIAS x2;  Surgeon: Adin Hector, MD;  Location: [facility];  Service: General;  Laterality: N/A;  . Coronary artery bypass graft  2001  . Cardiac catheterization    . Back surgery  2009    lower back  . Cystoscopy  with retrograde pyelogram, ureteroscopy and stent placement Bilateral 02/07/2014    Procedure: CYSTOSCOPY WITH BILATERAL RETROGRADE PYELOGRAM, BILATERAL URETEROSCOPY, RIGHT EXTRACTION OF STONES AND RIGHT STENT PLACEMENT;  Surgeon: Jorja Loa, MD;  Location: [facility];  Service: Urology;  Laterality: Bilateral;    History   Social History  . Marital Status: Married    Spouse Name: N/A    Number of Children: N/A  . Years of Education: N/A   Occupational History  . Not on file.   Social History Main Topics  . Smoking status: Never Smoker   . Smokeless tobacco: Never Used  . Alcohol Use: No  . Drug Use: No  . Sexual Activity: No   Other Topics Concern  . Not on file   Social History Narrative    Family History  Problem Relation Age of Onset  . Lung cancer Mother     ROS: denies orthopnea, PND, palpitations, syncope, edema, claudication, transient neurological symptoms, abdominal pain, nausea, vomiting, and blood and urine and stool. No medication side effects.. Other systems negative for complaints.  OBJECTIVE: BP 118/78 mmHg  Pulse 48  Ht 5\' 11"  (1.803 m)  Wt 245 lb (111.131 kg)  BMI 34.19 kg/m2,  General: No acute distress, mildly to moderately obese HEENT: normal no jaundice or pallor Neck: JVD flat. Carotids absent Chest: clear Cardiac: Murmur: none. Gallop: S4. Rhythm: normal. Other: normal Abdomen: Bruit: none. Pulsation: definitely present inferior to the umbilicus. Extremities: Edema: none. Pulses: 2+  Neuro: normal Psych: normal  ECG: sinus bradycardia with inferolateral Q waves. Left atrial abnormality.

## 2014-03-14 NOTE — Patient Instructions (Signed)
Your physician recommends that you continue on your current medications as directed. Please refer to the Current Medication list given to you today.  All of your cardiac medications have been refilled today  We will request a copy of the CT ordered by Dr.Dahlstedt   You have been referred to [facility] (please schedule first available ASAP)  Your physician has requested that you have an abdominal aorta duplex. During this test, an ultrasound is used to evaluate the aorta. Allow 30 minutes for this exam. Do not eat after midnight the day before and avoid carbonated beverages ( PLEASE Schedule ASAP)  Your physician wants you to follow-up in: 6 months with Dr.Smith You will receive a reminder letter in the mail two months in advance. If you don't receive a letter, please call our office to schedule the follow-up appointment.

## 2014-03-15 ENCOUNTER — Telehealth: Payer: Self-pay

## 2014-03-15 ENCOUNTER — Ambulatory Visit (HOSPITAL_COMMUNITY): Payer: Managed Care, Other (non HMO) | Attending: Cardiovascular Disease | Admitting: *Deleted

## 2014-03-15 DIAGNOSIS — I714 Abdominal aortic aneurysm, without rupture, unspecified: Secondary | ICD-10-CM

## 2014-03-15 DIAGNOSIS — I2510 Atherosclerotic heart disease of native coronary artery without angina pectoris: Secondary | ICD-10-CM | POA: Insufficient documentation

## 2014-03-15 DIAGNOSIS — I10 Essential (primary) hypertension: Secondary | ICD-10-CM | POA: Diagnosis not present

## 2014-03-15 DIAGNOSIS — Z951 Presence of aortocoronary bypass graft: Secondary | ICD-10-CM | POA: Diagnosis not present

## 2014-03-15 DIAGNOSIS — E785 Hyperlipidemia, unspecified: Secondary | ICD-10-CM | POA: Insufficient documentation

## 2014-03-15 NOTE — Progress Notes (Signed)
Abdominal Aorta Duplex Performed 

## 2014-03-15 NOTE — Telephone Encounter (Signed)
Pt aware of AAA duplex results and Dr.Smith instructions. Pt AAA size is confirmed and is 5.9cm. Pt adv it is very important that he keep upcoming appt with [facility] (Dr.Fields). Pt instructed to avoid isometric or strenuous activity, pt adv to take it is easy. Pt had no additional questions, pt verbalized understanding to every discussed.

## 2014-03-18 ENCOUNTER — Encounter: Payer: Self-pay | Admitting: Interventional Cardiology

## 2014-03-20 ENCOUNTER — Encounter: Payer: Self-pay | Admitting: Vascular Surgery

## 2014-03-20 ENCOUNTER — Encounter: Payer: Self-pay | Admitting: Interventional Cardiology

## 2014-03-20 ENCOUNTER — Telehealth: Payer: Self-pay | Admitting: *Deleted

## 2014-03-20 NOTE — Telephone Encounter (Signed)
Spoke with pt regarding my chart message he sent requesting copy of AAA ultrasound. Pt verified address listed in chart as correct.  Will mail copy to him.

## 2014-03-21 ENCOUNTER — Telehealth: Payer: Self-pay

## 2014-03-21 ENCOUNTER — Other Ambulatory Visit: Payer: Self-pay

## 2014-03-21 ENCOUNTER — Encounter: Payer: Self-pay | Admitting: Vascular Surgery

## 2014-03-21 ENCOUNTER — Other Ambulatory Visit: Payer: Self-pay | Admitting: Vascular Surgery

## 2014-03-21 ENCOUNTER — Ambulatory Visit (HOSPITAL_COMMUNITY): Admission: RE | Admit: 2014-03-21 | Payer: Managed Care, Other (non HMO) | Source: Ambulatory Visit

## 2014-03-21 ENCOUNTER — Ambulatory Visit (INDEPENDENT_AMBULATORY_CARE_PROVIDER_SITE_OTHER): Payer: Managed Care, Other (non HMO) | Admitting: Vascular Surgery

## 2014-03-21 ENCOUNTER — Ambulatory Visit (HOSPITAL_COMMUNITY)
Admission: RE | Admit: 2014-03-21 | Discharge: 2014-03-21 | Disposition: A | Discharge status: Home / Self Care | Payer: Managed Care, Other (non HMO) | Source: Ambulatory Visit | Attending: Vascular Surgery | Admitting: Vascular Surgery

## 2014-03-21 VITALS — BP 122/74 | HR 60 | Temp 98.2°F | Resp 18 | Ht 71.0 in | Wt 248.0 lb

## 2014-03-21 DIAGNOSIS — Z01810 Encounter for preprocedural cardiovascular examination: Secondary | ICD-10-CM

## 2014-03-21 DIAGNOSIS — R0989 Other specified symptoms and signs involving the circulatory and respiratory systems: Secondary | ICD-10-CM

## 2014-03-21 DIAGNOSIS — I714 Abdominal aortic aneurysm, without rupture, unspecified: Secondary | ICD-10-CM | POA: Insufficient documentation

## 2014-03-21 NOTE — Progress Notes (Signed)
[facility] HISTORY AND PHYSICAL   History of Present Illness:  Patient is a 53 y.o. year old male who presents for evaluation of abdominal aortic aneurysm. The aneurysm was discovered approximately one to 2 years ago when the patient lived in [place]. He was noted on recent ultrasound to have enlargement of the aneurysm to 5.9 cm. The aneurysm was previously 5 cm on a CT scan done for small bowel obstruction approximately 6 months ago. He denies any back or abdominal pain. He does have family history of aneurysm in his father. Other medical problems include coronary artery disease with prior coronary artery bypass grafting using bilateral saphenous vein, prior gastric bypass, hypertension, nephrolithiasis all of which are currently stable. He is currently being evaluated for nocturia by Dr. Diona Fanti.  Past Medical History  Diagnosis Date  . Hx of CABG   . Hypertension   . 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 Coronary artery disease   . History of kidney stones   . Deafness in right ear     Past Surgical History  Procedure Laterality Date  . Laparoscopic gastric bypass  2010    [facility]  . Laparoscopy N/A 03/06/2013    Procedure: LAPAROSCOPY DIAGNOSTIC;  Surgeon: Adin Hector, MD;  Location: [facility];  Service: General;  Laterality: N/A;  . Laparoscopic lysis of adhesions N/A 03/06/2013    Procedure: LAPAROSCOPIC LYSIS OF ADHESIONS AND CLOSURE OF INTERNAL HERNIAS x2;  Surgeon: Adin Hector, MD;  Location: [facility];  Service: General;  Laterality: N/A;  . Coronary artery bypass graft  2001  . Cardiac catheterization    . Back surgery  2009    lower back  . Cystoscopy with retrograde pyelogram, ureteroscopy and stent placement Bilateral 02/07/2014    Procedure: CYSTOSCOPY WITH BILATERAL RETROGRADE PYELOGRAM, BILATERAL URETEROSCOPY, RIGHT EXTRACTION OF STONES AND RIGHT STENT PLACEMENT;  Surgeon: Jorja Loa, MD;  Location: [facility];  Service:  Urology;  Laterality: Bilateral;    Social History History  Substance Use Topics  . Smoking status: Never Smoker   . Smokeless tobacco: Never Used  . Alcohol Use: No    Family History Family History  Problem Relation Age of Onset  . Lung cancer Mother   . Cancer Mother     Lung  . AAA (abdominal aortic aneurysm) Father     Allergies  No Known Allergies   Current Outpatient Prescriptions  Medication Sig Dispense Refill  . acetaminophen (TYLENOL) 325 MG tablet Take 650 mg by mouth once as needed for mild pain or headache.    Marland Kitchen amLODipine (NORVASC) 5 MG tablet Take 1 tablet (5 mg total) by mouth every morning. 90 tablet 3  . aspirin 81 MG tablet Take 81 mg by mouth every morning.     . carvedilol (COREG) 25 MG tablet Take 2 tablets (50 mg total) by mouth every morning. 180 tablet 3  . Ferrous Sulfate (IRON) 325 (65 FE) MG TABS Take 1 tablet by mouth every morning.    Marland Kitchen lisinopril (PRINIVIL,ZESTRIL) 40 MG tablet Take 1 tablet (40 mg total) by mouth every morning. 90 tablet 3  . magnesium oxide (MAG-OX) 400 MG tablet Take 250 mg by mouth every morning.     . Multiple Vitamin (MULTIVITAMIN WITH MINERALS) TABS tablet Take 1 tablet by mouth every morning.    . potassium chloride SA (K-DUR,KLOR-CON) 20 MEQ tablet Take 2 tablets (40 mEq total) by mouth every morning. 180 tablet 3  .  simvastatin (ZOCOR) 20 MG tablet Take 1 tablet (20 mg total) by mouth every morning. 90 tablet 3  . vitamin B-12 (CYANOCOBALAMIN) 1000 MCG tablet Take 1,000 mcg by mouth daily.     No current facility-administered medications for this visit.    ROS:   General:  No weight loss, Fever, chills  HEENT: No recent headaches, no nasal bleeding, no visual changes, no sore throat  Neurologic: No dizziness, blackouts, seizures. No recent symptoms of stroke or mini- stroke. No recent episodes of slurred speech, or temporary blindness.  Cardiac: No recent episodes of chest pain/pressure, no shortness of breath  at rest.  No shortness of breath with exertion.  Denies history of atrial fibrillation or irregular heartbeat  Vascular: No history of rest pain in feet.  No history of claudication.  No history of non-healing ulcer, No history of DVT   Pulmonary: No home oxygen, no productive cough, no hemoptysis,  No asthma or wheezing  Musculoskeletal:  [ ]  Arthritis, [ ]  Low back pain,  [ ]  Joint pain  Hematologic:No history of hypercoagulable state.  No history of easy bleeding.  No history of anemia  Gastrointestinal: No hematochezia or melena,  No gastroesophageal reflux, no trouble swallowing  Urinary: [ ]  chronic Kidney disease, [ ]  on HD - [ ]  MWF or [ ]  TTHS, [ ]  Burning with urination, [x ] Frequent night time urination, [ ]  Difficulty urinating;   Skin: No rashes  Psychological: No history of anxiety,  No history of depression   Physical Examination  Filed Vitals:   03/21/14 0835  BP: 122/74  Pulse: 60  Temp: 98.2 F (36.8 C)  TempSrc: Oral  Resp: 18  Height: 5\' 11"  (1.803 m)  Weight: 248 lb (112.492 kg)  SpO2: 99%    Body mass index is 34.6 kg/(m^2).  General:  Alert and oriented, no acute distress HEENT: Normal Neck: No bruit or JVD Pulmonary: Clear to auscultation bilaterally Cardiac: Regular Rate and Rhythm without murmur Abdomen: Soft, non-tender, non-distended, pulsatile epigastric mass just to the left of midline, well-healed midline scar no hernia Skin: No rash Extremity Pulses:  2+ radial, brachial, femoral, dorsalis pedis, absent posterior tibial pulses bilaterally, full expansile 3+ popliteal pulses bilaterally Musculoskeletal: No deformity or edema  Neurologic: Upper and lower extremity motor 5/5 and symmetric  DATA:  Ultrasound of the abdomen reviewed shows 5.9 cm abdominal aortic aneurysm, 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 Tamala Julian and we will obtain an appointment with him for this. #2 CT angiogram abdomen and pelvis to see if the patient is an aneurysm stent graft candidate and to further clarify his renal and iliac anatomy.  #3 as far as his popliteal arteries are concerned we will need to watch these every few years to make sure they do not become aneurysmal over time  [name], MD [facility] [place]: [phone] Pager: [phone]

## 2014-03-21 NOTE — Addendum Note (Signed)
Addended by: Mena Goes on: 03/21/2014 04:26 PM   Modules accepted: Orders

## 2014-03-25 ENCOUNTER — Telehealth: Payer: Self-pay

## 2014-03-25 DIAGNOSIS — Z01818 Encounter for other preprocedural examination: Secondary | ICD-10-CM

## 2014-03-25 NOTE — Telephone Encounter (Signed)
Pt aware cardiac clearance has been requested by [facility] Dr.Fields office. Dr.Smith request pt have a Lexiscan to be cleared for upcoming AAA Sx.pt adv that a scheduler form our office will call him to schedule.pt verbalized understanding.

## 2014-03-26 ENCOUNTER — Telehealth: Payer: Self-pay | Admitting: Interventional Cardiology

## 2014-03-26 NOTE — Telephone Encounter (Signed)
ROI faxed to [place]  to [facility] Records... Records rec Back From [facility] 11.23.15 gave to Operator Room/KM

## 2014-03-27 ENCOUNTER — Encounter (HOSPITAL_COMMUNITY): Payer: Managed Care, Other (non HMO)

## 2014-03-27 ENCOUNTER — Inpatient Hospital Stay (HOSPITAL_COMMUNITY): Admission: RE | Admit: 2014-03-27 | Payer: Managed Care, Other (non HMO) | Source: Ambulatory Visit

## 2014-04-01 ENCOUNTER — Ambulatory Visit
Admission: RE | Admit: 2014-04-01 | Discharge: 2014-04-01 | Disposition: A | Discharge status: Home / Self Care | Payer: Managed Care, Other (non HMO) | Source: Ambulatory Visit | Attending: Vascular Surgery | Admitting: Vascular Surgery

## 2014-04-01 DIAGNOSIS — I714 Abdominal aortic aneurysm, without rupture, unspecified: Secondary | ICD-10-CM

## 2014-04-01 DIAGNOSIS — Z01810 Encounter for preprocedural cardiovascular examination: Secondary | ICD-10-CM

## 2014-04-01 MED ORDER — IOHEXOL 350 MG/ML SOLN
75.0000 mL | Freq: Once | INTRAVENOUS | Status: AC | PRN
  Administered 2014-04-01: 75 mL via INTRAVENOUS

## 2014-04-02 ENCOUNTER — Telehealth: Payer: Self-pay | Admitting: Interventional Cardiology

## 2014-04-02 NOTE — Telephone Encounter (Signed)
refill 

## 2014-04-02 NOTE — Telephone Encounter (Signed)
Records Received Via Mail From [facility] [place] In basket In Chart Prep Room/ KM

## 2014-04-03 ENCOUNTER — Ambulatory Visit (HOSPITAL_COMMUNITY): Payer: Managed Care, Other (non HMO) | Attending: Cardiology | Admitting: Radiology

## 2014-04-03 ENCOUNTER — Encounter: Payer: Self-pay | Admitting: Vascular Surgery

## 2014-04-03 DIAGNOSIS — I2510 Atherosclerotic heart disease of native coronary artery without angina pectoris: Secondary | ICD-10-CM | POA: Insufficient documentation

## 2014-04-03 DIAGNOSIS — I25810 Atherosclerosis of coronary artery bypass graft(s) without angina pectoris: Secondary | ICD-10-CM

## 2014-04-03 DIAGNOSIS — I10 Essential (primary) hypertension: Secondary | ICD-10-CM | POA: Insufficient documentation

## 2014-04-03 DIAGNOSIS — Z01818 Encounter for other preprocedural examination: Secondary | ICD-10-CM | POA: Diagnosis not present

## 2014-04-03 DIAGNOSIS — Z951 Presence of aortocoronary bypass graft: Secondary | ICD-10-CM | POA: Insufficient documentation

## 2014-04-03 MED ORDER — TECHNETIUM TC 99M SESTAMIBI GENERIC - CARDIOLITE
11.0000 | Freq: Once | INTRAVENOUS | Status: AC | PRN
  Administered 2014-04-03: 11 via INTRAVENOUS

## 2014-04-03 MED ORDER — REGADENOSON 0.4 MG/5ML IV SOLN
0.4000 mg | Freq: Once | INTRAVENOUS | Status: AC
  Administered 2014-04-03: 0.4 mg via INTRAVENOUS

## 2014-04-03 MED ORDER — TECHNETIUM TC 99M SESTAMIBI GENERIC - CARDIOLITE
33.0000 | Freq: Once | INTRAVENOUS | Status: AC | PRN
Start: 2014-04-03 — End: 2014-04-03
  Administered 2014-04-03: 33 via INTRAVENOUS

## 2014-04-03 NOTE — Progress Notes (Signed)
[facility] [address] [place], [place] [address] [phone]    Cardiology Nuclear Med Study  Daniel Blevins is a 53 y.o. male     MRN : [id number]     DOB: 1960-10-07  Procedure Date: 04/03/2014  Nuclear Med Background Indication for Stress Test:  Evaluation for Ischemia and Surgical Clearance-AAA Repair, Dr. [name] History:  CAD, CABG, MPI ~6 yrs ago (normal) Cardiac Risk Factors: Hypertension  Symptoms:  None indicated   Nuclear Pre-Procedure Caffeine/Decaff Intake:  None NPO After: 7:00pm   Lungs:  clear O2 Sat: 98% on room air. IV 0.9% NS with Angio Cath:  22g  IV Site: R Hand  IV Started by:  Crissie Figures, RN  Chest Size (in):  44 Cup Size: n/a  Height: 5\' 11"  (1.803 m)  Weight:  243 lb (110.224 kg)  BMI:  Body mass index is 33.91 kg/(m^2). Tech Comments:  N/A    Nuclear Med Study 1 or 2 day study: 1 day  Stress Test Type:  Lexiscan  Reading MD: N/A  Order Authorizing Provider:  Daneen Schick, MD  Resting Radionuclide: Technetium 58m Sestamibi  Resting Radionuclide Dose: 11.0 mCi   Stress Radionuclide:  Technetium 48m Sestamibi  Stress Radionuclide Dose: 33.0 mCi           Stress Protocol Rest HR: 48 Stress HR: 68  Rest BP: 143/100 Stress BP: 139/95  Exercise Time (min): n/a METS: n/a   Predicted Max HR: 167 bpm % Max HR: 40.72 bpm Rate Pressure Product: 9452   Dose of Adenosine (mg):  n/a Dose of Lexiscan: 0.4 mg  Dose of Atropine (mg): n/a Dose of Dobutamine: n/a mcg/kg/min (at max HR)  Stress Test Technologist: Glade Lloyd, BS-ES  Nuclear Technologist:  Earl Many, CNMT     Rest Procedure:  Myocardial perfusion imaging was performed at rest 45 minutes following the intravenous administration of Technetium 93m Sestamibi. Rest ECG: Sinus bradycardia with atrial bigeminy  Stress Procedure:  The patient received IV Lexiscan 0.4 mg over 15-seconds.  Technetium 60m Sestamibi injected at 30-seconds.  Quantitative  spect images were obtained after a 45 minute delay.  During the infusion of Lexiscan the patient complained of chest pressure that resolved in recovery.  Stress ECG: No significant change from baseline ECG  QPS Raw Data Images:  Normal; no motion artifact; normal heart/lung ratio. Stress Images:  Normal homogeneous uptake in all areas of the myocardium. Rest Images:  Normal homogeneous uptake in all areas of the myocardium. Subtraction (SDS):  No evidence of ischemia. Transient Ischemic Dilatation (Normal <1.22):  0.97 Lung/Heart Ratio (Normal <0.45):  0.29  Quantitative Gated Spect Images QGS EDV:  205 ml QGS ESV:  126 ml  Impression Exercise Capacity:  Lexiscan with no exercise. BP Response:  Normal blood pressure response. Clinical Symptoms:  Chest pressure ECG Impression:  No significant ST segment change suggestive of ischemia. Comparison with Prior Nuclear Study: No images to compare  Overall Impression:  The study is abnormal. This is an intermediate risk scan because of left ventricular dysfunction. There is no scar or ischemia. However there is dilatation of the ventricle and there is left ventricular dysfunction.  LV Ejection Fraction: 38%.  LV Wall Motion:   There is mild global hypokinesis. In addition the inferior wall and septum move less well than the other walls.  Dola Argyle, MD

## 2014-04-03 NOTE — Progress Notes (Signed)
Patient ID: Daniel Blevins, male   DOB: [birth year], 53 y.o.   MRN: [id number]  Medical records were received and given to Penni Homans per Dr. Darliss Ridgel request.

## 2014-04-04 ENCOUNTER — Ambulatory Visit (INDEPENDENT_AMBULATORY_CARE_PROVIDER_SITE_OTHER): Payer: Managed Care, Other (non HMO) | Admitting: Vascular Surgery

## 2014-04-04 ENCOUNTER — Other Ambulatory Visit: Payer: Self-pay

## 2014-04-04 ENCOUNTER — Encounter: Payer: Self-pay | Admitting: Vascular Surgery

## 2014-04-04 VITALS — BP 153/91 | HR 58 | Ht 71.0 in | Wt 250.4 lb

## 2014-04-04 DIAGNOSIS — I714 Abdominal aortic aneurysm, without rupture, unspecified: Secondary | ICD-10-CM

## 2014-04-04 NOTE — Progress Notes (Signed)
Patient is a 53-year-old male who returns for follow-up today. He was recently seen for abdominal aortic aneurysm. He returns today after recent CT angiogram. He denies any abdominal or back pain.  Physical exam:  Filed Vitals:   04/04/14 0839  BP: 153/91  Pulse: 58  Height: 5' 11" (1.803 m)  Weight: 250 lb 6.4 oz (113.581 kg)  SpO2: 98%    Abdomen: Soft nontender  Data: CT angiogram of the abdomen and pelvis is reviewed. There is a 6 cm infrarenal abdominal aortic aneurysm with approximately 2.5 cm of infrarenal neck. 3 cm right common iliac aneurysm 2.2 cm left common iliac aneurysm 2.1 cm left internal iliac aneurysm 1.7 cm right internal iliac artery aneurysm he has ectatic gluteal vessels bilaterally. There is no frank aneurysmal dilation of the gluteal vessels. 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  Measurements were taken today and the patient is a candidate for stent graft repair.  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 Had lengthy discussion with the patient today about risks benefits possible complications and different treatment options for his aneurysm. I discussed with the patient today open aneurysm repair with the advantages of definitive therapy but the disadvantages of longer recovery time possible sexual dysfunction and risk of transfusion. I also discussed with the patient endovascular stent graft repair with the advantages of smaller operation shorter recovery time less chance of sexual dysfunction but with the long-term follow-up more intensive. He has opted for aneurysm stent graft repair at this point. I will plan on doing a coil embolization of the right internal iliac artery and extending the stent down into the external iliac artery on the right to treat the right common iliac right internal iliac and infrarenal abdominal aortic  aneurysm. We will do a belt bottom repair on the left side so the left common and internal iliac artery aneurysms will especially need follow-up. These may require repair at some point in the future but to embolize both internal iliacs in the same setting would put him at higher risk of buttock claudication and colon ischemia. I also discussed with the patient that embolization of the right and one iliac artery had some risk of sexual dysfunction buttock claudication and colon ischemia but that overall this risk was fairly low. Other risks benefits and possible, the patient works [place] the patient today including but not limited to bleeding infection cardiac events vessel injury 25% chance of secondary interventions for his stent graft over the course of his lifetime and the follow-up as mentioned above.  Of note the patient's recent stress test did not show any significant areas of ischemia but did show a 35% ejection fraction. We will await final recommendations from Dr. Smith regarding this. Tentatively plan will be to proceed with aneurysm repair.  Plan: Gore Excluder stent graft repair of his aneurysm on Wednesday, 05/08/2014 pending no further cardiology workup.  Baleigh Rennaker, MD [facility] [place] [place] Office: [phone] Pager: [phone]  

## 2014-04-05 ENCOUNTER — Telehealth: Payer: Self-pay

## 2014-04-05 NOTE — Telephone Encounter (Signed)
-----   Message from [place], MD sent at 04/04/2014  8:20 PM EST ----- The myocardial perfusion study shows reduction in left ventricular function/strength with an ejection fraction of 38%. No evidence of ischemia/decreased blood flow to the heart. This is not significantly different than the more accurate LVEF by echo last year which was 40-45%. He is cleared to proceed with abdominal aortic aneurysm therapy and will be at moderate risk for cardiovascular complications. We need to make sure that Dr. Juanda Crumble feels is aware of this recommendation.

## 2014-04-05 NOTE — Telephone Encounter (Signed)
Pt aware of myoview results and Dr.Smith's recommendations with verbal understanding. The myocardial perfusion study shows reduction in left ventricular function/strength with an ejection fraction of 38%. No evidence of ischemia/decreased blood flow to the heart. This is not significantly different than the more accurate LVEF by echo last year which was 40-45%. He is cleared to proceed with abdominal aortic aneurysm therapy and will be at moderate risk for cardiovascular complications. We need to make sure that Dr. [name] is aware of this recommendation. Update routed to Dr.Fields.

## 2014-04-29 ENCOUNTER — Encounter (HOSPITAL_COMMUNITY)
Admission: RE | Admit: 2014-04-29 | Discharge: 2014-04-29 | Disposition: A | Discharge status: Home / Self Care | Payer: Managed Care, Other (non HMO) | Source: Ambulatory Visit | Attending: Vascular Surgery | Admitting: Vascular Surgery

## 2014-04-29 ENCOUNTER — Encounter (HOSPITAL_COMMUNITY): Payer: Self-pay

## 2014-04-29 LAB — ABO/RH: ABO/RH(D): O POS

## 2014-04-29 LAB — PROTIME-INR
INR: 1.13 (ref 0.00–1.49)
Prothrombin Time: 14.7 seconds (ref 11.6–15.2)

## 2014-04-29 LAB — URINALYSIS, ROUTINE W REFLEX MICROSCOPIC
BILIRUBIN URINE: NEGATIVE
Glucose, UA: NEGATIVE mg/dL
HGB URINE DIPSTICK: NEGATIVE
Ketones, ur: 15 mg/dL — AB
LEUKOCYTES UA: NEGATIVE
Nitrite: NEGATIVE
PROTEIN: NEGATIVE mg/dL
Specific Gravity, Urine: 1.026 (ref 1.005–1.030)
Urobilinogen, UA: 1 mg/dL (ref 0.0–1.0)
pH: 6 (ref 5.0–8.0)

## 2014-04-29 LAB — TYPE AND SCREEN
ABO/RH(D): O POS
Antibody Screen: NEGATIVE

## 2014-04-29 LAB — COMPREHENSIVE METABOLIC PANEL
ALT: 24 U/L (ref 0–53)
AST: 23 U/L (ref 0–37)
Albumin: 3.7 g/dL (ref 3.5–5.2)
Alkaline Phosphatase: 58 U/L (ref 39–117)
Anion gap: 9 (ref 5–15)
BUN: 16 mg/dL (ref 6–23)
CALCIUM: 8.6 mg/dL (ref 8.4–10.5)
CO2: 26 mmol/L (ref 19–32)
CREATININE: 1.42 mg/dL — AB (ref 0.50–1.35)
Chloride: 107 mEq/L (ref 96–112)
GFR calc Af Amer: 64 mL/min — ABNORMAL LOW (ref >90)
GFR, EST NON AFRICAN AMERICAN: 55 mL/min — AB (ref >90)
GLUCOSE: 76 mg/dL (ref 70–99)
Potassium: 3.5 mmol/L (ref 3.5–5.1)
Sodium: 142 mmol/L (ref 135–145)
TOTAL PROTEIN: 7.3 g/dL (ref 6.0–8.3)
Total Bilirubin: 0.5 mg/dL (ref 0.3–1.2)

## 2014-04-29 LAB — CBC
HCT: 36.5 % — ABNORMAL LOW (ref 39.0–52.0)
HEMOGLOBIN: 12.2 g/dL — AB (ref 13.0–17.0)
MCH: 25.5 pg — AB (ref 26.0–34.0)
MCHC: 33.4 g/dL (ref 30.0–36.0)
MCV: 76.2 fL — ABNORMAL LOW (ref 78.0–100.0)
PLATELETS: 118 10*3/uL — AB (ref 150–400)
RBC: 4.79 MIL/uL (ref 4.22–5.81)
RDW: 14.7 % (ref 11.5–15.5)
WBC: 4 10*3/uL (ref 4.0–10.5)

## 2014-04-29 LAB — SURGICAL PCR SCREEN
MRSA, PCR: POSITIVE — AB
Staphylococcus aureus: POSITIVE — AB

## 2014-04-29 LAB — BLOOD GAS, ARTERIAL
ACID-BASE EXCESS: 2.8 mmol/L — AB (ref 0.0–2.0)
Bicarbonate: 27 mEq/L — ABNORMAL HIGH (ref 20.0–24.0)
Drawn by: 206361
FIO2: 0.21 %
O2 Saturation: 95.7 %
PH ART: 7.415 (ref 7.350–7.450)
PO2 ART: 80.5 mmHg (ref 80.0–100.0)
Patient temperature: 98.6
TCO2: 28.3 mmol/L (ref 0–100)
pCO2 arterial: 43 mmHg (ref 35.0–45.0)

## 2014-04-29 LAB — URINE MICROSCOPIC-ADD ON

## 2014-04-29 LAB — APTT: aPTT: 30 seconds (ref 24–37)

## 2014-04-29 MED ORDER — CHLORHEXIDINE GLUCONATE CLOTH 2 % EX PADS: 6.0000 | MEDICATED_PAD | Freq: Once | CUTANEOUS | Status: DC

## 2014-04-29 MED ORDER — DEXTROSE 5 % IV SOLN
1.5000 g | INTRAVENOUS | Status: AC
  Administered 2014-04-30: 1.5 g via INTRAVENOUS
  Filled 2014-04-29: qty 1.5

## 2014-04-29 NOTE — Pre-Procedure Instructions (Signed)
Alani Lacivita  04/29/2014   Your procedure is scheduled on:  04/30/14  Report to [facility] Admitting at 9 AM.  Call this number if you have problems the morning of surgery: [phone]   Remember:   Do not eat food or drink liquids after midnight.   Take these medicines the morning of surgery with A SIP OF WATER: norvasc,carvedilol   Do not wear jewelry, make-up or nail polish.  Do not wear lotions, powders, or perfumes. You may wear deodorant.  Do not shave 48 hours prior to surgery. Men may shave face and neck.  Do not bring valuables to the hospital.  [facility] is not responsible                  for any belongings or valuables.               Contacts, dentures or bridgework may not be worn into surgery.  Leave suitcase in the car. After surgery it may be brought to your room.  For patients admitted to the hospital, discharge time is determined by your                treatment team.               Patients discharged the day of surgery will not be allowed to drive  home.  Name and phone number of your driver:   Special Instructions: Shower using CHG 2 nights before surgery and the night before surgery.  If you shower the day of surgery use CHG.  Use special wash - you have one bottle of CHG for all showers.  You should use approximately 1/3 of the bottle for each shower.   Please read over the following fact sheets that you were given: Pain Booklet, Coughing and Deep Breathing, Blood Transfusion Information, MRSA Information and Surgical Site Infection Prevention

## 2014-04-30 ENCOUNTER — Encounter (HOSPITAL_COMMUNITY)
Admission: RE | Disposition: A | Discharge status: Home / Self Care | Payer: Self-pay | Source: Ambulatory Visit | Attending: Vascular Surgery

## 2014-04-30 ENCOUNTER — Inpatient Hospital Stay (HOSPITAL_COMMUNITY): Payer: Managed Care, Other (non HMO) | Admitting: Certified Registered Nurse Anesthetist

## 2014-04-30 ENCOUNTER — Inpatient Hospital Stay (HOSPITAL_COMMUNITY): Payer: Managed Care, Other (non HMO)

## 2014-04-30 ENCOUNTER — Encounter (HOSPITAL_COMMUNITY): Payer: Self-pay | Admitting: Certified Registered Nurse Anesthetist

## 2014-04-30 ENCOUNTER — Other Ambulatory Visit: Payer: Self-pay | Admitting: *Deleted

## 2014-04-30 ENCOUNTER — Inpatient Hospital Stay (HOSPITAL_COMMUNITY)
Admission: RE | Admit: 2014-04-30 | Discharge: 2014-05-01 | DRG: 269 | Disposition: A | Discharge status: Home / Self Care | Payer: Managed Care, Other (non HMO) | Source: Ambulatory Visit | Attending: Vascular Surgery | Admitting: Vascular Surgery

## 2014-04-30 DIAGNOSIS — I10 Essential (primary) hypertension: Secondary | ICD-10-CM | POA: Diagnosis present

## 2014-04-30 DIAGNOSIS — H9191 Unspecified hearing loss, right ear: Secondary | ICD-10-CM | POA: Diagnosis present

## 2014-04-30 DIAGNOSIS — I714 Abdominal aortic aneurysm, without rupture, unspecified: Secondary | ICD-10-CM

## 2014-04-30 DIAGNOSIS — Z419 Encounter for procedure for purposes other than remedying health state, unspecified: Secondary | ICD-10-CM

## 2014-04-30 DIAGNOSIS — Z87442 Personal history of urinary calculi: Secondary | ICD-10-CM

## 2014-04-30 DIAGNOSIS — I723 Aneurysm of iliac artery: Secondary | ICD-10-CM

## 2014-04-30 DIAGNOSIS — Z9884 Bariatric surgery status: Secondary | ICD-10-CM

## 2014-04-30 DIAGNOSIS — I2510 Atherosclerotic heart disease of native coronary artery without angina pectoris: Secondary | ICD-10-CM | POA: Diagnosis present

## 2014-04-30 DIAGNOSIS — E876 Hypokalemia: Secondary | ICD-10-CM | POA: Diagnosis not present

## 2014-04-30 DIAGNOSIS — Z951 Presence of aortocoronary bypass graft: Secondary | ICD-10-CM

## 2014-04-30 DIAGNOSIS — Z95828 Presence of other vascular implants and grafts: Secondary | ICD-10-CM

## 2014-04-30 HISTORY — PX: ABDOMINAL AORTIC ENDOVASCULAR STENT GRAFT: SHX5707

## 2014-04-30 HISTORY — PX: PERIPHERAL VASCULAR CATHETERIZATION: SHX172C

## 2014-04-30 LAB — GLUCOSE, CAPILLARY: Glucose-Capillary: 247 mg/dL — ABNORMAL HIGH (ref 70–99)

## 2014-04-30 LAB — CBC
HCT: 31.4 % — ABNORMAL LOW (ref 39.0–52.0)
Hemoglobin: 10.2 g/dL — ABNORMAL LOW (ref 13.0–17.0)
MCH: 24.6 pg — AB (ref 26.0–34.0)
MCHC: 32.5 g/dL (ref 30.0–36.0)
MCV: 75.8 fL — AB (ref 78.0–100.0)
PLATELETS: 104 10*3/uL — AB (ref 150–400)
RBC: 4.14 MIL/uL — ABNORMAL LOW (ref 4.22–5.81)
RDW: 14.5 % (ref 11.5–15.5)
WBC: 4.2 10*3/uL (ref 4.0–10.5)

## 2014-04-30 LAB — MAGNESIUM: Magnesium: 1.8 mg/dL (ref 1.5–2.5)

## 2014-04-30 LAB — PROTIME-INR
INR: 1.28 (ref 0.00–1.49)
PROTHROMBIN TIME: 16.1 s — AB (ref 11.6–15.2)

## 2014-04-30 LAB — APTT: aPTT: 32 seconds (ref 24–37)

## 2014-04-30 SURGERY — INSERTION, ENDOVASCULAR STENT GRAFT, AORTA, ABDOMINAL
Anesthesia: General | Laterality: Right

## 2014-04-30 MED ORDER — MUPIROCIN 2 % EX OINT
1.0000 "application " | TOPICAL_OINTMENT | Freq: Once | CUTANEOUS | Status: AC
  Administered 2014-04-30: 1 via TOPICAL

## 2014-04-30 MED ORDER — SODIUM CHLORIDE 0.9 % IV SOLN: 500.0000 mL | Freq: Once | INTRAVENOUS | Status: AC | PRN

## 2014-04-30 MED ORDER — GUAIFENESIN-DM 100-10 MG/5ML PO SYRP: 15.0000 mL | ORAL_SOLUTION | ORAL | Status: DC | PRN

## 2014-04-30 MED ORDER — NEOSTIGMINE METHYLSULFATE 10 MG/10ML IV SOLN
INTRAVENOUS | Status: DC | PRN
  Administered 2014-04-30: 3 mg via INTRAVENOUS

## 2014-04-30 MED ORDER — GLYCOPYRROLATE 0.2 MG/ML IJ SOLN
INTRAMUSCULAR | Status: AC
  Filled 2014-04-30: qty 5

## 2014-04-30 MED ORDER — LIDOCAINE HCL (CARDIAC) 20 MG/ML IV SOLN
INTRAVENOUS | Status: DC | PRN
  Administered 2014-04-30: 80 mg via INTRAVENOUS

## 2014-04-30 MED ORDER — FENTANYL CITRATE 0.05 MG/ML IJ SOLN
INTRAMUSCULAR | Status: AC
  Filled 2014-04-30: qty 5

## 2014-04-30 MED ORDER — FENTANYL CITRATE 0.05 MG/ML IJ SOLN
INTRAMUSCULAR | Status: DC | PRN
  Administered 2014-04-30 (×3): 50 ug via INTRAVENOUS
  Administered 2014-04-30: 25 ug via INTRAVENOUS
  Administered 2014-04-30 (×3): 50 ug via INTRAVENOUS

## 2014-04-30 MED ORDER — PHENOL 1.4 % MT LIQD: 1.0000 | OROMUCOSAL | Status: DC | PRN

## 2014-04-30 MED ORDER — MORPHINE SULFATE 2 MG/ML IJ SOLN
INTRAMUSCULAR | Status: AC
  Administered 2014-04-30: 2 mg via INTRAVENOUS
  Filled 2014-04-30: qty 1

## 2014-04-30 MED ORDER — POTASSIUM CHLORIDE CRYS ER 20 MEQ PO TBCR: 20.0000 meq | EXTENDED_RELEASE_TABLET | Freq: Every day | ORAL | Status: DC | PRN

## 2014-04-30 MED ORDER — ACETAMINOPHEN 325 MG PO TABS: 325.0000 mg | ORAL_TABLET | ORAL | Status: DC | PRN

## 2014-04-30 MED ORDER — OXYCODONE HCL 5 MG PO TABS
5.0000 mg | ORAL_TABLET | ORAL | Status: DC | PRN
  Administered 2014-04-30: 10 mg via ORAL
  Administered 2014-04-30: 5 mg via ORAL
  Administered 2014-05-01: 10 mg via ORAL
  Filled 2014-04-30: qty 1
  Filled 2014-04-30: qty 2

## 2014-04-30 MED ORDER — LABETALOL HCL 5 MG/ML IV SOLN: 10.0000 mg | INTRAVENOUS | Status: DC | PRN

## 2014-04-30 MED ORDER — PANTOPRAZOLE SODIUM 40 MG PO TBEC
40.0000 mg | DELAYED_RELEASE_TABLET | Freq: Every day | ORAL | Status: DC
  Administered 2014-04-30 – 2014-05-01 (×2): 40 mg via ORAL
  Filled 2014-04-30 (×2): qty 1

## 2014-04-30 MED ORDER — SODIUM CHLORIDE 0.9 % IV SOLN: INTRAVENOUS | Status: DC

## 2014-04-30 MED ORDER — ONDANSETRON HCL 4 MG/2ML IJ SOLN
INTRAMUSCULAR | Status: AC
  Filled 2014-04-30: qty 4

## 2014-04-30 MED ORDER — ASPIRIN EC 81 MG PO TBEC
81.0000 mg | DELAYED_RELEASE_TABLET | Freq: Every morning | ORAL | Status: DC
  Administered 2014-05-01: 81 mg via ORAL
  Filled 2014-04-30: qty 1

## 2014-04-30 MED ORDER — POTASSIUM CHLORIDE CRYS ER 20 MEQ PO TBCR
40.0000 meq | EXTENDED_RELEASE_TABLET | Freq: Every morning | ORAL | Status: DC
  Administered 2014-05-01: 40 meq via ORAL
  Filled 2014-04-30: qty 2

## 2014-04-30 MED ORDER — ACETAMINOPHEN 325 MG PO TABS
ORAL_TABLET | ORAL | Status: AC
  Administered 2014-04-30: 650 mg via ORAL
  Filled 2014-04-30: qty 2

## 2014-04-30 MED ORDER — ONDANSETRON HCL 4 MG/2ML IJ SOLN
INTRAMUSCULAR | Status: AC
  Filled 2014-04-30: qty 2

## 2014-04-30 MED ORDER — PROTAMINE SULFATE 10 MG/ML IV SOLN
INTRAVENOUS | Status: AC
  Filled 2014-04-30: qty 5

## 2014-04-30 MED ORDER — MORPHINE SULFATE 2 MG/ML IJ SOLN
2.0000 mg | INTRAMUSCULAR | Status: DC | PRN
  Administered 2014-04-30 – 2014-05-01 (×2): 2 mg via INTRAVENOUS
  Filled 2014-04-30: qty 1

## 2014-04-30 MED ORDER — OXYCODONE HCL 5 MG/5ML PO SOLN: 5.0000 mg | Freq: Once | ORAL | Status: DC | PRN

## 2014-04-30 MED ORDER — HEPARIN SODIUM (PORCINE) 1000 UNIT/ML IJ SOLN
INTRAMUSCULAR | Status: AC
  Filled 2014-04-30: qty 2

## 2014-04-30 MED ORDER — DOCUSATE SODIUM 100 MG PO CAPS
100.0000 mg | ORAL_CAPSULE | Freq: Every day | ORAL | Status: DC
  Administered 2014-05-01: 100 mg via ORAL
  Filled 2014-04-30: qty 1

## 2014-04-30 MED ORDER — ACETAMINOPHEN 325 MG PO TABS
325.0000 mg | ORAL_TABLET | ORAL | Status: DC | PRN
  Administered 2014-04-30: 650 mg via ORAL

## 2014-04-30 MED ORDER — EPHEDRINE SULFATE 50 MG/ML IJ SOLN
INTRAMUSCULAR | Status: DC | PRN
  Administered 2014-04-30 (×4): 5 mg via INTRAVENOUS

## 2014-04-30 MED ORDER — GLYCOPYRROLATE 0.2 MG/ML IJ SOLN
INTRAMUSCULAR | Status: DC | PRN
Start: 2014-04-30 — End: 2014-04-30
  Administered 2014-04-30: .1 mg via INTRAVENOUS
  Administered 2014-04-30: .4 mg via INTRAVENOUS
  Administered 2014-04-30 (×2): .1 mg via INTRAVENOUS

## 2014-04-30 MED ORDER — ALUM & MAG HYDROXIDE-SIMETH 200-200-20 MG/5ML PO SUSP: 15.0000 mL | ORAL | Status: DC | PRN

## 2014-04-30 MED ORDER — LACTATED RINGERS IV SOLN
INTRAVENOUS | Status: DC
  Administered 2014-04-30 (×3): via INTRAVENOUS

## 2014-04-30 MED ORDER — MIDAZOLAM HCL 2 MG/2ML IJ SOLN
INTRAMUSCULAR | Status: AC
  Filled 2014-04-30: qty 2

## 2014-04-30 MED ORDER — FERROUS SULFATE 325 (65 FE) MG PO TABS
325.0000 mg | ORAL_TABLET | Freq: Every day | ORAL | Status: DC
  Administered 2014-05-01: 325 mg via ORAL
  Filled 2014-04-30 (×2): qty 1

## 2014-04-30 MED ORDER — AMLODIPINE BESYLATE 5 MG PO TABS
5.0000 mg | ORAL_TABLET | Freq: Every morning | ORAL | Status: DC
  Administered 2014-05-01: 5 mg via ORAL
  Filled 2014-04-30: qty 1

## 2014-04-30 MED ORDER — SODIUM CHLORIDE 0.9 % IV SOLN
INTRAVENOUS | Status: DC
  Administered 2014-04-30: 20:00:00 via INTRAVENOUS

## 2014-04-30 MED ORDER — PROTAMINE SULFATE 10 MG/ML IV SOLN
INTRAVENOUS | Status: DC | PRN
  Administered 2014-04-30: 30 mg via INTRAVENOUS
  Administered 2014-04-30: 5 mg via INTRAVENOUS
  Administered 2014-04-30: 35 mg via INTRAVENOUS
  Administered 2014-04-30: 30 mg via INTRAVENOUS

## 2014-04-30 MED ORDER — FENTANYL CITRATE 0.05 MG/ML IJ SOLN
INTRAMUSCULAR | Status: AC
  Filled 2014-04-30: qty 2

## 2014-04-30 MED ORDER — ACETAMINOPHEN 325 MG RE SUPP
325.0000 mg | RECTAL | Status: DC | PRN
  Filled 2014-04-30: qty 2

## 2014-04-30 MED ORDER — IRON 325 (65 FE) MG PO TABS: 1.0000 | ORAL_TABLET | ORAL | Status: DC

## 2014-04-30 MED ORDER — HYDRALAZINE HCL 20 MG/ML IJ SOLN: 5.0000 mg | INTRAMUSCULAR | Status: DC | PRN

## 2014-04-30 MED ORDER — HEPARIN SODIUM (PORCINE) 1000 UNIT/ML IJ SOLN
INTRAMUSCULAR | Status: DC | PRN
  Administered 2014-04-30: 12000 [IU] via INTRAVENOUS

## 2014-04-30 MED ORDER — PHENYLEPHRINE 40 MCG/ML (10ML) SYRINGE FOR IV PUSH (FOR BLOOD PRESSURE SUPPORT)
PREFILLED_SYRINGE | INTRAVENOUS | Status: AC
  Filled 2014-04-30: qty 20

## 2014-04-30 MED ORDER — OXYCODONE HCL 5 MG PO TABS: 5.0000 mg | ORAL_TABLET | Freq: Four times a day (QID) | ORAL | Status: DC | PRN

## 2014-04-30 MED ORDER — METOPROLOL TARTRATE 1 MG/ML IV SOLN: 2.0000 mg | INTRAVENOUS | Status: DC | PRN

## 2014-04-30 MED ORDER — ENOXAPARIN SODIUM 30 MG/0.3ML ~~LOC~~ SOLN
30.0000 mg | SUBCUTANEOUS | Status: DC
  Filled 2014-04-30: qty 0.3

## 2014-04-30 MED ORDER — SUCCINYLCHOLINE CHLORIDE 20 MG/ML IJ SOLN
INTRAMUSCULAR | Status: AC
  Filled 2014-04-30: qty 2

## 2014-04-30 MED ORDER — ONDANSETRON HCL 4 MG/2ML IJ SOLN: 4.0000 mg | Freq: Four times a day (QID) | INTRAMUSCULAR | Status: DC | PRN

## 2014-04-30 MED ORDER — LIDOCAINE HCL (CARDIAC) 20 MG/ML IV SOLN
INTRAVENOUS | Status: AC
  Filled 2014-04-30: qty 10

## 2014-04-30 MED ORDER — ROCURONIUM BROMIDE 100 MG/10ML IV SOLN
INTRAVENOUS | Status: DC | PRN
  Administered 2014-04-30: 10 mg via INTRAVENOUS
  Administered 2014-04-30: 40 mg via INTRAVENOUS

## 2014-04-30 MED ORDER — PROPOFOL 10 MG/ML IV BOLUS
INTRAVENOUS | Status: AC
  Filled 2014-04-30: qty 20

## 2014-04-30 MED ORDER — MUPIROCIN 2 % EX OINT
TOPICAL_OINTMENT | CUTANEOUS | Status: AC
  Filled 2014-04-30: qty 22

## 2014-04-30 MED ORDER — PROPOFOL 10 MG/ML IV BOLUS
INTRAVENOUS | Status: DC | PRN
  Administered 2014-04-30: 150 mg via INTRAVENOUS

## 2014-04-30 MED ORDER — VITAMIN B-12 1000 MCG PO TABS
1000.0000 ug | ORAL_TABLET | Freq: Every day | ORAL | Status: DC
  Administered 2014-04-30 – 2014-05-01 (×2): 1000 ug via ORAL
  Filled 2014-04-30 (×2): qty 1

## 2014-04-30 MED ORDER — FENTANYL CITRATE 0.05 MG/ML IJ SOLN
25.0000 ug | INTRAMUSCULAR | Status: DC | PRN
  Administered 2014-04-30 (×3): 50 ug via INTRAVENOUS

## 2014-04-30 MED ORDER — IODIXANOL 320 MG/ML IV SOLN
INTRAVENOUS | Status: DC | PRN
  Administered 2014-04-30: 20.6 mL
  Administered 2014-04-30: 150 mL via INTRAVENOUS

## 2014-04-30 MED ORDER — LISINOPRIL 40 MG PO TABS
40.0000 mg | ORAL_TABLET | Freq: Every morning | ORAL | Status: DC
  Administered 2014-05-01: 40 mg via ORAL
  Filled 2014-04-30: qty 1

## 2014-04-30 MED ORDER — ROCURONIUM BROMIDE 50 MG/5ML IV SOLN
INTRAVENOUS | Status: AC
  Filled 2014-04-30: qty 4

## 2014-04-30 MED ORDER — FENTANYL CITRATE 0.05 MG/ML IJ SOLN
INTRAMUSCULAR | Status: AC
  Administered 2014-04-30: 50 ug via INTRAVENOUS
  Filled 2014-04-30: qty 2

## 2014-04-30 MED ORDER — ARTIFICIAL TEARS OP OINT
TOPICAL_OINTMENT | OPHTHALMIC | Status: DC | PRN
  Administered 2014-04-30: 1 via OPHTHALMIC

## 2014-04-30 MED ORDER — MIDAZOLAM HCL 5 MG/5ML IJ SOLN
INTRAMUSCULAR | Status: DC | PRN
  Administered 2014-04-30: 2 mg via INTRAVENOUS

## 2014-04-30 MED ORDER — OXYCODONE HCL 5 MG PO TABS
ORAL_TABLET | ORAL | Status: AC
  Administered 2014-04-30: 10 mg via ORAL
  Filled 2014-04-30: qty 2

## 2014-04-30 MED ORDER — CARVEDILOL 25 MG PO TABS
50.0000 mg | ORAL_TABLET | Freq: Every morning | ORAL | Status: DC
  Filled 2014-04-30: qty 2

## 2014-04-30 MED ORDER — SIMVASTATIN 20 MG PO TABS
20.0000 mg | ORAL_TABLET | Freq: Every morning | ORAL | Status: DC
  Administered 2014-05-01: 20 mg via ORAL
  Filled 2014-04-30: qty 1

## 2014-04-30 MED ORDER — ACETAMINOPHEN 160 MG/5ML PO SOLN
325.0000 mg | ORAL | Status: DC | PRN
  Filled 2014-04-30: qty 20.3

## 2014-04-30 MED ORDER — 0.9 % SODIUM CHLORIDE (POUR BTL) OPTIME
TOPICAL | Status: DC | PRN
  Administered 2014-04-30: 1000 mL

## 2014-04-30 MED ORDER — DEXTROSE 5 % IV SOLN
1.5000 g | Freq: Two times a day (BID) | INTRAVENOUS | Status: AC
  Administered 2014-04-30 – 2014-05-01 (×2): 1.5 g via INTRAVENOUS
  Filled 2014-04-30 (×2): qty 1.5

## 2014-04-30 MED ORDER — LACTATED RINGERS IV SOLN
INTRAVENOUS | Status: DC | PRN
  Administered 2014-04-30: 11:00:00 via INTRAVENOUS

## 2014-04-30 MED ORDER — SODIUM CHLORIDE 0.9 % IR SOLN
Status: DC | PRN
  Administered 2014-04-30: 500 mL

## 2014-04-30 MED ORDER — VITAMIN D3 25 MCG (1000 UNIT) PO TABS
2000.0000 [IU] | ORAL_TABLET | Freq: Every day | ORAL | Status: DC
  Administered 2014-04-30 – 2014-05-01 (×2): 2000 [IU] via ORAL
  Filled 2014-04-30 (×2): qty 2

## 2014-04-30 MED ORDER — OXYCODONE HCL 5 MG PO TABS: 5.0000 mg | ORAL_TABLET | Freq: Once | ORAL | Status: DC | PRN

## 2014-04-30 SURGICAL SUPPLY — 80 items
BAG BANDED W/RUBBER/TAPE 36X54 (MISCELLANEOUS) ×3 IMPLANT
BAG SNAP BAND KOVER 36X36 (MISCELLANEOUS) ×3 IMPLANT
CANISTER SUCTION 2500CC (MISCELLANEOUS) ×3 IMPLANT
CATH BEACON 5 .035 65 VANSC3 (CATHETERS) ×3 IMPLANT
CATH BEACON 5.038 65CM KMP-01 (CATHETERS) ×3 IMPLANT
CATH OMNI FLUSH .035X70CM (CATHETERS) ×3 IMPLANT
CLIP TI MEDIUM 24 (CLIP) IMPLANT
CLIP TI WIDE RED SMALL 24 (CLIP) IMPLANT
COIL NESTER 14X12 (Vascular Products) ×39 IMPLANT
COVER DOME SNAP 22 D (MISCELLANEOUS) ×3 IMPLANT
COVER MAYO STAND STRL (DRAPES) ×3 IMPLANT
COVER PROBE W GEL 5X96 (DRAPES) ×3 IMPLANT
COVER SURGICAL LIGHT HANDLE (MISCELLANEOUS) ×3 IMPLANT
DEVICE CLOSURE PERCLS PRGLD 6F (VASCULAR PRODUCTS) ×8 IMPLANT
DRAPE TABLE COVER HEAVY DUTY (DRAPES) ×3 IMPLANT
DRSG TEGADERM 2-3/8X2-3/4 SM (GAUZE/BANDAGES/DRESSINGS) ×3 IMPLANT
DRYSEAL FLEXSHEATH 12FR 33CM (SHEATH) ×1
DRYSEAL FLEXSHEATH 18FR 33CM (SHEATH) ×1
ELECT CAUTERY BLADE 6.4 (BLADE) ×3 IMPLANT
ELECT REM PT RETURN 9FT ADLT (ELECTROSURGICAL) ×6
ELECTRODE REM PT RTRN 9FT ADLT (ELECTROSURGICAL) ×4 IMPLANT
EXCLUDER TNK LEG 31MX14X17 (Endovascular Graft) ×2 IMPLANT
EXCLUDER TRUNK LEG 31MX14X17 (Endovascular Graft) ×3 IMPLANT
GAUZE SPONGE 2X2 8PLY STRL LF (GAUZE/BANDAGES/DRESSINGS) ×2 IMPLANT
GLOVE BIO SURGEON STRL SZ 6.5 (GLOVE) ×12 IMPLANT
GLOVE BIO SURGEON STRL SZ7.5 (GLOVE) ×3 IMPLANT
GLOVE BIOGEL PI IND STRL 6.5 (GLOVE) ×2 IMPLANT
GLOVE BIOGEL PI INDICATOR 6.5 (GLOVE) ×1
GLOVE SURG SS PI 7.0 STRL IVOR (GLOVE) ×6 IMPLANT
GLOVE SURG SS PI 7.5 STRL IVOR (GLOVE) ×9 IMPLANT
GOWN STRL REUS W/ TWL LRG LVL3 (GOWN DISPOSABLE) ×6 IMPLANT
GOWN STRL REUS W/TWL LRG LVL3 (GOWN DISPOSABLE) ×3
GRAFT BALLN CATH 65CM (STENTS) ×2 IMPLANT
GRAFT EXCLUDER LEG (Endovascular Graft) ×3 IMPLANT
GUIDEWIRE ANGLED .035X150CM (WIRE) ×3 IMPLANT
GUIDEWIRE WHOLEY .035 145 JTIP (WIRE) ×3 IMPLANT
GUIDEWIRE WHOLEY HI TOR 145CM (WIRE) ×3 IMPLANT
INSERT FOGARTY 61MM (MISCELLANEOUS) IMPLANT
INSERT FOGARTY SM (MISCELLANEOUS) IMPLANT
KIT BASIN OR (CUSTOM PROCEDURE TRAY) ×3 IMPLANT
KIT ROOM TURNOVER OR (KITS) ×3 IMPLANT
LEG CONTRALETERAL16X16X11.5 (Endovascular Graft) ×1 IMPLANT
LIQUID BAND (GAUZE/BANDAGES/DRESSINGS) ×3 IMPLANT
NEEDLE PERC 18GX7CM (NEEDLE) ×3 IMPLANT
NS IRRIG 1000ML POUR BTL (IV SOLUTION) ×3 IMPLANT
PACK AORTA (CUSTOM PROCEDURE TRAY) ×3 IMPLANT
PAD ARMBOARD 7.5X6 YLW CONV (MISCELLANEOUS) ×6 IMPLANT
PENCIL BUTTON HOLSTER BLD 10FT (ELECTRODE) ×3 IMPLANT
PERCLOSE PROGLIDE 6F (VASCULAR PRODUCTS) ×12
PLUG VASCULAR AMPLATZER 10MM (Vascular Products) ×3 IMPLANT
PLUG VASCULAR AMPLATZER 12MM (Vascular Products) ×3 IMPLANT
PROBE PENCIL 8 MHZ STRL DISP (MISCELLANEOUS) ×3 IMPLANT
PROTECTION STATION PRESSURIZED (MISCELLANEOUS) ×3
SHEATH AVANTI 11CM 8FR (MISCELLANEOUS) ×3 IMPLANT
SHEATH BRITE TIP 8FR 23CM (MISCELLANEOUS) ×3 IMPLANT
SHEATH DRYSEAL FLEX 12FR 33CM (SHEATH) ×2 IMPLANT
SHEATH DRYSEAL FLEX 18FR 33CM (SHEATH) ×2 IMPLANT
SHEATH FLEXOR ANSEL 1 7F 45CM (SHEATH) ×3 IMPLANT
SPONGE GAUZE 2X2 STER 10/PKG (GAUZE/BANDAGES/DRESSINGS) ×1
SPONGE SURGIFOAM ABS GEL 100 (HEMOSTASIS) IMPLANT
STAPLER VISISTAT 35W (STAPLE) IMPLANT
STATION PROTECTION PRESSURIZED (MISCELLANEOUS) ×2 IMPLANT
STENT GRAFT BALLN CATH 65CM (STENTS) ×1
STENT GRAFT CONTRALAT 16X11.5 (Endovascular Graft) ×2 IMPLANT
STOPCOCK MORSE 400PSI 3WAY (MISCELLANEOUS) ×3 IMPLANT
SUT PROLENE 5 0 C 1 24 (SUTURE) IMPLANT
SUT VIC AB 2-0 CT1 27 (SUTURE)
SUT VIC AB 2-0 CT1 TAPERPNT 27 (SUTURE) IMPLANT
SUT VIC AB 3-0 SH 27 (SUTURE)
SUT VIC AB 3-0 SH 27X BRD (SUTURE) IMPLANT
SUT VICRYL 4-0 PS2 18IN ABS (SUTURE) ×6 IMPLANT
SYR 20CC LL (SYRINGE) ×6 IMPLANT
SYR 30ML LL (SYRINGE) IMPLANT
SYR 5ML LL (SYRINGE) ×3 IMPLANT
SYR MEDRAD MARK V 150ML (SYRINGE) ×3 IMPLANT
SYRINGE 10CC LL (SYRINGE) ×9 IMPLANT
TRAY FOLEY CATH 16FRSI W/METER (SET/KITS/TRAYS/PACK) ×3 IMPLANT
TUBING HIGH PRESSURE 120CM (CONNECTOR) ×3 IMPLANT
WIRE AMPLATZ SS-J .035X180CM (WIRE) ×6 IMPLANT
WIRE BENTSON .035X145CM (WIRE) ×6 IMPLANT

## 2014-04-30 NOTE — Interval H&P Note (Signed)
History and Physical Interval Note:  04/30/2014 9:58 AM  [name]  has presented today for surgery, with the diagnosis of Abdominal Aortic Aneurysm I71.4  The various methods of treatment have been discussed with the patient and family. After consideration of risks, benefits and other options for treatment, the patient has consented to  Procedure(s): ABDOMINAL AORTIC ENDOVASCULAR STENT GRAFT (N/A) as a surgical intervention .  The patient's history has been reviewed, patient examined, no change in status, stable for surgery.  I have reviewed the patient's chart and labs.  Questions were answered to the patient's satisfaction.     Oriana Horiuchi E

## 2014-04-30 NOTE — Anesthesia Preprocedure Evaluation (Signed)
Anesthesia Evaluation  Patient identified by MRN, date of birth, ID band Patient awake    Reviewed: Allergy & Precautions, H&P , NPO status , Patient's Chart, lab work & pertinent test results  History of Anesthesia Complications Negative for: history of anesthetic complications  Airway Mallampati: II  TM Distance: >3 FB Neck ROM: Full    Dental  (+) Edentulous Upper, Edentulous Lower   Pulmonary  breath sounds clear to auscultation        Cardiovascular hypertension, Pt. on medications and Pt. on home beta blockers + CAD, + CABG, + Peripheral Vascular Disease and +CHF - dysrhythmias Rhythm:Regular     Neuro/Psych    GI/Hepatic   Endo/Other    Renal/GU      Musculoskeletal   Abdominal   Peds  Hematology   Anesthesia Other Findings   Reproductive/Obstetrics                             Anesthesia Physical Anesthesia Plan  ASA: III  Anesthesia Plan: General   Post-op Pain Management:    Induction: Intravenous  Airway Management Planned: Oral ETT  Additional Equipment: Arterial line  Intra-op Plan:   Post-operative Plan: Extubation in OR  Informed Consent: I have reviewed the patients History and Physical, chart, labs and discussed the procedure including the risks, benefits and alternatives for the proposed anesthesia with the patient or authorized representative who has indicated his/her understanding and acceptance.     Plan Discussed with: CRNA and Surgeon  Anesthesia Plan Comments:         Anesthesia Quick Evaluation

## 2014-04-30 NOTE — Anesthesia Procedure Notes (Signed)
Procedure Name: Intubation Date/Time: 04/30/2014 11:07 AM Performed by: Garner Nash Pre-anesthesia Checklist: Patient identified, Emergency Drugs available, Suction available, Patient being monitored and Timeout performed Patient Re-evaluated:Patient Re-evaluated prior to inductionOxygen Delivery Method: Circle system utilized Preoxygenation: Pre-oxygenation with 100% oxygen Intubation Type: IV induction Ventilation: Mask ventilation without difficulty and Oral airway inserted - appropriate to patient size Laryngoscope Size: Mac and 4 Grade View: Grade II Tube size: 7.5 mm Number of attempts: 1 Airway Equipment and Method: Stylet Placement Confirmation: ETT inserted through vocal cords under direct vision,  positive ETCO2,  CO2 detector and breath sounds checked- equal and bilateral Secured at: 22 cm Tube secured with: Tape Dental Injury: Teeth and Oropharynx as per pre-operative assessment

## 2014-04-30 NOTE — H&P (View-Only) (Signed)
Patient is a 53 year old male who returns for follow-up today. He was recently seen for abdominal aortic aneurysm. He returns today after recent CT angiogram. He denies any abdominal or back pain.  Physical exam:  Filed Vitals:   04/04/14 0839  BP: 153/91  Pulse: 58  Height: 5\' 11"  (1.803 m)  Weight: 250 lb 6.4 oz (113.581 kg)  SpO2: 98%    Abdomen: Soft nontender  Data: CT angiogram of the abdomen and pelvis is reviewed. There is a 6 cm infrarenal abdominal aortic aneurysm with approximately 2.5 cm of infrarenal neck. 3 cm right common iliac aneurysm 2.2 cm left common iliac aneurysm 2.1 cm left internal iliac aneurysm 1.7 cm right internal iliac artery aneurysm he has ectatic gluteal vessels bilaterally. There is no frank aneurysmal dilation of the gluteal vessels. 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  Measurements were taken today and the patient is a candidate for stent graft repair.  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 Had lengthy discussion with the patient today about risks benefits possible complications and different treatment options for his aneurysm. I discussed with the patient today open aneurysm repair with the advantages of definitive therapy but the disadvantages of longer recovery time possible sexual dysfunction and risk of transfusion. I also discussed with the patient endovascular stent graft repair with the advantages of smaller operation shorter recovery time less chance of sexual dysfunction but with the long-term follow-up more intensive. He has opted for aneurysm stent graft repair at this point. I will plan on doing a coil embolization of the right internal iliac artery and extending the stent down into the external iliac artery on the right to treat the right common iliac right internal iliac and infrarenal abdominal aortic  aneurysm. We will do a belt bottom repair on the left side so the left common and internal iliac artery aneurysms will especially need follow-up. These may require repair at some point in the future but to embolize both internal iliacs in the same setting would put him at higher risk of buttock claudication and colon ischemia. I also discussed with the patient that embolization of the right and one iliac artery had some risk of sexual dysfunction buttock claudication and colon ischemia but that overall this risk was fairly low. Other risks benefits and possible, the patient works [place] the patient today including but not limited to bleeding infection cardiac events vessel injury 25% chance of secondary interventions for his stent graft over the course of his lifetime and the follow-up as mentioned above.  Of note the patient's recent stress test did not show any significant areas of ischemia but did show a 35% ejection fraction. We will await final recommendations from Dr. Tamala Julian regarding this. Tentatively plan will be to proceed with aneurysm repair.  Plan: Gore Excluder stent graft repair of his aneurysm on Wednesday, 05/08/2014 pending no further cardiology workup.  [name], MD [facility] [place] Office: [phone] Pager: [phone]

## 2014-04-30 NOTE — Progress Notes (Signed)
  Day of Surgery Note    Subjective:  No complaints  Filed Vitals:   04/30/14 1500  BP:   Pulse: 85  Temp:   Resp: 17    Incisions:   Bilateral groins are soft without hematoma Extremities:  3+ palpable DP pulses bilaterally Cardiac:  regular Lungs:  Non labored Abdomen:  Soft, NT/ND  Assessment/Plan:  This is a 53 y.o. male who is s/p Gore Excluder bifurcated stent graft for repair of bilateral common iliac aneurysm, right internal iliac and infrarenal aortic aneurysm, Embolization right internal iliac artery  -pt doing well this afternoon with 3+ palpable DP pulses bilaterally -should transfer to [place] soon -anticipate discharge home tomorrow   Leontine Locket, [place] 04/30/2014 3:06 PM

## 2014-04-30 NOTE — Transfer of Care (Addendum)
Immediate Anesthesia Transfer of Care Note  Patient: Daniel Blevins  Procedure(s) Performed: Procedure(s): ABDOMINAL AORTIC ENDOVASCULAR STENT GRAFT (N/A) EMBOLIZATION right internal iliac (Right)  Patient Location: PACU  Anesthesia Type:General  Level of Consciousness: awake, alert  and oriented  Airway & Oxygen Therapy: Patient Spontanous Breathing and Patient connected to nasal cannula oxygen  Post-op Assessment: Report given to PACU RN and Post -op Vital signs reviewed and stable  Post vital signs: Reviewed and stable  Complications: No apparent anesthesia complications   Urine output continues to increase after additional fluid administration.

## 2014-04-30 NOTE — Progress Notes (Signed)
Call to Dr. Ermalene Postin, reported cardiac history , alerting to perfusion imaging done recently under Dr. Linard Millers 's care.

## 2014-04-30 NOTE — Op Note (Signed)
Procedure: Gore Excluder bifurcated stent graft for repair of bilateral common iliac aneurysm, right internal iliac and infrarenal aortic aneurysm         Embolization right internal iliac artery  PreOp: Aortic and bilateral iliac aneurysms PostOp: same  Asst: Leontine Locket, PA-C  Anesthesia: General  Operative findings:   #1 Bilateral Proglide closure   #2 31 x14x17 cm main body Gore Excluder device delivered via a right femoral system   #3 16 x 11.5 cm left iliac limb contralateral common iliac    #4 14 x 10 ipsilateral iliac extension right external ilac    #5 Amplatzer plug right internal iliac x 2 and multiple Nestor coils     PROCEDURE DETAIL: After obtaining informed consent the patient was taken to the operating room. The patient was placed in supine position on the operating room table. After induction of general anesthesia and endotracheal intubation a Foley catheter was placed. Next the patient was prepped and draped in usual sterile fashion from the nipples down to the knees. Ultrasound was used to identify the right common femoral artery as well as the femoral bifurcation. An 11 blade was used to make a small neck in the skin over the level of the right common femoral artery. An introducer needle was then used to cannulate the right common femoral artery without difficulty. A 0.035 Bentson wire was then threaded up into the abdominal aorta through the right femoral system. A short 9 French dilator was placed over the guidewire the right femoral system. This was used to dilate the tract. The dilator was then removed and a Proglide device inserted over the guidewire into the right femoral system and this was deployed at the 2:00 position. The Proglide device was then removed and an additional Proglide device was brought in operative field and deployed at the 10:00 position. The sutures were kept separate and tagged with suture tags. Next the short 9 French sheath was then placed  back over the guidewire into the right femoral system and the dilator removed and the sheath thoroughly flushed with heparinized saline. Attention was then turned to the left groin. Again using ultrasound the left common femoral artery was identified. The femoral bifurcation was also identified. A small nick was made in the skin with the 11 blade. A hemostat was used to dilate a tract down to the artery. An introducer needle was then used to cannulate the left common femoral artery without difficulty. A 0.035 Bentson wire was then threaded up into the abdominal aorta under fluoroscopic guidance. A 6 French 40 cm sheath was then placed over the wire to dilate the tract. Two Proglide devices were again brought on operative field and these were fired sequentially in the 10:00 position followed by an additional Proglide at the 2:00 position. The Proglide delivery systems were removed and the long 6 French sheath placed back over the guidewire up to the level of the iliac of the aortic bifurcation.  A retrograde contrast angiogram was performed in a 30 degree LAO projection to identify the takeoff of the left internal iliac artery. At this point, a 5 [place] Omni flush catheter was placed over the guidewire and up and over the aortic bifurcation.  A Bentson wire was advanced into the contralateral external iliac.  The Omni flush was then removed and replaced with a 5 Fr Kumpe catheter to selectively catheterize the right internal iliac artery.  The glidewire was then exchanged for the Bentson wire and this was advanced deep  into the internal iliac.  The 6 Fr sheath in the left groin was then advanced into the internal iliac and several Nestor coils followed by 2 amplatzer plugs advanced into the right internal iliac and deployed. There was minimal flow in the internal iliac at this point and the coils/plugs were not protruding into the common iliac on contrast angiogram.   The sheath was then exchanged for a 12 French  dry seal sheath over an Amplatz wire.  The sheath in the right groin was exchanged over an Amplatz wire for an 18 Fr dry seal sheath.  The Omni flush catheter was then reintroduced via the left femoral system.  An abdominal aortogram was obtained in the AP position to determine level of the left and right renal arteries. At this point a 31 x 14 x 17 Gore Excluder main body device was selected. The main body device was then placed over the Amplatz wire in the right groin and advanced up to the level of the renal arteries. Magnified views of the renal arteries were performed to make sure that we were not covering these. The top portion of the stent graft was then deployed with the end of the stent just below the level of the left renal artery and an accessory right renal artery. The main body was delivered all the way down to the contralateral gate. Attention was then turned to the left groin. The Omni flush catheter was pulled down over a guidewire and removed and the Bentson wire placed in position to cannulate the contralateral gate. The Kumpe catheter was placed back over the guidewire in the left femoral system. A 5 [place] Kumpe catheter was placed over this and this was used to selectively catheterize the contralateral gate and an 035 angled glidewire was then advanced into the descending thoracic aorta. The main body portion of the gate was confirmed by twirling the omni flush catheter inside the stent graft. The catheter was then placed in a location so that we could use its markers to determine the exact length to the iliac bifurcation. An Amplatz wire was placed back through the pigtail catheter. A retrograde contrast angiogram was performed to determine the level of the left internal iliac artery and a 16 x 11.5 cm iliac limb was selected. The catheter was removed over the guidewire and the 16 x 11.5 cm limb advanced so there was full coverage of the long marker on the contralateral limb. This was then  deployed in the usual fashion down to the iliac bifurcation. The delivery system was then removed. The remainder of the ipsilateral iliac limb was also deployed.  A retrograde contrast angiogram was then performed to determine length of the right iliac extension to extend into the external iliac artery.  Measurement was made of the right iliac bifurcation and a 14 x10 cm ipsilateral limb was placed.  Attention was then turned back to the left iliac system and a Gore aortic balloon placed over the wire up to the level of the proximal end of the stent and this was ballooned to profile. The limb attachment site was also ballooned as well as the distal attachment site. Attention was then turned to the right groin and the balloon was advanced over the guidewire on the right side and the distal attachment site as well as the midportion of iliac limb were also ballooned. The 5 [place] Omni flush catheter was then placed back to the guidewire on the right side and a completion arteriogram was  obtained. This showed no evidence of proximal or distal type I endoleak. There was possibly a small type II endoleak in t the AP projection from a lumbar but this was not seen on an oblique view. There is no filling of the aneurysm sac.  There was no cross filling of the left to right internal filling the iliac aneurysm sac. The left internal iliac was patent although clearly aneurysmal and plan will be to repair this after collateralization from the right coil embolization.  At this point the Omni flush catheter was removed over a guidewire. All delivery devices were removed. We then proceeded to remove the large 18 French sheath from the right side with the guidewire in place. With pressure held above and below the insertion site the lateral Proglide closure was secured down. There was good hemostasis. The guidewire was removed from the right side. Attention was then turned to the left groin. In similar fashion the 16 French sheath  was removed and the guidewire left in place.With pressure held above and below the insertion site the lateral and medial Proglide closures were secured down. There was good hemostasis and the Amplatz wire was removed from the left groin. The patient had been given 12000 units of heparin before introducing the main body device. This was reversed with 100 mg of protamine at the end of the case. Each groin puncture site was then closed with a running 4-0 Vicryl subcuticular stitch.  The patient tolerated procedure well and there were no complications. Instrument sponge and needle counts correct in the case. Patient was awakened in the operating room extubated and taken to the recovery room in stable condition.  [name], MD [facility] [place] Office: [phone] Pager: [phone]

## 2014-05-01 ENCOUNTER — Encounter (HOSPITAL_COMMUNITY): Payer: Self-pay | Admitting: Vascular Surgery

## 2014-05-01 LAB — CBC
HEMATOCRIT: 31.7 % — AB (ref 39.0–52.0)
HEMOGLOBIN: 10.1 g/dL — AB (ref 13.0–17.0)
MCH: 24.3 pg — ABNORMAL LOW (ref 26.0–34.0)
MCHC: 31.9 g/dL (ref 30.0–36.0)
MCV: 76.2 fL — ABNORMAL LOW (ref 78.0–100.0)
Platelets: 94 10*3/uL — ABNORMAL LOW (ref 150–400)
RBC: 4.16 MIL/uL — ABNORMAL LOW (ref 4.22–5.81)
RDW: 14.6 % (ref 11.5–15.5)
WBC: 5.4 10*3/uL (ref 4.0–10.5)

## 2014-05-01 LAB — BASIC METABOLIC PANEL
Anion gap: 6 (ref 5–15)
BUN: 11 mg/dL (ref 6–23)
CHLORIDE: 105 meq/L (ref 96–112)
CO2: 29 mmol/L (ref 19–32)
Calcium: 7.8 mg/dL — ABNORMAL LOW (ref 8.4–10.5)
Creatinine, Ser: 1.09 mg/dL (ref 0.50–1.35)
GFR calc Af Amer: 88 mL/min — ABNORMAL LOW (ref >90)
GFR calc non Af Amer: 76 mL/min — ABNORMAL LOW (ref >90)
GLUCOSE: 132 mg/dL — AB (ref 70–99)
POTASSIUM: 3.1 mmol/L — AB (ref 3.5–5.1)
Sodium: 140 mmol/L (ref 135–145)

## 2014-05-01 MED ORDER — CHLORHEXIDINE GLUCONATE CLOTH 2 % EX PADS
6.0000 | MEDICATED_PAD | Freq: Every day | CUTANEOUS | Status: DC
  Administered 2014-05-01: 6 via TOPICAL

## 2014-05-01 MED ORDER — MUPIROCIN 2 % EX OINT
1.0000 "application " | TOPICAL_OINTMENT | Freq: Two times a day (BID) | CUTANEOUS | Status: DC
  Administered 2014-05-01: 1 via NASAL

## 2014-05-01 NOTE — Plan of Care (Signed)
Problem: Consults Goal: Diagnosis CEA/CES/AAA Stent Abdominal Aortic Aneurysm Stent (AAA)     

## 2014-05-01 NOTE — Progress Notes (Addendum)
  Progress Note    05/01/2014 7:45 AM 1 Day Post-Op  Subjective:  C/o a little pain in his groins that improved with pain medication  Tm 99.2 HR 50's-80's NSR/SB 203'T-597'C systolic 16% RA  Gtts:  None  Filed Vitals:   05/01/14 0325  BP: 134/75  Pulse: 62  Temp: 99.2 F (37.3 C)  Resp: 13    Physical Exam: Cardiac:  regular Lungs:  Non labored Incisions:  Bilateral groins are soft without hematoma Extremities:  3+ palpable DP bilaterally Abdomen:  Soft, NT/ND  CBC    Component Value Date/Time   WBC 5.4 05/01/2014 0500   RBC 4.16* 05/01/2014 0500   HGB 10.1* 05/01/2014 0500   HCT 31.7* 05/01/2014 0500   PLT 94* 05/01/2014 0500   MCV 76.2* 05/01/2014 0500   MCH 24.3* 05/01/2014 0500   MCHC 31.9 05/01/2014 0500   RDW 14.6 05/01/2014 0500   LYMPHSABS 0.5* 03/05/2013 1833   MONOABS 0.4 03/05/2013 1833   EOSABS 0.1 03/05/2013 1833   BASOSABS 0.0 03/05/2013 1833    BMET    Component Value Date/Time   NA 140 05/01/2014 0500   K 3.1* 05/01/2014 0500   CL 105 05/01/2014 0500   CO2 29 05/01/2014 0500   GLUCOSE 132* 05/01/2014 0500   BUN 11 05/01/2014 0500   CREATININE 1.09 05/01/2014 0500   CALCIUM 7.8* 05/01/2014 0500   GFRNONAA 76* 05/01/2014 0500   GFRAA 88* 05/01/2014 0500    INR    Component Value Date/Time   INR 1.28 04/30/2014 1420     Intake/Output Summary (Last 24 hours) at 05/01/14 0745 Last data filed at 05/01/14 0453  Gross per 24 hour  Intake   2890 ml  Output   2430 ml  Net    460 ml     Assessment:  53 y.o. 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 -BUN/Cr improved this am from pre-op -foley removed this morning-pt needs to void -pt needs to ambulate -home later this morning -DVT prophylaxis:  Lovenox to start later today if pt is not discharged. -f/u with Dr. Oneida Alar in 4 weeks with CTA   Leontine Locket, PA-C [facility] [phone] 05/01/2014 7:45 AM    Agree with above.  D/c home today if no urinary retention.  [name], MD [facility] [place] Office: [phone] Pager: [phone]

## 2014-05-01 NOTE — Progress Notes (Signed)
Discussed AVS discharge instructions and prescription with pt and spouse. Both verbalize understand and have no further questions. IV's and monitor removed. Notified CCMD and Elink of discharge. All personal belongings with pt.

## 2014-05-01 NOTE — Anesthesia Postprocedure Evaluation (Signed)
  Anesthesia Post-op Note  Patient: Daniel Blevins  Procedure(s) Performed: Procedure(s): ABDOMINAL AORTIC ENDOVASCULAR STENT GRAFT (N/A) EMBOLIZATION right internal iliac (Right)  Patient Location: PACU  Anesthesia Type:General  Level of Consciousness: awake  Airway and Oxygen Therapy: Patient Spontanous Breathing  Post-op Pain: mild  Post-op Assessment: Post-op Vital signs reviewed, Patient's Cardiovascular Status Stable, Respiratory Function Stable, Patent Airway, No signs of Nausea or vomiting and Pain level controlled  Post-op Vital Signs: Reviewed and stable  Last Vitals:  Filed Vitals:   05/01/14 0804  BP: 127/76  Pulse: 54  Temp: 37.1 C  Resp: 26    Complications: No apparent anesthesia complications

## 2014-05-06 NOTE — Discharge Summary (Signed)
[facility] Discharge Summary   Daniel Blevins 03/20/1961 54 y.o. male  MRN: [id number]  Admission Date: 04/30/2014  Discharge Date: 05/01/14  Physician: No att. providers found  Admission Diagnosis: Abdominal Aortic Aneurysm I71.4   HPI:   This is a 54 y.o. male who presents for evaluation of abdominal aortic aneurysm. The aneurysm was discovered approximately one to 2 years ago when the patient lived in [place]. He was noted on recent ultrasound to have enlargement of the aneurysm to 5.9 cm. The aneurysm was previously 5 cm on a CT scan done for small bowel obstruction approximately 6 months ago. He denies any back or abdominal pain. He does have family history of aneurysm in his father. Other medical problems include coronary artery disease with prior coronary artery bypass grafting using bilateral saphenous vein, prior gastric bypass, hypertension, nephrolithiasis all of which are currently stable. He is currently being evaluated for nocturia by Dr. Diona Fanti.  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   By POD 1, the pt was doing well.  He was able to void and was walking and eating well.  The remainder of the hospital course  consisted of increasing mobilization and increasing intake of solids without difficulty.  CBC    Component Value Date/Time   WBC 5.4 05/01/2014 0500   RBC 4.16* 05/01/2014 0500   HGB 10.1* 05/01/2014 0500   HCT 31.7* 05/01/2014 0500   PLT 94* 05/01/2014 0500   MCV 76.2* 05/01/2014 0500   MCH 24.3* 05/01/2014 0500   MCHC 31.9 05/01/2014 0500   RDW 14.6 05/01/2014 0500   LYMPHSABS 0.5* 03/05/2013 1833   MONOABS 0.4 03/05/2013 1833   EOSABS 0.1 03/05/2013 1833   BASOSABS 0.0 03/05/2013 1833    BMET    Component Value Date/Time   NA 140 05/01/2014 0500   K 3.1* 05/01/2014 0500   CL 105 05/01/2014 0500   CO2 29 05/01/2014 0500   GLUCOSE 132* 05/01/2014 0500   BUN 11 05/01/2014 0500   CREATININE 1.09 05/01/2014 0500   CALCIUM 7.8* 05/01/2014 0500   GFRNONAA 76* 05/01/2014 0500   GFRAA 88* 05/01/2014 0500     Discharge Instructions:   The patient is discharged with extensive instructions on wound care and progressive ambulation.  They are instructed not to drive or perform any heavy lifting until returning to see the physician in his office.  Discharge Instructions    ABDOMINAL PROCEDURE/ANEURYSM REPAIR/AORTO-BIFEMORAL BYPASS:  Call MD for increased abdominal pain; cramping diarrhea; nausea/vomiting    Complete by:  As directed      Call MD for:  redness, tenderness, or signs of infection (pain, swelling, bleeding, redness, odor or green/yellow discharge around incision site)    Complete by:  As directed      Call MD for:  severe or increased pain, loss or decreased  feeling  in affected limb(s)    Complete by:  As directed      Call MD for:  temperature >100.5    Complete by:  As directed      Discharge instructions    Complete by:  As directed   Take potassium 40 mEq this evening for one dose for low potassium this morning.     Discharge wound care:    Complete by:  As directed   Shower daily with soap and water starting 05/02/14     Driving Restrictions    Complete  by:  As directed   No driving for 2 weeks     Lifting restrictions    Complete by:  As directed   No lifting for 4 weeks     Resume previous diet    Complete by:  As directed            Discharge Diagnosis:  Abdominal Aortic Aneurysm I71.4  Secondary Diagnosis: Patient Active Problem List   Diagnosis Date Noted  . AAA (abdominal aortic aneurysm) without rupture 03/21/2014  . CAD (coronary artery disease) of artery bypass graft 03/14/2014  . Kidney stones 03/14/2014  . Hyperlipidemia 03/14/2014  . Essential hypertension 03/14/2014  . SBO (small bowel obstruction) 03/06/2013  . History of gastric bypass 03/06/2013  . AAA (abdominal aortic aneurysm) - 5cm infrarenal by CTscan [id number] 03/06/2013   Past Medical History  Diagnosis Date  . Hx of CABG   . Hypertension   . 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 Coronary artery disease   . History of kidney stones   . Deafness in right ear        Medication List    TAKE these medications        amLODipine 5 MG tablet  Commonly known as:  NORVASC  Take 1 tablet (5 mg total) by mouth every morning.     aspirin 81 MG tablet  Take 81 mg by mouth every morning.     carvedilol 25 MG tablet  Commonly known as:  COREG  Take 2 tablets (50 mg total) by mouth every morning.     cholecalciferol 1000 UNITS tablet  Commonly known as:  VITAMIN D  Take 2,000 Units by mouth daily.     Iron 325 (65 FE) MG Tabs  Take 1 tablet by mouth every morning.     lisinopril 40 MG tablet  Commonly known as:  PRINIVIL,ZESTRIL  Take 1 tablet (40 mg total) by mouth every morning.     magnesium oxide 400 MG tablet  Commonly known as:  MAG-OX  Take 250 mg by mouth every morning.     multivitamin with minerals Tabs tablet  Take 1 tablet by mouth every morning.     oxyCODONE 5 MG immediate release tablet  Commonly known as:  Oxy IR/ROXICODONE  Take 1 tablet (5 mg total) by mouth every 6 (six) hours as needed for moderate pain.      potassium chloride SA 20 MEQ tablet  Commonly known as:  K-DUR,KLOR-CON  Take 2 tablets (40 mEq total) by mouth every morning.     simvastatin 20 MG tablet  Commonly known as:  ZOCOR  Take 1 tablet (20 mg total) by mouth every morning.     vitamin B-12 1000 MCG tablet  Commonly known as:  CYANOCOBALAMIN  Take 1,000 mcg by mouth daily.        Roxicodone #30 No Refill  Disposition: home  Patient's condition: is Good  Follow up: 1. Dr. Oneida Alar in 4 weeks with CTA   Leontine Locket, PA-C [facility] [phone] 05/06/2014  2:14 PM   - For VQI Registry use --- Instructions: Press F2 to tab through selections.  Delete question if not applicable.   Post-op:  Time to Extubation: [x]  In OR, [ ]  < 12 hrs, [ ]  12-24 hrs, [ ]  >=24 hrs Vasopressors Req. Post-op: No MI: No., [ ]  Troponin only, [ ]  EKG or Clinical New Arrhythmia: No CHF: No ICU Stay: 1 day in stepdown Transfusion: No  If yes, n/a units given  Complications: Resp failure: No., [ ]  Pneumonia, [ ]  Ventilator Chg in renal function: No., [ ]  Inc. Cr > 0.5, [ ]  Temp. Dialysis, [ ]  Permanent dialysis Leg ischemia: No., no Surgery needed, [ ]  Yes, Surgery needed, [ ]  Amputation Bowel ischemia: No., [ ]  Medical Rx, [ ]  Surgical Rx Wound complication: No., [ ]  Superficial separation/infection, [ ]  Return to OR Return to OR: No  Return to OR for bleeding: No Stroke: No., [ ]  Minor, [ ]  Major  Discharge medications: Statin use:  Yes If No: [ ]  For Medical reasons, [ ]  Non-compliant, [ ]  Not-indicated ASA use:  Yes  If No: [ ]  For Medical reasons, [ ]  Non-compliant, [ ]  Not-indicated Plavix use:  No If No: [ ]  For Medical reasons, [ ]  Non-compliant, [ ]  Not-indicated Beta blocker use:  Yes If No: [ ]  For Medical reasons, [ ]  Non-compliant, [ ]  Not-indicated

## 2014-05-08 ENCOUNTER — Encounter: Payer: Self-pay | Admitting: Interventional Cardiology

## 2014-05-09 ENCOUNTER — Telehealth: Payer: Self-pay | Admitting: Vascular Surgery

## 2014-05-09 NOTE — Telephone Encounter (Addendum)
-----   Message from Mena Goes, RN sent at 04/30/2014  3:47 PM EST ----- Regarding: Schedule   ----- Message -----    From: Gabriel Earing, PA-C    Sent: 04/30/2014   3:09 PM      To: [facility] Charge Pool  S/p EVAR 04/30/14.  F/u with Dr. Oneida Alar in 4 weeks with CTA protocol.  Thanks, Samantha   05/09/14: lm for pt to call for appt details, sent to [facility] for prior auth, dpm

## 2014-05-29 ENCOUNTER — Encounter: Payer: Self-pay | Admitting: Vascular Surgery

## 2014-05-30 ENCOUNTER — Ambulatory Visit
Admission: RE | Admit: 2014-05-30 | Discharge: 2014-05-30 | Disposition: A | Discharge status: Home / Self Care | Payer: 59 | Source: Ambulatory Visit | Attending: Vascular Surgery | Admitting: Vascular Surgery

## 2014-05-30 ENCOUNTER — Ambulatory Visit (INDEPENDENT_AMBULATORY_CARE_PROVIDER_SITE_OTHER): Payer: Self-pay | Admitting: Vascular Surgery

## 2014-05-30 ENCOUNTER — Encounter: Payer: Self-pay | Admitting: Vascular Surgery

## 2014-05-30 VITALS — BP 120/76 | HR 72 | Temp 98.1°F | Resp 16 | Ht 71.0 in | Wt 245.0 lb

## 2014-05-30 DIAGNOSIS — I714 Abdominal aortic aneurysm, without rupture, unspecified: Secondary | ICD-10-CM

## 2014-05-30 DIAGNOSIS — Z95828 Presence of other vascular implants and grafts: Secondary | ICD-10-CM

## 2014-05-30 MED ORDER — IOHEXOL 350 MG/ML SOLN
75.0000 mL | Freq: Once | INTRAVENOUS | Status: AC | PRN
  Administered 2014-05-30: 75 mL via INTRAVENOUS

## 2014-05-30 NOTE — Progress Notes (Signed)
POST OPERATIVE OFFICE NOTE    CC:  F/u for surgery  HPI:  This is a 54 y.o. 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  He also had an Amplatzer plug right internal iliac x 2 and multiple Nestor coils for right internal iliac artery aneurysm.  He states he is back to base line.  He is in school full time.  He is eating without difficulty and increasing his walking for exercise.  He reports no right buttock pain that could be associated with the right internal iliac coiling embolization.     No Known Allergies  Current Outpatient Prescriptions  Medication Sig Dispense Refill  . amLODipine (NORVASC) 5 MG tablet Take 1 tablet (5 mg total) by mouth every morning. 90 tablet 3  . aspirin 81 MG tablet Take 81 mg by mouth every morning.     . carvedilol (COREG) 25 MG tablet Take 2 tablets (50 mg total) by mouth every morning. 180 tablet 3  . cholecalciferol (VITAMIN D) 1000 UNITS tablet Take 2,000 Units by mouth daily.    . Ferrous Sulfate (IRON) 325 (65 FE) MG TABS Take 1 tablet by mouth every morning.    Marland Kitchen lisinopril (PRINIVIL,ZESTRIL) 40 MG tablet Take 1 tablet (40 mg total) by mouth every morning. 90 tablet 3  . magnesium oxide (MAG-OX) 400 MG tablet Take 250 mg by mouth every morning.     . Multiple Vitamin (MULTIVITAMIN WITH MINERALS) TABS tablet Take 1 tablet by mouth every morning.    Marland Kitchen oxyCODONE (OXY IR/ROXICODONE) 5 MG immediate release tablet Take 1 tablet (5 mg total) by mouth every 6 (six) hours as needed for moderate pain. 20 tablet 0  . potassium chloride SA (K-DUR,KLOR-CON) 20 MEQ tablet Take 2 tablets (40 mEq total) by mouth every morning. 180 tablet 3  . simvastatin (ZOCOR) 20 MG tablet Take 1 tablet (20 mg total) by mouth every morning. 90 tablet 3  . vitamin B-12 (CYANOCOBALAMIN) 1000 MCG tablet Take 1,000 mcg by mouth daily.     No current facility-administered medications for this visit.     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 Native sac diameter 6.2 cm, previously 6.1 Cm. Interval exclusion of BILATERAL common iliac artery aneurysms and coil embolization of RIGHT internal iliac artery.  Stable 2.2 cm fusiform LEFT internal iliac artery aneurysm.  Incision:  Well healed groin stick sites without drainage or hematoma. Extremities:  Well perfused with palpable DP/PT pulses bilaterally, 2+ femoral pulses Abdomen soft not tender  Assessment/Plan:  This is a 54 y.o. male who is s/p: EVAR and right internal iliac coil embolization  He reports feeling fine and back to his life style baseline.   The CTA measures the left internal iliac aneurysm at 2.2 cm we will continue to watch this for changes in size.  If it becomes 3 cm or greater we will plan to do a coil embolization procedure.   He will follow up in 5 months with a CTA of the abdomen and pelvis.  Activity as tolerates    Theda Sers, [facility] PA-C [facility] [phone]  Clinic MD:  Pt seen and examined with Dr. Oneida Alar  History and exam details as above. Patient doing  well overall. No right buttock claudication. Type II endoleak will need long-term follow-up. Left internal iliac artery aneurysm will also need long-term follow-up. Patient will return with a CT scan of the abdomen and pelvis in 5 months.  [name], MD [facility] [place] Office: [phone] Pager: [phone]

## 2014-06-28 ENCOUNTER — Telehealth: Payer: Self-pay

## 2014-06-28 DIAGNOSIS — I723 Aneurysm of iliac artery: Secondary | ICD-10-CM

## 2014-06-28 DIAGNOSIS — I714 Abdominal aortic aneurysm, without rupture, unspecified: Secondary | ICD-10-CM

## 2014-06-28 NOTE — Telephone Encounter (Signed)
Phone call from pt. with report of an intermittent cramp down the anterior left thigh. Stated he notices this occurs when he 1st getting up in the mornings.  Stated it eases or works out after walking around, then will reoccur later in the day.  Stated he exercises at the gym 2x/wk, and has had episodes of cramping after exercising.  Denies swelling or inflammation of the left leg.  Discussed with Dr. Bridgett Larsson.  Stated the cramping-type complaint is not how the internal iliac artery aneurysm would present.  If pt. not having ischemic-type symptoms, schedule a future appt. for an Aortoiliac duplex, and office visit.  Attempted to call pt.  Left voice message that office scheduler will call him with an appt. Time/date.

## 2014-06-28 NOTE — Telephone Encounter (Signed)
Left message for patient to call back to verify appt and OK the wait in between appts. dpm

## 2014-07-03 ENCOUNTER — Encounter: Payer: Self-pay | Admitting: Vascular Surgery

## 2014-07-04 ENCOUNTER — Ambulatory Visit (INDEPENDENT_AMBULATORY_CARE_PROVIDER_SITE_OTHER): Payer: Self-pay | Admitting: Vascular Surgery

## 2014-07-04 ENCOUNTER — Ambulatory Visit (HOSPITAL_COMMUNITY)
Admission: RE | Admit: 2014-07-04 | Discharge: 2014-07-04 | Disposition: A | Discharge status: Home / Self Care | Payer: 59 | Source: Ambulatory Visit | Attending: Vascular Surgery | Admitting: Vascular Surgery

## 2014-07-04 ENCOUNTER — Encounter: Payer: Self-pay | Admitting: Vascular Surgery

## 2014-07-04 VITALS — BP 134/85 | HR 74 | Temp 98.1°F | Resp 16 | Ht 71.0 in | Wt 254.0 lb

## 2014-07-04 DIAGNOSIS — I714 Abdominal aortic aneurysm, without rupture, unspecified: Secondary | ICD-10-CM

## 2014-07-04 DIAGNOSIS — I723 Aneurysm of iliac artery: Secondary | ICD-10-CM

## 2014-07-04 DIAGNOSIS — Z48812 Encounter for surgical aftercare following surgery on the circulatory system: Secondary | ICD-10-CM | POA: Insufficient documentation

## 2014-07-04 NOTE — Progress Notes (Signed)
HPI:  This is a 54 y.o. 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  He states he is back to base line.  He is in school full time.  He is eating without difficulty and increasing his walking for exercise.  He reports no right buttock pain that could be associated with the right internal iliac coiling embolization.  He denies abdominal or back pain. He does have some occasional burning and stinging in his left anterior thigh and groin.     No Known Allergies    Current Outpatient Prescriptions   Medication  Sig  Dispense  Refill   .  amLODipine (NORVASC) 5 MG tablet  Take 1 tablet (5 mg total) by mouth every morning.  90 tablet  3   .  aspirin 81 MG tablet  Take 81 mg by mouth every morning.        .  carvedilol (COREG) 25 MG tablet  Take 2 tablets (50 mg total) by mouth every morning.  180 tablet  3   .  cholecalciferol (VITAMIN D) 1000 UNITS tablet  Take 2,000 Units by mouth daily.       .  Ferrous Sulfate (IRON) 325 (65 FE) MG TABS  Take 1 tablet by mouth every morning.       Marland Kitchen  lisinopril (PRINIVIL,ZESTRIL) 40 MG tablet  Take 1 tablet (40 mg total) by mouth every morning.  90 tablet  3   .  magnesium oxide (MAG-OX) 400 MG tablet  Take 250 mg by mouth every morning.        .  Multiple Vitamin (MULTIVITAMIN WITH MINERALS) TABS tablet  Take 1 tablet by mouth every morning.       Marland Kitchen  oxyCODONE (OXY IR/ROXICODONE) 5 MG immediate release tablet  Take 1 tablet (5 mg total) by mouth every 6 (six) hours as needed for moderate pain.  20 tablet  0   .  potassium chloride SA (K-DUR,KLOR-CON) 20 MEQ tablet  Take 2 tablets (40 mEq total) by mouth every morning.  180 tablet  3   .  simvastatin (ZOCOR) 20 MG tablet  Take 1 tablet (20 mg total) by mouth every morning.  90 tablet  3   .  vitamin B-12 (CYANOCOBALAMIN) 1000 MCG tablet  Take 1,000 mcg by mouth daily.          No  current facility-administered medications for this visit.      ROS:  he denies shortness of breath. He denies chest pain. He states he is going to start walking more as the weather turns warmer. Physical Exam:     Filed Vitals:   07/04/14 0914  BP: 134/85  Pulse: 74  Temp: 98.1 F (36.7 C)  TempSrc: Oral  Resp: 16  Height: 5\' 11"  (1.803 m)  Weight: 254 lb (115.214 kg)  SpO2: 100%    Extremities: palpable DP/PT pulses bilaterally, 2+ femoral pulses Abdomen soft not tender Chest: Clear to auscultation bilaterally Cardiac: Regular rate and rhythm.  Data: Ultrasound the abdominal aorta was performed today. This shows aneurysm diameter has shrunk from 6.22 5.6 cm. The right common iliac measures 3.12 cm but is excluded. There was a type II endoleak from probably a lumbar artery. Left internal iliac artery aneurysm was 2 cm.  Assessment/Plan:  #1 well sealed infrarenal aortic aneurysm and common iliac aneurysms. The right internal iliac  artery aneurysm has been embolized. The left internal iliac aneurysm at 2 cm we will continue to watch this for changes in size.  the type II endoleak will be observed as long as he has no changes in aneurysm diameter. He will follow-up with an aortic ultrasound in 6 months time.   [name], MD [facility] [place] Office: [phone] Pager: [phone]

## 2014-07-30 ENCOUNTER — Telehealth: Payer: Self-pay

## 2014-07-30 DIAGNOSIS — Z8679 Personal history of other diseases of the circulatory system: Secondary | ICD-10-CM

## 2014-07-30 DIAGNOSIS — R52 Pain, unspecified: Secondary | ICD-10-CM

## 2014-07-30 DIAGNOSIS — Z95828 Presence of other vascular implants and grafts: Secondary | ICD-10-CM

## 2014-07-30 DIAGNOSIS — I714 Abdominal aortic aneurysm, without rupture, unspecified: Secondary | ICD-10-CM

## 2014-07-30 NOTE — Telephone Encounter (Signed)
Phone call from pt.  Reported he continues to have pain in his left leg with walking.  Stated the pain starts at the top of the buttocks, and travels down through the left leg.  Stated he usually walks approx. 1.5 miles. At a time, and now noticies increased pain at about 1/4 of his usual distance.  Stated it does relieve with rest, but feels the intensity has progressed, and the onset is much sooner in his walking regimin. Questions if this is something he is going to have to live with.  Will make Dr. Oneida Alar aware of symptoms and call pt. with recommendations.  Agrees with plan.

## 2014-07-31 NOTE — Telephone Encounter (Signed)
I spoke with patient to schedule his appointments. He is scheduled to see [facility] on 08/02/14 @ 4:45pm and then to see [facility] on 08/15/14. Pt agrees with plan. dpm

## 2014-07-31 NOTE — Telephone Encounter (Signed)
Discussed pt's symptoms with Dr. Oneida Alar.  Recommended to schedule CTA Abd/ Pelvis and office visit at next available.  Will contact pt. To schedule.

## 2014-08-02 ENCOUNTER — Telehealth: Payer: Self-pay

## 2014-08-02 ENCOUNTER — Ambulatory Visit
Admission: RE | Admit: 2014-08-02 | Discharge: 2014-08-02 | Disposition: A | Discharge status: Home / Self Care | Payer: 59 | Source: Ambulatory Visit | Attending: Vascular Surgery | Admitting: Vascular Surgery

## 2014-08-02 DIAGNOSIS — R52 Pain, unspecified: Secondary | ICD-10-CM

## 2014-08-02 DIAGNOSIS — I714 Abdominal aortic aneurysm, without rupture, unspecified: Secondary | ICD-10-CM

## 2014-08-02 DIAGNOSIS — Z95828 Presence of other vascular implants and grafts: Secondary | ICD-10-CM

## 2014-08-02 MED ORDER — IOPAMIDOL (ISOVUE-370) INJECTION 76%
75.0000 mL | Freq: Once | INTRAVENOUS | Status: AC | PRN
  Administered 2014-08-02: 75 mL via INTRAVENOUS

## 2014-08-02 NOTE — Telephone Encounter (Signed)
Left pt. voicemail re: Dr. Oneida Alar recommendation that pt. must be reevaluated for current symptoms, before considering writing letter for Disability.

## 2014-08-02 NOTE — Telephone Encounter (Signed)
-----   Message from Elam Dutch, MD sent at 08/01/2014  4:56 PM EDT ----- Regarding: RE: request for letter/ need advise Needs to be seen prior to considering this.  Was basically released for full activity last visit  Juanda Crumble ----- Message -----    From: Denman George, RN    Sent: 08/01/2014   3:56 PM      To: Elam Dutch, MD Subject: request for letter/ need advise                Please review his notes/ s/p EVAR and Coil Emboliz. (R) IIA 04/2014; recently triaged him re: c/o increased freq. and intensity of pain in left LE from buttocks down through leg.  He was given appt. For f/u on 08/15/14 re: above symptoms.  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  I am not sure how to handle this prior to you reevaluating him. Please advise.

## 2014-08-14 ENCOUNTER — Encounter: Payer: Self-pay | Admitting: Vascular Surgery

## 2014-08-15 ENCOUNTER — Ambulatory Visit (INDEPENDENT_AMBULATORY_CARE_PROVIDER_SITE_OTHER): Payer: 59 | Admitting: Vascular Surgery

## 2014-08-15 ENCOUNTER — Encounter: Payer: Self-pay | Admitting: Vascular Surgery

## 2014-08-15 VITALS — BP 132/87 | HR 69 | Ht 71.0 in | Wt 258.4 lb

## 2014-08-15 DIAGNOSIS — M79605 Pain in left leg: Secondary | ICD-10-CM

## 2014-08-15 DIAGNOSIS — Z95828 Presence of other vascular implants and grafts: Secondary | ICD-10-CM

## 2014-08-15 NOTE — Progress Notes (Signed)
HPI:  This is a 54 y.o. 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   He reports no right buttock pain that could be associated with the right internal iliac coiling embolization.  He denies abdominal or back pain. He does have some occasional burning and stinging in his left anterior thigh and groin. This has progressively worsened over the last few weeks. He now has a throbbing pain on the inner aspect of his left leg. This can be exacerbated by walking but sometimes occurs at rest. It is a nagging type pain. It has limited his ability to take long walks. He denies rest pain in the foot.  No Known Allergies   Current Outpatient Prescriptions on File Prior to Visit  Medication Sig Dispense Refill  . amLODipine (NORVASC) 5 MG tablet Take 1 tablet (5 mg total) by mouth every morning. (Patient taking differently: Take 10 mg by mouth every morning. ) 90 tablet 3  . aspirin 81 MG tablet Take 81 mg by mouth every morning.     . carvedilol (COREG) 25 MG tablet Take 2 tablets (50 mg total) by mouth every morning. 180 tablet 3  . cholecalciferol (VITAMIN D) 1000 UNITS tablet Take 2,000 Units by mouth daily.    . Ferrous Sulfate (IRON) 325 (65 FE) MG TABS Take 1 tablet by mouth every morning.    Marland Kitchen lisinopril (PRINIVIL,ZESTRIL) 40 MG tablet Take 1 tablet (40 mg total) by mouth every morning. 90 tablet 3  . magnesium oxide (MAG-OX) 400 MG tablet Take 250 mg by mouth every morning.     . Multiple Vitamin (MULTIVITAMIN WITH MINERALS) TABS tablet Take 1 tablet by mouth every morning.    . potassium chloride SA (K-DUR,KLOR-CON) 20 MEQ tablet Take 2 tablets (40 mEq total) by mouth every morning. 180 tablet 3  . simvastatin (ZOCOR) 20 MG tablet Take 1 tablet (20 mg total) by mouth every morning. 90 tablet 3  . vitamin B-12 (CYANOCOBALAMIN) 1000 MCG tablet Take 1,000 mcg by mouth daily.     Marland Kitchen oxyCODONE (OXY IR/ROXICODONE) 5 MG immediate release tablet Take 1 tablet (5 mg total) by mouth every 6 (six) hours as needed for moderate pain. (Patient not taking: Reported on 08/15/2014) 20 tablet 0   No current facility-administered medications on file prior to visit.   Past Surgical History  Procedure Laterality Date  . Laparoscopic gastric bypass  2010    [facility]  . Laparoscopy N/A 03/06/2013    Procedure: LAPAROSCOPY DIAGNOSTIC;  Surgeon: Adin Hector, MD;  Location: [facility];  Service: General;  Laterality: N/A;  . Laparoscopic lysis of adhesions N/A 03/06/2013    Procedure: LAPAROSCOPIC LYSIS OF ADHESIONS AND CLOSURE OF INTERNAL HERNIAS x2;  Surgeon: Adin Hector, MD;  Location: [facility];  Service: General;  Laterality: N/A;  . Coronary artery bypass graft  2001  . Cardiac catheterization    . Back surgery  2009    lower back  . Cystoscopy with retrograde pyelogram, ureteroscopy and stent placement Bilateral 02/07/2014    Procedure: CYSTOSCOPY WITH BILATERAL RETROGRADE PYELOGRAM, BILATERAL URETEROSCOPY, RIGHT EXTRACTION OF STONES AND RIGHT STENT PLACEMENT;  Surgeon: Jorja Loa, MD;  Location: [facility];  Service: Urology;  Laterality: Bilateral;  . Abdominal aortic endovascular stent graft N/A 04/30/2014    Procedure: ABDOMINAL AORTIC ENDOVASCULAR STENT GRAFT;  Surgeon: Elam Dutch, MD;  Location: [facility];  Service: Vascular;  Laterality: N/A;  . Peripheral vascular catheterization Right 04/30/2014    Procedure: EMBOLIZATION right internal iliac;  Surgeon: Elam Dutch, MD;  Location: [place];  Service: Vascular;  Laterality: Right;     ROS:  he denies shortness of breath. He denies chest pain. He states he is going to start walking more as the weather turns warmer.  Physical Exam:    Filed Vitals:   08/15/14 1321  BP: 132/87  Pulse: 69  Height: 5\' 11"  (1.803 m)  Weight: 258 lb 6.4 oz (117.209 kg)  SpO2: 96%    Extremities: palpable DP/PT pulses  bilaterally, 2+ femoral pulses Abdomen soft not tender Chest: Clear to auscultation bilaterally Cardiac: Regular rate and rhythm.  Data: Recent CT angiogram of the abdomen and pelvis is reviewed today. Aneurysm has decreased in size from 6.2 to 5.9 cm in diameter. The right internal iliac artery aneurysm as well excluded from coil embolization. 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  Assessment/Plan:  #1 well sealed infrarenal aortic aneurysm and common iliac aneurysms. The right internal iliac artery aneurysm has been embolized. The left internal iliac aneurysm at 2 cm we will continue to watch this for changes in size and consider repair of her reaches 3 cm diameter.  The type II endoleak will be observed as long as he has no changes in aneurysm diameter. He will follow-up with an aortic ultrasound in 6 months time. We will do noninvasive arterial Dopplers and ABIs at that time as well with exercise to make sure that this is not some sort of lower extremity arterial occlusive disease problem although his symptoms did not really suggest that.  Left inner thigh pain sounds like peripheral sensory nerve irritation primarily over the course of the saphenous nerve. This may be related to percutaneous access on the left side. Hopefully this will continue to improve with time. If the patient's problems continue to worsen over the next 3 months we will consider physical therapy appointment for further evaluation. Otherwise he'll return in 3 months time.  [name], MD [facility] Office: [phone] Pager: [phone]

## 2014-08-15 NOTE — Addendum Note (Signed)
Addended by: Mena Goes on: 08/15/2014 03:44 PM   Modules accepted: Orders

## 2014-08-19 DIAGNOSIS — Z0279 Encounter for issue of other medical certificate: Secondary | ICD-10-CM | POA: Diagnosis not present

## 2014-09-11 ENCOUNTER — Ambulatory Visit (INDEPENDENT_AMBULATORY_CARE_PROVIDER_SITE_OTHER): Payer: 59 | Admitting: Interventional Cardiology

## 2014-09-11 ENCOUNTER — Encounter: Payer: Self-pay | Admitting: Interventional Cardiology

## 2014-09-11 VITALS — BP 120/76 | HR 47 | Ht 71.0 in | Wt 255.4 lb

## 2014-09-11 DIAGNOSIS — M79652 Pain in left thigh: Secondary | ICD-10-CM

## 2014-09-11 DIAGNOSIS — I714 Abdominal aortic aneurysm, without rupture, unspecified: Secondary | ICD-10-CM

## 2014-09-11 DIAGNOSIS — I10 Essential (primary) hypertension: Secondary | ICD-10-CM

## 2014-09-11 DIAGNOSIS — R0989 Other specified symptoms and signs involving the circulatory and respiratory systems: Secondary | ICD-10-CM

## 2014-09-11 DIAGNOSIS — E785 Hyperlipidemia, unspecified: Secondary | ICD-10-CM

## 2014-09-11 DIAGNOSIS — I25810 Atherosclerosis of coronary artery bypass graft(s) without angina pectoris: Secondary | ICD-10-CM

## 2014-09-11 NOTE — Patient Instructions (Signed)
Medication Instructions:  Your physician recommends that you continue on your current medications as directed. Please refer to the Current Medication list given to you today.   Labwork: None   Testing/Procedures: Your physician has requested that you have a carotid duplex. This test is an ultrasound of the carotid arteries in your neck. It looks at blood flow through these arteries that supply the brain with blood. Allow one hour for this exam. There are no restrictions or special instructions.   Follow-Up: Your physician wants you to follow-up in: 9 months with Dr.Smith You will receive a reminder letter in the mail two months in advance. If you don't receive a letter, please call our office to schedule the follow-up appointment.   Any Other Special Instructions Will Be Listed Below (If Applicable).

## 2014-09-11 NOTE — Progress Notes (Signed)
Cardiology Office Note   Date:  09/11/2014   ID:  Daniel Blevins, DOB 1960-07-10, MRN [id number]  PCP:  Daniel Argyle, MD  Cardiologist:   Daniel Grooms, MD   Chief Complaint  Patient presents with  . CORONARY ARTERY DISEASE OF ARTERY BYPASS GRAFT      History of Present Illness: Daniel Blevins is a 54 y.o. male who presents for  Coronary artery disease with prior coronary bypass grafting (2001), nonischemic myocardial perfusion study, 2015, hypertension, abdominal aortic aneurysm status post treatment with stent graft and  hyperlipidemia,   his only complaint is discomfort in the medial thigh that radiates down his leg. This occurred after he underwent stent grafting of his abdominal aortic aneurysm. The discomfort is intermittent. He can be brought on by physical activity. It goes away with rest.   He denies angina and dyspnea. He has had no prolonged palpitations.  Past Medical History  Diagnosis Date  . Hx of CABG   . Hypertension   . 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 Coronary artery disease   . History of kidney stones   . Deafness in right ear     Past Surgical History  Procedure Laterality Date  . Laparoscopic gastric bypass  2010    [facility]  . Laparoscopy N/A 03/06/2013    Procedure: LAPAROSCOPY DIAGNOSTIC;  Surgeon: Adin Hector, MD;  Location: [facility];  Service: General;  Laterality: N/A;  . Laparoscopic lysis of adhesions N/A 03/06/2013    Procedure: LAPAROSCOPIC LYSIS OF ADHESIONS AND CLOSURE OF INTERNAL HERNIAS x2;  Surgeon: Adin Hector, MD;  Location: [facility];  Service: General;  Laterality: N/A;  . Coronary artery bypass graft  2001  . Cardiac catheterization    . Back surgery  2009    lower back  . Cystoscopy with retrograde pyelogram, ureteroscopy and stent placement Bilateral 02/07/2014    Procedure: CYSTOSCOPY WITH BILATERAL RETROGRADE PYELOGRAM, BILATERAL URETEROSCOPY, RIGHT EXTRACTION OF STONES AND RIGHT STENT PLACEMENT;   Surgeon: Jorja Loa, MD;  Location: [facility];  Service: Urology;  Laterality: Bilateral;  . Abdominal aortic endovascular stent graft N/A 04/30/2014    Procedure: ABDOMINAL AORTIC ENDOVASCULAR STENT GRAFT;  Surgeon: Elam Dutch, MD;  Location: [place];  Service: Vascular;  Laterality: N/A;  . Peripheral vascular catheterization Right 04/30/2014    Procedure: EMBOLIZATION right internal iliac;  Surgeon: Elam Dutch, MD;  Location: [place];  Service: Vascular;  Laterality: Right;     Current Outpatient Prescriptions  Medication Sig Dispense Refill  . amLODipine (NORVASC) 10 MG tablet Take 10 mg by mouth daily.    Marland Kitchen aspirin 81 MG tablet Take 81 mg by mouth every morning.     . carvedilol (COREG) 25 MG tablet Take 2 tablets (50 mg total) by mouth every morning. 180 tablet 3  . cholecalciferol (VITAMIN D) 1000 UNITS tablet Take 2,000 Units by mouth daily.    . Ferrous Sulfate (IRON) 325 (65 FE) MG TABS Take 1 tablet by mouth every morning.    Marland Kitchen lisinopril (PRINIVIL,ZESTRIL) 40 MG tablet Take 1 tablet (40 mg total) by mouth every morning. 90 tablet 3  . magnesium oxide (MAG-OX) 400 MG tablet Take 250 mg by mouth every morning.     . Multiple Vitamin (MULTIVITAMIN WITH MINERALS) TABS tablet Take 1 tablet by mouth every morning.    . potassium chloride SA (K-DUR,KLOR-CON) 20 MEQ tablet Take 2 tablets (40 mEq total) by mouth every morning. 180 tablet  3  . simvastatin (ZOCOR) 20 MG tablet Take 1 tablet (20 mg total) by mouth every morning. 90 tablet 3  . vitamin B-12 (CYANOCOBALAMIN) 1000 MCG tablet Take 1,000 mcg by mouth daily.     No current facility-administered medications for this visit.    Allergies:   Review of patient's allergies indicates no known allergies.    Social History:  The patient  reports that he has never smoked. He has never used smokeless tobacco. He reports that he does not drink alcohol or use illicit drugs.   Family History:  The patient's family history  includes AAA (abdominal aortic aneurysm) in his father; Cancer in his mother; Lung cancer in his mother.    ROS:  Please see the history of present illness.   Otherwise, review of systems are positive for none.   All other systems are reviewed and negative.    PHYSICAL EXAM: VS:  BP 120/76 mmHg  Pulse 47  Ht 5\' 11"  (1.803 m)  Wt 255 lb 6.4 oz (115.849 kg)  BMI 35.64 kg/m2  SpO2 97% , BMI Body mass index is 35.64 kg/(m^2). GEN: Well nourished, well developed, in no acute distress HEENT: normal Neck: no JVD.  A faint Left carotid bruit is heard.  No masses  Are noted Cardiac: RRR; no murmurs, rubs, or gallops,no edema  Respiratory:  clear to auscultation bilaterally, normal work of breathing GI: soft, nontender, nondistended, + BS MS: no deformity or atrophy Skin: warm and dry, no rash Neuro:  Strength and sensation are intact Psych: euthymic mood, full affect   EKG:  EKG is not ordered today.    Recent Labs: 04/29/2014: ALT 24 04/30/2014: Magnesium 1.8 05/01/2014: BUN 11; Creatinine 1.09; Hemoglobin 10.1*; Platelets 94*; Potassium 3.1*; Sodium 140    Lipid Panel No results found for: CHOL, TRIG, HDL, CHOLHDL, VLDL, LDLCALC, LDLDIRECT    Wt Readings from Last 3 Encounters:  09/11/14 255 lb 6.4 oz (115.849 kg)  08/15/14 258 lb 6.4 oz (117.209 kg)  07/04/14 254 lb (115.214 kg)      Other studies Reviewed: Additional studies/ records that were reviewed today include: .    ASSESSMENT AND PLAN:  AAA (abdominal aortic aneurysm) - 5cm infrarenal by CTscan [id number]  , treated with stent graft in 2015 by Dr. Dayton Scrape.  Coronary artery disease involving coronary bypass graft of native heart without angina pectoris  Essential hypertension : Excellent control  Faint left carotid bruit.   Current medicines are reviewed at length with the patient today.  The patient does not have concerns regarding medicines.  The following changes have been made:  no change . We will  perform a bilateral carotid Doppler study  Labs/ tests ordered today include:  No orders of the defined types were placed in this encounter.     Disposition:   FU with HS in 9 months  Signed, Daniel Grooms, MD  09/11/2014 9:43 AM    [address], [place], [place]  [address] Phone: [phone]; Fax: [phone]

## 2014-09-20 ENCOUNTER — Ambulatory Visit (HOSPITAL_COMMUNITY): Payer: 59 | Attending: Interventional Cardiology

## 2014-09-20 DIAGNOSIS — E785 Hyperlipidemia, unspecified: Secondary | ICD-10-CM | POA: Insufficient documentation

## 2014-09-20 DIAGNOSIS — R0989 Other specified symptoms and signs involving the circulatory and respiratory systems: Secondary | ICD-10-CM | POA: Diagnosis not present

## 2014-09-20 DIAGNOSIS — I2510 Atherosclerotic heart disease of native coronary artery without angina pectoris: Secondary | ICD-10-CM | POA: Diagnosis not present

## 2014-09-20 DIAGNOSIS — I739 Peripheral vascular disease, unspecified: Secondary | ICD-10-CM | POA: Insufficient documentation

## 2014-09-20 DIAGNOSIS — I10 Essential (primary) hypertension: Secondary | ICD-10-CM | POA: Diagnosis not present

## 2014-09-20 DIAGNOSIS — Z951 Presence of aortocoronary bypass graft: Secondary | ICD-10-CM | POA: Diagnosis not present

## 2014-09-25 ENCOUNTER — Telehealth: Payer: Self-pay

## 2014-09-25 DIAGNOSIS — I6523 Occlusion and stenosis of bilateral carotid arteries: Secondary | ICD-10-CM

## 2014-09-25 NOTE — Telephone Encounter (Signed)
Called give pt carotid results.lmtcb

## 2014-09-25 NOTE — Telephone Encounter (Signed)
Pt ware of carotid results. No significant carotid plaque. F/U study in 2 years. Less than 40% bilaterally. Pt verbalized understanding.

## 2014-09-25 NOTE — Telephone Encounter (Signed)
-----   Message from Belva Crome, MD sent at 09/23/2014  7:01 PM EDT ----- No significant carotid plaque. F/U study in 2 years. Less than 40% bilaterally.

## 2014-10-24 ENCOUNTER — Other Ambulatory Visit: Payer: 59

## 2014-10-28 ENCOUNTER — Other Ambulatory Visit: Payer: Self-pay | Admitting: Orthopaedic Surgery

## 2014-10-28 DIAGNOSIS — M25511 Pain in right shoulder: Secondary | ICD-10-CM

## 2014-10-31 ENCOUNTER — Ambulatory Visit: Payer: 59 | Admitting: Vascular Surgery

## 2014-11-01 ENCOUNTER — Encounter: Payer: Self-pay | Admitting: Vascular Surgery

## 2014-11-07 ENCOUNTER — Ambulatory Visit (INDEPENDENT_AMBULATORY_CARE_PROVIDER_SITE_OTHER): Payer: 59 | Admitting: Vascular Surgery

## 2014-11-07 ENCOUNTER — Ambulatory Visit
Admission: RE | Admit: 2014-11-07 | Discharge: 2014-11-07 | Disposition: A | Discharge status: Home / Self Care | Payer: 59 | Source: Ambulatory Visit | Attending: Vascular Surgery | Admitting: Vascular Surgery

## 2014-11-07 ENCOUNTER — Inpatient Hospital Stay: Admission: RE | Admit: 2014-11-07 | Payer: 59 | Source: Ambulatory Visit

## 2014-11-07 ENCOUNTER — Encounter: Payer: Self-pay | Admitting: Vascular Surgery

## 2014-11-07 VITALS — BP 128/84 | HR 55 | Ht 71.0 in | Wt 253.8 lb

## 2014-11-07 DIAGNOSIS — I714 Abdominal aortic aneurysm, without rupture, unspecified: Secondary | ICD-10-CM

## 2014-11-07 DIAGNOSIS — Z95828 Presence of other vascular implants and grafts: Secondary | ICD-10-CM

## 2014-11-07 DIAGNOSIS — R1032 Left lower quadrant pain: Secondary | ICD-10-CM | POA: Insufficient documentation

## 2014-11-07 DIAGNOSIS — R1030 Lower abdominal pain, unspecified: Secondary | ICD-10-CM

## 2014-11-07 MED ORDER — IOPAMIDOL (ISOVUE-370) INJECTION 76%
75.0000 mL | Freq: Once | INTRAVENOUS | Status: AC | PRN
  Administered 2014-11-07: 75 mL via INTRAVENOUS

## 2014-11-07 NOTE — Progress Notes (Signed)
    HPI:  This is a 54 y.o. 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   He reports no right buttock pain that could be associated with the right internal iliac coiling embolization.  He denies abdominal or back pain. He does have some occasional burning and stinging in his left anterior thigh and groin. This has progressively worsened since his operation.Marland Kitchen He now has a throbbing pain on the inner aspect of his left leg. This can be exacerbated by walking but sometimes occurs at rest. It is a nagging type pain. It has limited his ability to take long walks. He states that he has even had to resort to using a scooter in the grocery store due to pain in this area. He denies rest pain in the foot.  ROS:  he denies shortness of breath. He denies chest pain. He states his walking has been limited by the pain in the left inner thigh.  Physical Exam:    Filed Vitals:   11/07/14 1149  BP: 128/84  Pulse: 55  Height: 5\' 11"  (1.803 m)  Weight: 253 lb 12.8 oz (115.123 kg)  SpO2: 96%   Extremities: palpable DP/PT pulses bilaterally, 2+ femoral pulses Abdomen soft not tender Chest: Clear to auscultation bilaterally Cardiac: Regular rate and rhythm.  Data: Recent CT angiogram of the abdomen and pelvis is reviewed today. Aneurysm is essentially unchanged 5.7 x 6.2 cm today.. The right internal iliac artery aneurysm as well excluded from coil embolization. 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 The left internal iliac artery aneurysm is stable in appearance and is 2.4 cm in diameter.  Assessment/Plan:  #1 well sealed infrarenal aortic aneurysm and common iliac aneurysms. The right internal iliac artery aneurysm has been embolized. The left internal iliac aneurysm at 2.4 cm we will continue to watch this for changes in size and consider repair  of her reaches 3 cm diameter.  The type II endoleak will be observed as long as he has no changes in aneurysm diameter. He will follow-up with a CT angiogram abdomen and pelvis in 5 months.  Left inner thigh pain sounds like peripheral sensory nerve irritation primarily over the course of the saphenous nerve. This may be related to percutaneous access on the left side. Hopefully this will continue to improve with time. I have referred him to [facility] for evaluation to see if the size or possible acupuncture therapy may help somewhat with this.   [name], MD [facility] Office: [phone] Pager: [phone]

## 2014-11-07 NOTE — Addendum Note (Signed)
Addended by: Dorthula Rue L on: 11/07/2014 02:44 PM   Modules accepted: Orders

## 2014-11-10 ENCOUNTER — Ambulatory Visit
Admission: RE | Admit: 2014-11-10 | Discharge: 2014-11-10 | Disposition: A | Discharge status: Home / Self Care | Payer: 59 | Source: Ambulatory Visit | Attending: Orthopaedic Surgery | Admitting: Orthopaedic Surgery

## 2014-11-10 DIAGNOSIS — M25511 Pain in right shoulder: Secondary | ICD-10-CM

## 2014-11-21 ENCOUNTER — Encounter (HOSPITAL_COMMUNITY): Payer: 59

## 2014-11-21 ENCOUNTER — Ambulatory Visit: Payer: 59 | Admitting: Vascular Surgery

## 2014-11-21 ENCOUNTER — Other Ambulatory Visit (HOSPITAL_COMMUNITY): Payer: 59

## 2014-12-09 ENCOUNTER — Other Ambulatory Visit: Payer: Self-pay | Admitting: *Deleted

## 2014-12-09 DIAGNOSIS — I10 Essential (primary) hypertension: Secondary | ICD-10-CM

## 2014-12-09 MED ORDER — CARVEDILOL 25 MG PO TABS: 50.0000 mg | ORAL_TABLET | ORAL | Status: DC

## 2014-12-13 ENCOUNTER — Telehealth: Payer: Self-pay | Admitting: Interventional Cardiology

## 2014-12-13 DIAGNOSIS — I10 Essential (primary) hypertension: Secondary | ICD-10-CM

## 2014-12-13 NOTE — Telephone Encounter (Signed)
New message      Need clarification on dosage on coreg----presc was faxed with 2 different set of directions.  Use reference number [id number]

## 2014-12-16 MED ORDER — CARVEDILOL 25 MG PO TABS: 50.0000 mg | ORAL_TABLET | ORAL | Status: DC

## 2014-12-16 NOTE — Telephone Encounter (Signed)
Called to verify how pt is currently taking his Carvedilol. Pt reports he take Carvedilol 50mg (2)25mg  tabs)  daily  Called [employer] [facility] adv them that pt is taking Carvedilol as previously e-scribed  They rqst it be e-scribed again.

## 2014-12-31 ENCOUNTER — Ambulatory Visit: Payer: 59 | Attending: Orthopaedic Surgery | Admitting: Physical Therapy

## 2014-12-31 DIAGNOSIS — M25511 Pain in right shoulder: Secondary | ICD-10-CM

## 2014-12-31 DIAGNOSIS — M75111 Incomplete rotator cuff tear or rupture of right shoulder, not specified as traumatic: Secondary | ICD-10-CM | POA: Insufficient documentation

## 2014-12-31 DIAGNOSIS — R293 Abnormal posture: Secondary | ICD-10-CM | POA: Insufficient documentation

## 2014-12-31 NOTE — Patient Instructions (Signed)
Rotation: External (Single Arm)   Side toward anchor in shoulder width stance with elbow bent to 90, arm across mid-section. Thumb up, pull arm away from body, keeping elbow bent. Repeat _10-20_ times per set. Repeat with other arm. Do _1-2_ sets per session. Do _5-7_ sessions per week. Anchor Height: Waist CAN ALSO HOLD BAND IN EACH HAND ELBOWS IN TOWARDS YOUR RIBS AND PULL BAND IN OPPOSITE DIRECTION http://tub.exer.us/115   Copyright  VHI. All rights reserved.  .  http://orth.exer.us/954   Copyright  VHI. All rights reserved.  Resisted Horizontal Abduction: Bilateral   Sit or stand, tubing in both hands, arms out in front. Keeping arms straight, pinch shoulder blades together and stretch arms out. Repeat ____10-20 times per set. Do ___1-2_ sets per session. Do __2__ sessions per day.  http://orth.exer.us/968   Copyright  VHI. All rights reserved.

## 2014-12-31 NOTE — Therapy (Signed)
[place], [place], [address] Phone: [phone]   Fax:  [phone]  Physical Therapy Evaluation  Patient Details  Name: Daniel Blevins MRN: [id number] Date of Birth: 1961/04/25 Referring Provider:  Mcarthur Rossetti*  Encounter Date: 12/31/2014      PT End of Session - 12/31/14 1039    Visit Number 1   Number of Visits 16   Date for PT Re-Evaluation 02/25/15   PT Start Time 0936   PT Stop Time 1012   PT Time Calculation (min) 36 min   Equipment Utilized During Treatment Gait belt   Activity Tolerance Patient tolerated treatment well   Behavior During Therapy [facility] for tasks assessed/performed      Past Medical History  Diagnosis Date  . Hx of CABG   . Hypertension   . 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 Coronary artery disease   . History of kidney stones   . Deafness in right ear     Past Surgical History  Procedure Laterality Date  . Laparoscopic gastric bypass  2010    [facility]  . Laparoscopy N/A 03/06/2013    Procedure: LAPAROSCOPY DIAGNOSTIC;  Surgeon: Adin Hector, MD;  Location: [facility];  Service: General;  Laterality: N/A;  . Laparoscopic lysis of adhesions N/A 03/06/2013    Procedure: LAPAROSCOPIC LYSIS OF ADHESIONS AND CLOSURE OF INTERNAL HERNIAS x2;  Surgeon: Adin Hector, MD;  Location: [facility];  Service: General;  Laterality: N/A;  . Coronary artery bypass graft  2001  . Cardiac catheterization    . Back surgery  2009    lower back  . Cystoscopy with retrograde pyelogram, ureteroscopy and stent placement Bilateral 02/07/2014    Procedure: CYSTOSCOPY WITH BILATERAL RETROGRADE PYELOGRAM, BILATERAL URETEROSCOPY, RIGHT EXTRACTION OF STONES AND RIGHT STENT PLACEMENT;  Surgeon: Jorja Loa, MD;  [facility];  Service: Urology;  Laterality: Bilateral;  . Abdominal aortic endovascular stent graft N/A 04/30/2014    Procedure: ABDOMINAL AORTIC ENDOVASCULAR STENT  GRAFT;  Surgeon: Elam Dutch, MD;  Location: [place];  Service: Vascular;  Laterality: N/A;  . Peripheral vascular catheterization Right 04/30/2014    Procedure: EMBOLIZATION right internal iliac;  Surgeon: Elam Dutch, MD;  Location: [place];  Service: Vascular;  Laterality: Right;    There were no vitals filed for this visit.  Visit Diagnosis:  Abnormal posture - Plan: CANCELED: PT plan of care cert/re-cert  Right shoulder pain - Plan: CANCELED: PT plan of care cert/re-cert  Incomplete rotator cuff tear, right - Plan: CANCELED: PT plan of care cert/re-cert      Subjective Assessment - 12/31/14 0935    Subjective Pt presents with chronic Rt. shoulder pain for over 8-10 mos.  He reports difficulty using  Rt. hand for lifting, turning knobs, cooking, dressing.  Admits to weakness and sensory disturbance at times.  Decr grip. Sitting long hours at computer for school is difficult as well.    Pertinent History lumbar fusion (pt unsure) 2009 and groin pain, AAA with stent Dec. 2015   Limitations Lifting;House hold activities;Writing;Sitting   How long can you sit comfortably? Rt arm starts to hurt after 15-20 min    Diagnostic tests MRI showed Partial thickness bursal surface tear of the distal anterior 11/2014   Patient Stated Goals Pt wants to get rid of pain and avoid surgery.    Currently in Pain? Yes   Pain Score 4   10/10 after mowing the lawn  Pain Location Shoulder   Pain Orientation Right;Posterior;Proximal   Pain Type Chronic pain   Pain Radiating Towards mid humerus   Pain Onset More than a month ago   Pain Frequency Constant   Aggravating Factors  using arm, mowing the lawn, sleeping on it   Pain Relieving Factors rest, does not really take pain meds or use ice   Effect of Pain on Daily Activities slows him down   Multiple Pain Sites No            [facility] PT Assessment - 12/31/14 0939    Assessment   Medical Diagnosis Rt. shoulder pain (partial tear)    Onset  Date/Surgical Date 05/02/15   Hand Dominance Right   Prior Therapy Not for shoulder   Precautions   Precaution Comments Don't lift with Rt. UE    Restrictions   Weight Bearing Restrictions No   Balance Screen   Has the patient fallen in the past 6 months No  Fell 2 yrs lost balance, will F/U with a screen    [place] residence   Prior Function   Level of Independence Independent   Vocation Student  getting MBA, mostly online   Leisure dogs, cooking has 2 grown children not in town   Cognition   Overall Cognitive Status Within Functional Limits for tasks assessed   Observation/Other Assessments   Focus on Therapeutic Outcomes (FOTO)  63%  goal 36%   Sensation   Light Touch Appears Intact   Coordination   Gross Motor Movements are Fluid and Coordinated Not tested   Posture/Postural Control   Posture/Postural Control Postural limitations   Postural Limitations Forward head;Decreased lumbar lordosis;Decreased thoracic kyphosis;Posterior pelvic tilt;Flexed trunk  high Rt. shoulder   Posture Comments hinge at mid thoracic spine into flexion, Rt. scapula abducted and protracted   AROM   Right Shoulder Flexion 133 Degrees   Right Shoulder ABduction 120 Degrees   Right Shoulder Internal Rotation 65 Degrees  T9   Right Shoulder External Rotation 75 Degrees  C7   Left Shoulder Flexion 166 Degrees   Left Shoulder ABduction 135 Degrees   Left Shoulder Internal Rotation 90 Degrees   Left Shoulder External Rotation 90 Degrees   Right/Left Elbow --  WNL   Strength   Right Shoulder Flexion 3+/5   Right Shoulder ABduction 3+/5   Right Shoulder Internal Rotation 3+/5   Right Shoulder External Rotation 3+/5   Left Shoulder Flexion 4/5   Left Shoulder ABduction 4/5   Right/Left Elbow --  Rt. biceps 4+/5   Palpation   Palpation comment sore post shoulder/cuff at infraspinatus and into lateral deltoid, extending to insertion, some soreness  anteriorly.                            PT Education - 12/31/14 1038    Education provided Yes   Education Details PT/POC, HEP and postural symmetry   Person(s) Educated Patient   Methods Explanation;Demonstration;Other (comment)  mirror for visual cues   Comprehension Verbalized understanding;Returned demonstration          PT Short Term Goals - 12/31/14 1043    PT SHORT TERM GOAL #1   Title Pt will be able to verbalize/demo/understand good posture and how it relates to shoulder pathology.    Time 4   Period Weeks   Status New   PT SHORT TERM GOAL #2   Title Pt will be  able to complete computer work (school) for 60 min and report min pain increase in Rt. UE.    Time 4   Period Weeks   Status New   PT SHORT TERM GOAL #3   Title Pt will report pain after yardwork no more than 6/10 (mowing)   Time 4   Period Weeks   Status New   PT SHORT TERM GOAL #4   Title Pt will be able to reach into flexion and abduction with min pain increase to 130 deg for home tasks   Time 4   Period Weeks   Status New   PT SHORT TERM GOAL #5   Title Pt will be I with initial HEP    Time 4   Period Weeks   Status New           PT Long Term Goals - 12/31/14 1049    PT LONG TERM GOAL #1   Title Pt will be I with advanced HEP as of last visit for posture, Rt UE.    Time 8   Period Weeks   Status New   PT LONG TERM GOAL #2   Title Pt will be able to sleep on Rt. side and report min pain    Time 8   Period Weeks   Status New   PT LONG TERM GOAL #3   Title Pt will be able to lift a medium sized sauce pain easily and without pain 75% of the time.    Time 8   Period Weeks   Status New   PT LONG TERM GOAL #4   Title Pt will demo 5/5 strength in Rt. UE in all planes to demo improved strength and stability in RC    Time 8   Period Weeks   Status New   PT LONG TERM GOAL #5   Title Pt will report pain free ADLs   Time 8   Period Weeks   Status New                Plan - 12/31/14 1040    Clinical Impression Statement Patient will benefit from skilled PT to improve shoulder mechanics, pain, strength and restore pain minimized AROM. He reports a remote fall (2 yrs) which he attributes to hip/groin.  May do a quick screen to rule out a fall risk.    Pt will benefit from skilled therapeutic intervention in order to improve on the following deficits Decreased strength;Postural dysfunction;Decreased mobility;Impaired flexibility;Decreased range of motion;Pain;Impaired UE functional use;Increased fascial restricitons   Rehab Potential Excellent   PT Frequency 2x / week   PT Duration 8 weeks   PT Treatment/Interventions ADLs/Self Care Home Management;Cryotherapy;Electrical Stimulation;Iontophoresis 4mg /ml Dexamethasone;Ultrasound;Patient/family education;Taping;Dry needling;Neuromuscular re-education;Passive range of motion;Therapeutic exercise;Therapeutic activities;Functional mobility training;Manual techniques;Other (comment);Moist Heat  Pilates   PT Next Visit Plan review and progress Rt. UE strength, include prone? scapular.  Manual, [place], DGI for balance screen   PT Home Exercise Plan ER/IR with red and horiz abd   Consulted and Agree with Plan of Care Patient       By signing I understand that I am ordering/authorizing the use of Iontophoresis using 4 mg/mL of dexamethasone as a component of this plan of care.   Problem List Patient Active Problem List   Diagnosis Date Noted  . Left groin pain 11/07/2014  . Left thigh pain 09/11/2014  . CAD (coronary artery disease) of artery bypass graft 03/14/2014  . Kidney stones 03/14/2014  . Hyperlipidemia 03/14/2014  .  Essential hypertension 03/14/2014  . SBO (small bowel obstruction) 03/06/2013  . History of gastric bypass 03/06/2013  . AAA (abdominal aortic aneurysm) - 5cm infrarenal by CTscan [id number] 03/06/2013    Jeovanny Cuadros 12/31/2014, 11:02 AM  [place]  [facility] [address] [place], [place], [address] Phone: [phone]   Fax:  [phone]  Raeford Razor, PT 12/31/2014 11:02 AM Phone: [phone] Fax: [phone]

## 2015-01-03 ENCOUNTER — Telehealth: Payer: Self-pay | Admitting: Interventional Cardiology

## 2015-01-03 NOTE — Telephone Encounter (Signed)
New Message  Pt is in the middle of a disability hearing and will need a document of his injection fraction explained in detail. This call was transferred from medical records. Please assist.

## 2015-01-07 ENCOUNTER — Encounter: Payer: Self-pay | Admitting: Vascular Surgery

## 2015-01-08 ENCOUNTER — Ambulatory Visit (INDEPENDENT_AMBULATORY_CARE_PROVIDER_SITE_OTHER): Payer: 59 | Admitting: Vascular Surgery

## 2015-01-08 ENCOUNTER — Encounter: Payer: Self-pay | Admitting: Vascular Surgery

## 2015-01-08 ENCOUNTER — Telehealth: Payer: Self-pay | Admitting: Interventional Cardiology

## 2015-01-08 VITALS — BP 133/85 | HR 63 | Temp 98.0°F | Resp 16 | Ht 71.0 in | Wt 256.0 lb

## 2015-01-08 DIAGNOSIS — R1032 Left lower quadrant pain: Secondary | ICD-10-CM

## 2015-01-08 DIAGNOSIS — R1030 Lower abdominal pain, unspecified: Secondary | ICD-10-CM

## 2015-01-08 NOTE — Progress Notes (Signed)
Patient is a 54 year old male who returns for follow-up today. He underwent repair of infrarenal aortic and bilateral common iliac right internal iliac artery aneurysms with embolization of the right internal iliac artery in December 2015. Since that time he has had an intermittent burning sensation in the left groin which is exacerbated by walking long distances or doing any sort of heavy physical activity. Recent CT scan showed no evidence of inguinal hernia. He has no evidence of pseudoaneurysm. 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 He recently tried acupuncture therapy without benefit. Most likely this represents some form of neuropathy and hopefully will improve with time but currently he is fairly limited in physical activity due to this. He denies claudication. He denies rest pain.  Physical exam:  Filed Vitals:   01/08/15 1359  BP: 133/85  Pulse: 63  Temp: 98 F (36.7 C)  TempSrc: Oral  Resp: 16  Height: 5\' 11"  (1.803 m)  Weight: 256 lb (116.121 kg)  SpO2: 97%    Extremities: 2+ femoral pulses 2+ dorsalis pedis pulses bilaterally  Assessment: Persistent left groin pain primarily with physical exertion hopefully will continue to improve with time no evidence that this is related to his persistent left internal iliac artery aneurysm  Plan: Continue to mobilize as tolerated but obviously this may be somewhat limited by his pain. He'll be scheduled for a follow-up CT Angio the abdomen and pelvis to review his left internal iliac artery aneurysm and recheck his stent in December 2016.  [name], MD [facility] [place] Office: [phone] Pager: [phone]

## 2015-01-08 NOTE — Telephone Encounter (Signed)
New message     Pt calling in regards to low e/f rating; pt needing documentation of e/f rating Pt called last week and has not heard anything regarding the request

## 2015-01-08 NOTE — Telephone Encounter (Signed)
Staff Message sent to [facility] patient is needing Note in writing for his [place] about ejection fraction.

## 2015-01-08 NOTE — Telephone Encounter (Signed)
Message routed to Medical records.

## 2015-01-09 ENCOUNTER — Ambulatory Visit: Payer: 59 | Admitting: Vascular Surgery

## 2015-01-09 ENCOUNTER — Other Ambulatory Visit (HOSPITAL_COMMUNITY): Payer: 59

## 2015-01-09 NOTE — Telephone Encounter (Signed)
Lmom. Letter pt requested has been completed by Dr.Smith. Letter will be left at the front desk for pick up. Pt should call back if further assistance is needed

## 2015-01-13 ENCOUNTER — Ambulatory Visit: Payer: 59 | Attending: Orthopaedic Surgery | Admitting: Physical Therapy

## 2015-01-13 DIAGNOSIS — M75111 Incomplete rotator cuff tear or rupture of right shoulder, not specified as traumatic: Secondary | ICD-10-CM | POA: Diagnosis present

## 2015-01-13 DIAGNOSIS — R293 Abnormal posture: Secondary | ICD-10-CM | POA: Insufficient documentation

## 2015-01-13 DIAGNOSIS — M25511 Pain in right shoulder: Secondary | ICD-10-CM | POA: Insufficient documentation

## 2015-01-13 NOTE — Therapy (Addendum)
[place], [place], [address] Phone: [phone]   Fax:  [phone]  Physical Therapy Treatment  Patient Details  Name: Daniel Blevins MRN: [id number] Date of Birth: 20-Aug-1960 Referring Provider:  Lajean Manes, MD  Encounter Date: 01/13/2015      PT End of Session - 01/13/15 1109    Visit Number 2   Number of Visits 16   Date for PT Re-Evaluation 02/25/15   PT Start Time 1103   PT Stop Time 1145   PT Time Calculation (min) 42 min      Past Medical History  Diagnosis Date  . Hx of CABG   . Hypertension   . 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 Coronary artery disease   . History of kidney stones   . Deafness in right ear     Past Surgical History  Procedure Laterality Date  . Laparoscopic gastric bypass  2010    [facility]  . Laparoscopy N/A 03/06/2013    Procedure: LAPAROSCOPY DIAGNOSTIC;  Surgeon: Adin Hector, MD;  [facility];  Service: General;  Laterality: N/A;  . Laparoscopic lysis of adhesions N/A 03/06/2013    Procedure: LAPAROSCOPIC LYSIS OF ADHESIONS AND CLOSURE OF INTERNAL HERNIAS x2;  Surgeon: Adin Hector, MD;  [facility];  Service: General;  Laterality: N/A;  . Coronary artery bypass graft  2001  . Cardiac catheterization    . Back surgery  2009    lower back  . Cystoscopy with retrograde pyelogram, ureteroscopy and stent placement Bilateral 02/07/2014    Procedure: CYSTOSCOPY WITH BILATERAL RETROGRADE PYELOGRAM, BILATERAL URETEROSCOPY, RIGHT EXTRACTION OF STONES AND RIGHT STENT PLACEMENT;  Surgeon: Jorja Loa, MD;  [facility];  Service: Urology;  Laterality: Bilateral;  . Abdominal aortic endovascular stent graft N/A 04/30/2014    Procedure: ABDOMINAL AORTIC ENDOVASCULAR STENT GRAFT;  Surgeon: Elam Dutch, MD;  Location: [place];  Service: Vascular;  Laterality: N/A;  . Peripheral vascular catheterization Right 04/30/2014    Procedure:  EMBOLIZATION right internal iliac;  Surgeon: Elam Dutch, MD;  Location: [place];  Service: Vascular;  Laterality: Right;    There were no vitals filed for this visit.  Visit Diagnosis:  Abnormal posture  Right shoulder pain  Incomplete rotator cuff tear, right      Subjective Assessment - 01/13/15 1106    Subjective Im always in pain   Currently in Pain? Yes   Pain Score 3   or 4   Pain Location Shoulder   Pain Orientation Right   Pain Descriptors / Indicators Sore   Pain Type Chronic pain   Aggravating Factors  mowing the lawn                         [facility] Adult PT Treatment/Exercise - 01/13/15 1123    Shoulder Exercises: Supine   Horizontal ABduction 15 reps;Theraband   Theraband Level (Shoulder Horizontal ABduction) Level 2 (Red)   External Rotation Both;15 reps;Theraband   Internal Rotation AAROM;Right;15 reps;Theraband   Flexion Right;Both;15 reps;Theraband   Shoulder Exercises: Standing   Extension Strengthening;Both;15 reps   Theraband Level (Shoulder Extension) Level 2 (Red)   Row Strengthening;Both;15 reps;Theraband   Theraband Level (Shoulder Row) Level 2 (Red)   Shoulder Exercises: ROM/Strengthening   UBE (Upper Arm Bike) Level 1.5 forward 3 min back 3 min   Modalities   Modalities Ultrasound   Ultrasound   Ultrasound Location Right lateral  shoulder   Ultrasound Parameters 1.4w/cm2 100%   Ultrasound Goals Pain                PT Education - 01/13/15 1203    Education provided Yes   Education Details Standing scap bands, try current HEP supine to prevent pain   Person(s) Educated Patient   Methods Explanation;Handout   Comprehension Verbalized understanding          PT Short Term Goals - 12/31/14 1043    PT SHORT TERM GOAL #1   Title Pt will be able to verbalize/demo/understand good posture and how it relates to shoulder pathology.    Time 4   Period Weeks   Status New   PT SHORT TERM GOAL #2   Title Pt will be  able to complete computer work (school) for 60 min and report min pain increase in Rt. UE.    Time 4   Period Weeks   Status New   PT SHORT TERM GOAL #3   Title Pt will report pain after yardwork no more than 6/10 (mowing)   Time 4   Period Weeks   Status New   PT SHORT TERM GOAL #4   Title Pt will be able to reach into flexion and abduction with min pain increase to 130 deg for home tasks   Time 4   Period Weeks   Status New   PT SHORT TERM GOAL #5   Title Pt will be I with initial HEP    Time 4   Period Weeks   Status New           PT Long Term Goals - 12/31/14 1049    PT LONG TERM GOAL #1   Title Pt will be I with advanced HEP as of last visit for posture, Rt UE.    Time 8   Period Weeks   Status New   PT LONG TERM GOAL #2   Title Pt will be able to sleep on Rt. side and report min pain    Time 8   Period Weeks   Status New   PT LONG TERM GOAL #3   Title Pt will be able to lift a medium sized sauce pain easily and without pain 75% of the time.    Time 8   Period Weeks   Status New   PT LONG TERM GOAL #4   Title Pt will demo 5/5 strength in Rt. UE in all planes to demo improved strength and stability in RC    Time 8   Period Weeks   Status New   PT LONG TERM GOAL #5   Title Pt will report pain free ADLs   Time 8   Period Weeks   Status New               Plan - 01/13/15 1241    Clinical Impression Statement Pt presents with increased pain during and after HEP as well as continued pain with ADLs. Instructed pt in current HEP in supine position to decrease pain. Pt reports improved tolerance. Also instructed pt in standing scap rows and extensions. All added to HEP, trial of ultrasound to lateral shoulder. Pt reports continued pain post  session with donning shirt.    PT Next Visit Plan review and progress Rt. UE strength, include prone? scapular.  Manual, [place] helpful? , DGI for balance screen        Problem List Patient Active Problem List    Diagnosis  Date Noted  . Left groin pain 11/07/2014  . Left thigh pain 09/11/2014  . CAD (coronary artery disease) of artery bypass graft 03/14/2014  . Kidney stones 03/14/2014  . Hyperlipidemia 03/14/2014  . Essential hypertension 03/14/2014  . SBO (small bowel obstruction) 03/06/2013  . History of gastric bypass 03/06/2013  . AAA (abdominal aortic aneurysm) - 5cm infrarenal by CTscan [id number] 03/06/2013    Dorene Ar, PTA 01/13/2015, 12:45 PM  [place], [place], [address] Phone: [phone]   Fax:  [phone]     PHYSICAL THERAPY DISCHARGE SUMMARY  Visits from Start of Care: 2  Current functional level related to goals / functional outcomes: See above for most recent goals    Remaining deficits: Unknown has not returned.    Education / Equipment: HEP, RICE and posture  Plan: Patient agrees to discharge.  Patient goals were not met. Patient is being discharged due to a change in medical status.  ?????   Has not returned, having groin pain.    Raeford Razor, PT 03/21/2015 8:23 AM Phone: [phone] Fax: [phone]

## 2015-01-13 NOTE — Patient Instructions (Signed)
EXTENSION: Standing - Resistance Band: Stable (Active)   Stand, right arm at side. Against yellow resistance band, draw arm backward, as far as possible, keeping elbow straight. Complete _2__ sets of _15__ repetitions. Perform __2_ sessions per day.    http://ss.exer.us/290   Copyright  VHI. All rights reserved.  Resistive Band Rowing   With resistive band anchored in door, grasp both ends. Keeping elbows bent, pull back, squeezing shoulder blades together. Hold ____ seconds. Repeat ____ times. Do ____ sessions per day.  http://gt2.exer.us/97   Copyright  VHI. All rights reserved.

## 2015-01-15 ENCOUNTER — Ambulatory Visit: Payer: 59 | Admitting: Physical Therapy

## 2015-01-20 ENCOUNTER — Encounter: Payer: 59 | Admitting: Physical Therapy

## 2015-01-22 ENCOUNTER — Encounter: Payer: 59 | Admitting: Physical Therapy

## 2015-01-27 ENCOUNTER — Encounter: Payer: 59 | Admitting: Physical Therapy

## 2015-01-29 ENCOUNTER — Encounter: Payer: 59 | Admitting: Physical Therapy

## 2015-02-03 ENCOUNTER — Encounter: Payer: 59 | Admitting: Physical Therapy

## 2015-02-05 ENCOUNTER — Encounter: Payer: 59 | Admitting: Physical Therapy

## 2015-02-10 ENCOUNTER — Telehealth: Payer: Self-pay

## 2015-02-10 DIAGNOSIS — R1032 Left lower quadrant pain: Principal | ICD-10-CM

## 2015-02-10 DIAGNOSIS — G8929 Other chronic pain: Secondary | ICD-10-CM

## 2015-02-10 NOTE — Telephone Encounter (Signed)
Phone call from pt.  Stated he wanted to make Dr. Oneida Alar aware of the recurring problem in left groin.  Reported that he is "crippled from the pain."  Stated the "pain is present if he walks, or does anything that is physically exertional; ie: shopping or mowing the grass."  Stated if he stops, the pain will ease up, after about 10-15 minutes.  Denied any visible signs of redness/ inflammation, or swelling in the left groin.  Denied fever/ chills.  Stated it is making it very difficult to walk.  Reported that Dr. Oneida Alar has been monitoring this for about 6 mos., and he just wants to know what the plan is.  Advised will report ongoing pain to Dr. Oneida Alar, and return call to patient.  Agrees with plan.

## 2015-02-11 NOTE — Telephone Encounter (Signed)
RE: question about scheduling  Received: Today    Daniel Dutch, MD  Denman George, RN           Refer to pain management   [name]      Attempted to call pt. to advise of Dr. Oneida Alar recommendation to refer to pain management.  Left voice message re: plan, and that he should be notified by phone of appt. @ Pain Management.  Also advised to call office if he has questions.

## 2015-02-12 NOTE — Telephone Encounter (Signed)
Faxed information/referral to [facility] [phone]

## 2015-03-03 ENCOUNTER — Encounter: Payer: Self-pay | Admitting: Family

## 2015-03-05 ENCOUNTER — Encounter: Payer: Self-pay | Admitting: Vascular Surgery

## 2015-04-08 ENCOUNTER — Other Ambulatory Visit: Payer: Self-pay | Admitting: Interventional Cardiology

## 2015-04-10 ENCOUNTER — Ambulatory Visit
Admission: RE | Admit: 2015-04-10 | Discharge: 2015-04-10 | Disposition: A | Discharge status: Home / Self Care | Payer: Managed Care, Other (non HMO) | Source: Ambulatory Visit | Attending: Vascular Surgery | Admitting: Vascular Surgery

## 2015-04-10 DIAGNOSIS — I714 Abdominal aortic aneurysm, without rupture, unspecified: Secondary | ICD-10-CM

## 2015-04-10 DIAGNOSIS — R1032 Left lower quadrant pain: Secondary | ICD-10-CM

## 2015-04-10 MED ORDER — IOPAMIDOL (ISOVUE-370) INJECTION 76%
75.0000 mL | Freq: Once | INTRAVENOUS | Status: AC | PRN
  Administered 2015-04-10: 75 mL via INTRAVENOUS

## 2015-04-11 ENCOUNTER — Encounter: Payer: Self-pay | Admitting: Vascular Surgery

## 2015-04-17 ENCOUNTER — Encounter: Payer: Self-pay | Admitting: Vascular Surgery

## 2015-04-17 ENCOUNTER — Ambulatory Visit (INDEPENDENT_AMBULATORY_CARE_PROVIDER_SITE_OTHER): Payer: Managed Care, Other (non HMO) | Admitting: Vascular Surgery

## 2015-04-17 ENCOUNTER — Ambulatory Visit: Payer: 59 | Admitting: Vascular Surgery

## 2015-04-17 ENCOUNTER — Other Ambulatory Visit: Payer: Self-pay | Admitting: Vascular Surgery

## 2015-04-17 VITALS — BP 138/82 | HR 63 | Ht 71.0 in | Wt 260.5 lb

## 2015-04-17 DIAGNOSIS — I714 Abdominal aortic aneurysm, without rupture, unspecified: Secondary | ICD-10-CM

## 2015-04-17 DIAGNOSIS — T82330D Leakage of aortic (bifurcation) graft (replacement), subsequent encounter: Secondary | ICD-10-CM

## 2015-04-17 DIAGNOSIS — IMO0001 Reserved for inherently not codable concepts without codable children: Secondary | ICD-10-CM

## 2015-04-17 NOTE — Progress Notes (Signed)
54 y/o M s/p Gore Excluder 12/15.   He had embolization of right internal iliac as well with extension to right external and left common. Preoperatively his abdominal aortic aneurysm 6 cm in diameter. Right common iliac was 3 cm left common iliac 2.2 cm.  He has had persistent left groin pain which is fairly continuous in nature. This is also persisted since his operation. He has been somewhat limited in his activity due to pain from this. He returns today for further follow-up. He denies any abdominal or back pain.  Data: CT Angio the abdomen and pelvis is reviewed today. Abdominal aortic aneurysm 6.5 cm in diameter with persistent type II endoleak from multiple lumbar arteries and most likely the inferior mesenteric artery possible type I proximal endoleak but less likely.  Physical exam:  Filed Vitals:   04/17/15 0831  BP: 138/82  Pulse: 63  Height: 5\' 11"  (1.803 m)  Weight: 260 lb 8 oz (118.162 kg)  SpO2: 94%    Assessment: Persistent type II endoleak now with enlargement abdominal aortic aneurysm. #2 persistent left groin painfor improvement which limits his overall mobility  Plan: #1 patient will be evaluated by Dr. Ronnell Guadalajara from interventional radiology for possible coil embolization of lumbar arteries if these are proven to communicate with the aneurysm sac. The patient may need further intervention if he has she has a type I proximal endoleak. The patient will follow-up with me after his evaluation and intervention by Dr. Amelia Jo.  As far as his left groin pain is concerned. This most likely is neuropathic in nature. Hopefully it will continue to improve with time.  [name], MD [facility] [place] Office: [phone] Pager: [phone]

## 2015-04-17 NOTE — Addendum Note (Signed)
Addended by: Dorthula Rue L on: 04/17/2015 02:44 PM   Modules accepted: Orders

## 2015-05-07 ENCOUNTER — Ambulatory Visit
Admission: RE | Admit: 2015-05-07 | Discharge: 2015-05-07 | Disposition: A | Discharge status: Home / Self Care | Payer: Managed Care, Other (non HMO) | Source: Ambulatory Visit | Attending: Vascular Surgery | Admitting: Vascular Surgery

## 2015-05-07 ENCOUNTER — Other Ambulatory Visit: Payer: Self-pay | Admitting: Interventional Radiology

## 2015-05-07 DIAGNOSIS — I714 Abdominal aortic aneurysm, without rupture, unspecified: Secondary | ICD-10-CM

## 2015-05-07 DIAGNOSIS — IMO0002 Reserved for concepts with insufficient information to code with codable children: Secondary | ICD-10-CM | POA: Insufficient documentation

## 2015-05-07 DIAGNOSIS — T82330D Leakage of aortic (bifurcation) graft (replacement), subsequent encounter: Secondary | ICD-10-CM

## 2015-05-07 DIAGNOSIS — IMO0001 Reserved for inherently not codable concepts without codable children: Secondary | ICD-10-CM

## 2015-05-07 DIAGNOSIS — T82330A Leakage of aortic (bifurcation) graft (replacement), initial encounter: Secondary | ICD-10-CM | POA: Insufficient documentation

## 2015-05-07 NOTE — Consult Note (Signed)
Chief Complaint: Patient was seen in consultation today for  Chief Complaint  Patient presents with  . Advice Only    Consult for Endoleak Repair   at the request of [place] [facility]  Referring Physician(s): Fields,Charles E  History of Present Illness: Daniel Blevins is a 55 y.o. male with a history of abdominal aortic aneurysm status post endovascular aortic repair with a Gore Excluder bifurcated endograft in December 2015.  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 Over the past year, his aneurysm has enlarged from 5.9 x 5.7 cm to 6.4 x 5.9 cm. Additionally, the proximal aspect of the graft is located slightly lower than expected with respect to the lowest renal artery resulting in a seal zone at the lower limits of normal. There is a possibility that there is a contributing type IA endoleak.  Daniel Blevins has remained clinically asymptomatic in regard to his abdominal aortic aneurysm. He denies abdominal pain, back pain, syncope or other systemic symptoms.  However, Daniel Blevins has been struggling with intermittent left lower extremity claudication for the past year since his EVAR procedure.  He describes his symptoms as a burning, cramping sensation which occurs deep in his left groin and radiates down along the medial aspect of his upper thigh. The symptoms occur only with exertion and fairly consistently at about 20 minutes of activity. He can no longer mow his grass and is unable to grocery shop in larger stores such as [employer] due to his symptoms. His symptoms are relieved if he is able to sit and rest. He is currently using the motorized scooters to shop in larger stores.    He denies skin changes, bruising or ulcerations involving the left lower extremity. No paresthesias, numbness or hematoma in the left groin.  Past Medical History  Diagnosis Date  . Hx of CABG   .  Hypertension   . 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 Coronary artery disease   . History of kidney stones   . Deafness in right ear     Past Surgical History  Procedure Laterality Date  . Laparoscopic gastric bypass  2010    [facility]  . Laparoscopy N/A 03/06/2013    Procedure: LAPAROSCOPY DIAGNOSTIC;  Surgeon: Adin Hector, MD;  Location: [facility];  Service: General;  Laterality: N/A;  . Laparoscopic lysis of adhesions N/A 03/06/2013    Procedure: LAPAROSCOPIC LYSIS OF ADHESIONS AND CLOSURE OF INTERNAL HERNIAS x2;  Surgeon: Adin Hector, MD;  Location: [facility];  Service: General;  Laterality: N/A;  . Coronary artery bypass graft  2001  . Cardiac catheterization    . Back surgery  2009    lower back  . Cystoscopy with retrograde pyelogram, ureteroscopy and stent placement Bilateral 02/07/2014    Procedure: CYSTOSCOPY WITH BILATERAL RETROGRADE PYELOGRAM, BILATERAL URETEROSCOPY, RIGHT EXTRACTION OF STONES AND RIGHT STENT PLACEMENT;  Surgeon: Jorja Loa, MD;  Location: [facility];  Service: Urology;  Laterality: Bilateral;  . Abdominal aortic endovascular stent graft N/A 04/30/2014    Procedure: ABDOMINAL AORTIC ENDOVASCULAR STENT GRAFT;  Surgeon: Elam Dutch, MD;  Location: [place];  Service: Vascular;  Laterality: N/A;  . Peripheral vascular catheterization Right 04/30/2014    Procedure: EMBOLIZATION right internal iliac;  Surgeon: Elam Dutch, MD;  Location: [place];  Service: Vascular;  Laterality: Right;    Allergies: Review of patient's allergies indicates no  known allergies.  Medications: Prior to Admission medications   Medication Sig Start Date End Date Taking? Authorizing Provider  amLODipine (NORVASC) 10 MG tablet Take 10 mg by mouth daily.    Historical Provider, MD  aspirin 81 MG tablet Take 81 mg by mouth every morning.     Historical Provider, MD  buPROPion (WELLBUTRIN XL) 150 MG 24 hr tablet Take 150 mg by mouth daily.  10/02/14   Historical Provider,  MD  carvedilol (COREG) 25 MG tablet Take 2 tablets (50 mg total) by mouth every morning. 12/16/14   Belva Crome, MD  cholecalciferol (VITAMIN D) 1000 UNITS tablet Take 2,000 Units by mouth daily.    Historical Provider, MD  Ferrous Sulfate (IRON) 325 (65 FE) MG TABS Take 1 tablet by mouth every morning.    Historical Provider, MD  KLOR-CON M20 20 MEQ tablet TAKE 2 TABLETS EVERY       MORNING 04/09/15   Belva Crome, MD  lisinopril (PRINIVIL,ZESTRIL) 40 MG tablet Take 1 tablet (40 mg total) by mouth every morning. 03/14/14   Belva Crome, MD  magnesium oxide (MAG-OX) 400 MG tablet Take 250 mg by mouth every morning.     Historical Provider, MD  Multiple Vitamin (MULTIVITAMIN WITH MINERALS) TABS tablet Take 1 tablet by mouth every morning.    Historical Provider, MD  simvastatin (ZOCOR) 20 MG tablet Take 1 tablet (20 mg total) by mouth every morning. 03/14/14   Belva Crome, MD  vitamin B-12 (CYANOCOBALAMIN) 1000 MCG tablet Take 1,000 mcg by mouth daily.    Historical Provider, MD     Family History  Problem Relation Age of Onset  . Lung cancer Mother   . Cancer Mother     Lung  . AAA (abdominal aortic aneurysm) Father   . Heart disease Father     before age 42  . Heart disease Brother     before age 29    Social History   Social History  . Marital Status: Married    Spouse Name: N/A  . Number of Children: N/A  . Years of Education: N/A   Social History Main Topics  . Smoking status: Never Smoker   . Smokeless tobacco: Never Used  . Alcohol Use: No  . Drug Use: No  . Sexual Activity: No   Other Topics Concern  . Not on file   Social History Narrative    Review of Systems: A 12 point ROS discussed and pertinent positives are indicated in the HPI above.  All other systems are negative.  Review of Systems  Vital Signs: BP 142/91 mmHg  Pulse 56  Temp(Src) 98.2 F (36.8 C) (Oral)  Resp 14  Ht 5\' 11"  (1.803 m)  Wt 250 lb (113.399 kg)  BMI 34.88 kg/m2  SpO2  100%  Physical Exam  Constitutional: He is oriented to person, place, and time. He appears well-developed and well-nourished. No distress.  HENT:  Head: Normocephalic and atraumatic.  Eyes: No scleral icterus.  Cardiovascular: Normal rate and regular rhythm.   Pulses:      Femoral pulses are 2+ on the right side, and 2+ on the left side. Pulmonary/Chest: Effort normal.  Abdominal: Soft. He exhibits no distension. There is no tenderness.  Neurological: He is alert and oriented to person, place, and time.  Skin: Skin is warm and dry.  Psychiatric: He has a normal mood and affect. His behavior is normal.  Nursing note and vitals reviewed.    Imaging:  Ct Angio Abd/pel W/ And/or W/o  04/10/2015  CLINICAL DATA:  55 year old male with a history of abdominal aortic aneurysm, status post endovascular repair with Gore Excluder, 04/30/2014. Main body delivered from the right, with embolization of the right hypogastric artery. There is a history of type 2 endoleak on interval CT angiogram, most recent 11/07/2014. EXAM: CTA ABDOMEN AND PELVIS WITH CONTRAST TECHNIQUE: Multidetector CT imaging of the abdomen and pelvis was performed using the standard protocol during bolus administration of intravenous contrast. Multiplanar reconstructed images and MIPs were obtained and reviewed to evaluate the vascular anatomy. CONTRAST:  75 cc Isovue 370 COMPARISON:  11/07/2014, 08/02/2014, 05/30/2014 Preoperative study 01-26-2014 FINDINGS: Nonvascular: Lower chest: Unremarkable appearance of the soft tissues of the chest wall. Surgical changes of prior median sternotomy. Heart size within normal limits.  No pericardial fluid/thickening. Calcifications in the distribution the right coronary artery and circumflex coronary artery. No lower mediastinal adenopathy. Unremarkable appearance of the distal esophagus. No evidence of hiatal hernia. No confluent airspace disease, pleural fluid, or pneumothorax within visualized lung.  Abdomen/pelvis: Surgical changes of prior gastric bypass. Unremarkable appearance of liver, spleen, bilateral adrenal glands. Unremarkable appearance of the pancreas. Unremarkable appearance of the gallbladder. Unremarkable appearance of the bilateral kidneys and course of the bilateral ureters. Unremarkable appearance of the urinary bladder. Unremarkable appearance of the small bowel and colon. Normal appendix. Note that the cecum has been immobilized and lies in the midline, above the level of the umbilicus with the appendix terminating to the left of midline. Vascular: Aorta: Visualized aspects of the distal thoracic aorta unremarkable. Diameter of the aorta at the aortic hiatus unremarkable. Again, patient is status post endovascular repair of infrarenal abdominal aortic fusiform aneurysm with Gore excluder device. Proximal cuff is unchanged in position on the coronal reformatted images, below the level of the lowest accessory renal arteries. No evidence of distal migration. The right limb terminates in the external iliac artery, status post embolization of the hypogastric artery. The left limb terminates just before the hypogastric origin, with patency of the hypogastric artery and left external iliac artery. There is a small crescent of contrast on the right lateral aspect of the proximal cuff of the endograft at the seal zone, although the contrast does not appear to be continuous with the contrast on the anterior aspect of the graft. (image 84) Evidence of persisting type 2 endoleak, with contrast opacification of the aneurysm sac associated with the origin of the inferior mesenteric artery and the L3 lumbar arteries. Greatest diameter of the aneurysm sac on the coronal reformatted images measured orthogonal to the flow lumen is 6.5 cm best estimate of the greatest diameter on the source axial images orthogonal to the flow channel measures 5.9 cm. (previous measurements on the coronal reformat approximately  5.9 cm and on the source axial images 5.7 cm). No evidence of periaortic fluid or hematoma. No inflammatory changes. Right lower extremity: No evidence of right-sided type 1 B endoleak. Right external iliac artery and proximal femoral arteries remain patent. There is reconstitution of the distal right hypogastric artery. Left lower extremity: No evidence of a left-sided type 1 B endoleak. The left external iliac artery and hypogastric artery remain patent. Aneurysm of the hypogastric artery is unchanged in diameter, measuring 2.2 cm. Ectasia of the gluteal vasculature is unchanged. Left external iliac artery and the proximal femoral arteries remain patent. Musculoskeletal: No displaced fracture. Multilevel degenerative changes of the spine. Vacuum disc phenomenon at the L4-L5 level. No significant bony canal narrowing.  Degenerative changes bilateral hips. Review of the MIP images confirms the above findings. IMPRESSION: Vascular: Patient is status post endovascular repair of fusiform infrarenal abdominal aortic aneurysm, with Gore Excluder endograft. There is a persisting type 2 endoleak, with contrast opacification of the aneurysm sac related to the origin of the inferior mesenteric artery and the L3 lumbar arteries, similar to the comparison. There is questionable slight interval enlargement of the aneurysm sac, with the greatest diameter (measured orthogonal to the flow channel) on source axial images measuring 5.9 cm compared to prior of 5.7 cm, and on the coronal reformatted images measuring as great as 6.5 cm, compared to prior measurement of 6.2 cm. Small crescent of contrast on the right lateral aspect of the proximal cuff of the endograft, inferior to the right renal artery at the seal zone. This appearance does not appear continuous with the contrast within the aneurysm sac, however, a type 1 endoleak cannot be excluded. Signed, Dulcy Fanny. Earleen Newport, DO [facility]  [facility] Electronically Signed   By: Corrie Mckusick D.O.   On: 04/10/2015 17:40    Labs:  CBC: No results for input(s): WBC, HGB, HCT, PLT in the last 8760 hours.  COAGS: No results for input(s): INR, APTT in the last 8760 hours.  BMP: No results for input(s): NA, K, CL, CO2, GLUCOSE, BUN, CALCIUM, CREATININE, GFRNONAA, GFRAA in the last 8760 hours.  Invalid input(s): CMP  LIVER FUNCTION TESTS: No results for input(s): BILITOT, AST, ALT, ALKPHOS, PROT, ALBUMIN in the last 8760 hours.  TUMOR MARKERS: No results for input(s): AFPTM, CEA, CA199, CHROMGRNA in the last 8760 hours.  Assessment and Plan:  Extremely pleasant 55 year old male with an enlarging abdominal aortic aneurysm status post endovascular aortic repair complicated by endoleak. Based on current imaging, the endoleak is most likely a type II endoleak with inflow via the inferior mesenteric artery and outflow via several lumbar arteries. However, there is some chance of an additional type IA endoleak as well.   Additionally, Daniel Blevins has been experiencing lifestyle limiting intermittent claudication of his left lower extremity beginning deep in the inguinal region and radiating down the medial aspect of his thigh for the past year since his EVAR procedure.  1.) Endoleak - Schedule for diagnostic angiography and probable endovascular endoleak repair at [facility]. We will begin with diagnostic angiography in an effort to exclude the possibility of a type IA endoleak. If there is no definitive type IA endoleak, we will proceed with attempted endovascular repair of the presumed type II endoleak from an SMA to IMA approach.   - If the above approach is unsuccessful, we will reschedule for a direct sac puncture approach under anesthesia.  2.) Claudication - After careful review of his CT images, there is some abnormal vasculature in the deep aspect of the left gluteal musculature which may represent an  arteriovenous malformation arising from the superior gluteal artery. 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 I would typically expect this to be in the gluteal distribution rather than the groin and proximal medial thigh, however that is possible. I seen no evidence of flow-limiting lesion in the profunda femoral artery or its proximal branches.  -We will begin an initial 3 month  trial of conservative therapy with cilostazol 100 mg twice a day and a 3 day per week graduated exercise regimen. If this is unsuccessful in alleviating his symptoms, I will discuss with him additional endovascular intervention to evaluate the possible AV malformation for embolization in an effort to decrease the steal phenomenon.  Thank you for this interesting consult.  I greatly enjoyed meeting Daniel Blevins and look forward to participating in their care.  A copy of this report was sent to the requesting provider on this date.  SignedLaurence Ferrari, [place] 05/07/2015, 9:22 AM   I spent a total of 40 Minutes in face to face in clinical consultation, greater than 50% of which was counseling/coordinating care for endoleak and left lower extremity claudication.

## 2015-05-09 ENCOUNTER — Telehealth: Payer: Self-pay | Admitting: Interventional Cardiology

## 2015-05-09 NOTE — Telephone Encounter (Signed)
New Message   Pt states that his radiologist Dr. Laurence Ferrari was adding a medication Cilostazol and mentioned that as long as he didn't have heart failure than the medication was ol to take. Pt request a call back to discuss if it is ok to take the medication.

## 2015-05-09 NOTE — Telephone Encounter (Signed)
Returned pt call. Adv pt that I have discussed the use of Cilostazol with Meaghn our Pharm-D. Pt last echo in 2014 his EF was 40-45%, even though pt does not have heart failure listed on his problem list. She feels we should defer to [place]. Adv pt that Dr.Smith is out of the office, I will fwd Dr.Smith the message and give the pt a f/u call with his response. Asked pt to hold off on starting the new medication. Pt agreeable with plan and verbalized understanding.

## 2015-05-12 NOTE — Telephone Encounter (Signed)
He should not take Pletal(Cilostazol)

## 2015-05-12 NOTE — Telephone Encounter (Signed)
Pt aware of Dr.Smith's response below. Pt sts that he will not take Pletal and he will f/u with his interventional radiologist for further recommendation. Pt voice appreciation for the call back and verbalized understanding.

## 2015-05-13 ENCOUNTER — Telehealth (HOSPITAL_COMMUNITY): Payer: Self-pay | Admitting: Interventional Radiology

## 2015-05-13 NOTE — Telephone Encounter (Signed)
Called pt to set up his endoleak procedure with Laurence Ferrari. Left VM for him to call me back to schedule. JM

## 2015-05-14 ENCOUNTER — Telehealth: Payer: Self-pay | Admitting: Radiology

## 2015-05-14 NOTE — Telephone Encounter (Signed)
Patient called to notify our office that Dr Pernell Dupre has advised that he NOT take Pletal.  Dr Laurence Ferrari notified.  Return call to patient to inform him that Dr Laurence Ferrari is aware of the above.    Rande Dario Riki Rusk, RN 05/14/2015 2:40 PM

## 2015-05-18 ENCOUNTER — Other Ambulatory Visit: Payer: Self-pay | Admitting: Interventional Cardiology

## 2015-05-19 ENCOUNTER — Other Ambulatory Visit: Payer: Self-pay | Admitting: Radiology

## 2015-05-20 ENCOUNTER — Encounter (HOSPITAL_COMMUNITY): Payer: Self-pay

## 2015-05-20 ENCOUNTER — Other Ambulatory Visit: Payer: Self-pay | Admitting: Interventional Radiology

## 2015-05-20 ENCOUNTER — Other Ambulatory Visit: Payer: Self-pay

## 2015-05-20 ENCOUNTER — Ambulatory Visit (HOSPITAL_COMMUNITY)
Admission: RE | Admit: 2015-05-20 | Discharge: 2015-05-20 | Disposition: A | Discharge status: Home / Self Care | Payer: Managed Care, Other (non HMO) | Source: Ambulatory Visit | Attending: Interventional Radiology | Admitting: Interventional Radiology

## 2015-05-20 DIAGNOSIS — I739 Peripheral vascular disease, unspecified: Secondary | ICD-10-CM | POA: Diagnosis not present

## 2015-05-20 DIAGNOSIS — I10 Essential (primary) hypertension: Secondary | ICD-10-CM | POA: Insufficient documentation

## 2015-05-20 DIAGNOSIS — Z87442 Personal history of urinary calculi: Secondary | ICD-10-CM | POA: Diagnosis not present

## 2015-05-20 DIAGNOSIS — IMO0001 Reserved for inherently not codable concepts without codable children: Secondary | ICD-10-CM

## 2015-05-20 DIAGNOSIS — I714 Abdominal aortic aneurysm, without rupture: Secondary | ICD-10-CM | POA: Insufficient documentation

## 2015-05-20 DIAGNOSIS — T82330A Leakage of aortic (bifurcation) graft (replacement), initial encounter: Secondary | ICD-10-CM | POA: Insufficient documentation

## 2015-05-20 DIAGNOSIS — I2510 Atherosclerotic heart disease of native coronary artery without angina pectoris: Secondary | ICD-10-CM | POA: Insufficient documentation

## 2015-05-20 DIAGNOSIS — H9191 Unspecified hearing loss, right ear: Secondary | ICD-10-CM | POA: Insufficient documentation

## 2015-05-20 DIAGNOSIS — Z8249 Family history of ischemic heart disease and other diseases of the circulatory system: Secondary | ICD-10-CM | POA: Diagnosis not present

## 2015-05-20 DIAGNOSIS — Z951 Presence of aortocoronary bypass graft: Secondary | ICD-10-CM | POA: Diagnosis not present

## 2015-05-20 DIAGNOSIS — Z7982 Long term (current) use of aspirin: Secondary | ICD-10-CM | POA: Insufficient documentation

## 2015-05-20 DIAGNOSIS — Z9884 Bariatric surgery status: Secondary | ICD-10-CM | POA: Diagnosis not present

## 2015-05-20 DIAGNOSIS — Y832 Surgical operation with anastomosis, bypass or graft as the cause of abnormal reaction of the patient, or of later complication, without mention of misadventure at the time of the procedure: Secondary | ICD-10-CM | POA: Insufficient documentation

## 2015-05-20 LAB — CBC
HCT: 35.3 % — ABNORMAL LOW (ref 39.0–52.0)
HEMOGLOBIN: 11.3 g/dL — AB (ref 13.0–17.0)
MCH: 24.9 pg — AB (ref 26.0–34.0)
MCHC: 32 g/dL (ref 30.0–36.0)
MCV: 77.9 fL — ABNORMAL LOW (ref 78.0–100.0)
Platelets: 125 10*3/uL — ABNORMAL LOW (ref 150–400)
RBC: 4.53 MIL/uL (ref 4.22–5.81)
RDW: 15 % (ref 11.5–15.5)
WBC: 3.8 10*3/uL — ABNORMAL LOW (ref 4.0–10.5)

## 2015-05-20 LAB — PROTIME-INR
INR: 1.19 (ref 0.00–1.49)
PROTHROMBIN TIME: 15.3 s — AB (ref 11.6–15.2)

## 2015-05-20 LAB — BASIC METABOLIC PANEL
ANION GAP: 9 (ref 5–15)
BUN: 14 mg/dL (ref 6–20)
CALCIUM: 8.3 mg/dL — AB (ref 8.9–10.3)
CO2: 28 mmol/L (ref 22–32)
CREATININE: 1.14 mg/dL (ref 0.61–1.24)
Chloride: 106 mmol/L (ref 101–111)
GFR calc Af Amer: >60 mL/min (ref >60)
GFR calc non Af Amer: >60 mL/min (ref >60)
GLUCOSE: 118 mg/dL — AB (ref 65–99)
Potassium: 3.4 mmol/L — ABNORMAL LOW (ref 3.5–5.1)
Sodium: 143 mmol/L (ref 135–145)

## 2015-05-20 LAB — APTT: APTT: 28 s (ref 24–37)

## 2015-05-20 MED ORDER — FENTANYL CITRATE (PF) 100 MCG/2ML IJ SOLN
INTRAMUSCULAR | Status: AC
  Filled 2015-05-20: qty 6

## 2015-05-20 MED ORDER — HYDROCODONE-ACETAMINOPHEN 5-325 MG PO TABS
1.0000 | ORAL_TABLET | ORAL | Status: DC | PRN
  Administered 2015-05-20: 1 via ORAL

## 2015-05-20 MED ORDER — HYDROCODONE-ACETAMINOPHEN 5-325 MG PO TABS
ORAL_TABLET | ORAL | Status: AC
  Administered 2015-05-20: 1 via ORAL
  Filled 2015-05-20: qty 1

## 2015-05-20 MED ORDER — LIDOCAINE HCL (PF) 1 % IJ SOLN: 20.0000 mL | Freq: Once | INTRAMUSCULAR | Status: DC

## 2015-05-20 MED ORDER — LIDOCAINE HCL 1 % IJ SOLN
INTRAMUSCULAR | Status: AC
  Administered 2015-05-20: 20 mL
  Filled 2015-05-20: qty 20

## 2015-05-20 MED ORDER — FENTANYL CITRATE (PF) 100 MCG/2ML IJ SOLN
INTRAMUSCULAR | Status: AC | PRN
  Administered 2015-05-20 (×2): 25 ug via INTRAVENOUS
  Administered 2015-05-20: 50 ug via INTRAVENOUS

## 2015-05-20 MED ORDER — MIDAZOLAM HCL 2 MG/2ML IJ SOLN
INTRAMUSCULAR | Status: AC | PRN
  Administered 2015-05-20 (×2): 0.5 mg via INTRAVENOUS
  Administered 2015-05-20: 1 mg via INTRAVENOUS

## 2015-05-20 MED ORDER — MIDAZOLAM HCL 2 MG/2ML IJ SOLN
INTRAMUSCULAR | Status: AC
  Filled 2015-05-20: qty 6

## 2015-05-20 MED ORDER — SODIUM CHLORIDE 0.9 % IV SOLN
Freq: Once | INTRAVENOUS | Status: AC
  Administered 2015-05-20: 1000 mL via INTRAVENOUS

## 2015-05-20 MED ORDER — IOHEXOL 300 MG/ML  SOLN
150.0000 mL | Freq: Once | INTRAMUSCULAR | Status: AC | PRN
  Administered 2015-05-20: 100 mL via INTRAVENOUS

## 2015-05-20 NOTE — Sedation Documentation (Signed)
Patient is resting comfortably. 

## 2015-05-20 NOTE — H&P (Signed)
Chief Complaint: Patient was seen in consultation today for abdominal aorta aneurysm endoleak embolization at the request of Dr [name]  Referring Physician(s): Dr [name]  History of Present Illness: Daniel Blevins is a 55 y.o. male   History of abdominal aortic aneurysm status post endovascular aortic repair with a Gore Excluder bifurcated endograft in December 2015. 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 Over the past year, his aneurysm has enlarged from 5.9 x 5.7 cm to 6.4 x 5.9 cm. Additionally, the proximal aspect of the graft is located slightly lower than expected with respect to the lowest renal artery resulting in a seal zone at the lower limits of normal. There is a possibility that there is a contributing type IA endoleak.  Daniel Blevins has been struggling with intermittent left lower extremity claudication for the past year since his EVAR procedure. He describes his symptoms as a burning, cramping sensation which occurs deep in his left groin and radiates down along the medial aspect of his upper thigh. The symptoms occur only with exertion and fairly consistently at about 20 minutes of activity  Pt was seen in consultation 05/07/2015 with Dr Laurence Ferrari Scheduled now for Aorta/mesenteric arteriogram with possible endovascular endoleak repair/embolization today.  Conservative therapy with medication was recommended at time of consultation; trial of 3 months suggested. Pt did not use Cilostazol 100 mg BID to help with claudication symptoms at the advice of his cardiologist. These symptoms are still bothersome to pt.   Past Medical History  Diagnosis Date  . Hx of CABG   . Hypertension   . 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 Coronary artery disease   . History of kidney stones   . Deafness in right ear     Past Surgical History  Procedure Laterality  Date  . Laparoscopic gastric bypass  2010    [facility]  . Laparoscopy N/A 03/06/2013    Procedure: LAPAROSCOPY DIAGNOSTIC;  Surgeon: Adin Hector, MD;  Location: [facility];  Service: General;  Laterality: N/A;  . Laparoscopic lysis of adhesions N/A 03/06/2013    Procedure: LAPAROSCOPIC LYSIS OF ADHESIONS AND CLOSURE OF INTERNAL HERNIAS x2;  Surgeon: Adin Hector, MD;  Location: [facility];  Service: General;  Laterality: N/A;  . Coronary artery bypass graft  2001  . Cardiac catheterization    . Back surgery  2009    lower back  . Cystoscopy with retrograde pyelogram, ureteroscopy and stent placement Bilateral 02/07/2014    Procedure: CYSTOSCOPY WITH BILATERAL RETROGRADE PYELOGRAM, BILATERAL URETEROSCOPY, RIGHT EXTRACTION OF STONES AND RIGHT STENT PLACEMENT;  Surgeon: Jorja Loa, MD;  Location: [facility];  Service: Urology;  Laterality: Bilateral;  . Abdominal aortic endovascular stent graft N/A 04/30/2014    Procedure: ABDOMINAL AORTIC ENDOVASCULAR STENT GRAFT;  Surgeon: Elam Dutch, MD;  Location: [facility];  Service: Vascular;  Laterality: N/A;  . Peripheral vascular catheterization Right 04/30/2014    Procedure: EMBOLIZATION right internal iliac;  Surgeon: Elam Dutch, MD;  Location: [facility];  Service: Vascular;  Laterality: Right;    Allergies: Review of patient's allergies indicates no known allergies.  Medications: Prior to Admission medications   Medication Sig Start Date End Date Taking? Authorizing Provider  amLODipine (NORVASC) 10 MG tablet Take 10 mg by mouth daily.   Yes Historical Provider, MD  aspirin 81 MG tablet Take 81 mg by mouth every  morning.    Yes Historical Provider, MD  buPROPion (WELLBUTRIN XL) 150 MG 24 hr tablet Take 150 mg by mouth daily.  10/02/14  Yes Historical Provider, MD  carvedilol (COREG) 25 MG tablet Take 2 tablets (50 mg total) by mouth every morning. 12/16/14  Yes Belva Crome, MD  cholecalciferol (VITAMIN D) 1000 UNITS tablet Take 2,000  Units by mouth daily.   Yes Historical Provider, MD  Ferrous Sulfate (IRON) 325 (65 FE) MG TABS Take 1 tablet by mouth every morning.   Yes Historical Provider, MD  KLOR-CON M20 20 MEQ tablet TAKE 2 TABLETS EVERY       MORNING 04/09/15  Yes Belva Crome, MD  magnesium oxide (MAG-OX) 400 MG tablet Take 250 mg by mouth every morning.    Yes Historical Provider, MD  Multiple Vitamin (MULTIVITAMIN WITH MINERALS) TABS tablet Take 1 tablet by mouth every morning.   Yes Historical Provider, MD  simvastatin (ZOCOR) 20 MG tablet Take 1 tablet (20 mg total) by mouth every morning. 03/14/14  Yes Belva Crome, MD  vitamin B-12 (CYANOCOBALAMIN) 1000 MCG tablet Take 1,000 mcg by mouth daily.   Yes Historical Provider, MD  lisinopril (PRINIVIL,ZESTRIL) 40 MG tablet TAKE 1 TABLET EVERY MORNING 05/19/15   Belva Crome, MD     Family History  Problem Relation Age of Onset  . Lung cancer Mother   . Cancer Mother     Lung  . AAA (abdominal aortic aneurysm) Father   . Heart disease Father     before age 38  . Heart disease Brother     before age 67    Social History   Social History  . Marital Status: Married    Spouse Name: N/A  . Number of Children: N/A  . Years of Education: N/A   Social History Main Topics  . Smoking status: Never Smoker   . Smokeless tobacco: Never Used  . Alcohol Use: No  . Drug Use: No  . Sexual Activity: No   Other Topics Concern  . None   Social History Narrative     Review of Systems: A 12 point ROS discussed and pertinent positives are indicated in the HPI above.  All other systems are negative.  Review of Systems  Constitutional: Positive for activity change and fatigue. Negative for fever and appetite change.  Respiratory: Negative for shortness of breath.   Cardiovascular: Negative for chest pain.  Gastrointestinal: Negative for nausea and diarrhea.  Genitourinary: Negative for difficulty urinating.  Musculoskeletal: Negative for back pain.    Neurological: Negative for weakness.  Psychiatric/Behavioral: Negative for behavioral problems and confusion.    Vital Signs: BP 131/97 mmHg  Pulse 54  Temp(Src) 97.7 F (36.5 C) (Oral)  Resp 18  Ht 5\' 11"  (1.803 m)  Wt 250 lb (113.399 kg)  BMI 34.88 kg/m2  SpO2 94%  Physical Exam  Constitutional: He is oriented to person, place, and time. He appears well-nourished.  Cardiovascular: Regular rhythm and normal heart sounds.   No murmur heard. Irreg rate  Pulmonary/Chest: Effort normal and breath sounds normal. He has no wheezes.  Abdominal: Soft. Bowel sounds are normal. There is no tenderness.  Musculoskeletal: Normal range of motion.  Neurological: He is alert and oriented to person, place, and time.  Skin: Skin is warm and dry.  Psychiatric: He has a normal mood and affect. His behavior is normal. Judgment and thought content normal.  Nursing note and vitals reviewed.   Mallampati Score:  MD Evaluation Airway: WNL Heart: WNL Abdomen: WNL Chest/ Lungs: WNL ASA  Classification: 2 Mallampati/Airway Score: One  Imaging: No results found.  Labs:  CBC:  Recent Labs  05/20/15 0640  WBC 3.8*  HGB 11.3*  HCT 35.3*  PLT 125*    COAGS:  Recent Labs  05/20/15 0640  INR 1.19  APTT 28    BMP: No results for input(s): NA, K, CL, CO2, GLUCOSE, BUN, CALCIUM, CREATININE, GFRNONAA, GFRAA in the last 8760 hours.  Invalid input(s): CMP  LIVER FUNCTION TESTS: No results for input(s): BILITOT, AST, ALT, ALKPHOS, PROT, ALBUMIN in the last 8760 hours.  TUMOR MARKERS: No results for input(s): AFPTM, CEA, CA199, CHROMGRNA in the last 8760 hours.  Assessment and Plan:  Abdominal aortic aneurysm status post endovascular aortic repair in December 2015 Endoleak Type 2 Enlarging over last yr Consulted with Dr Laurence Ferrari for endovascular repair Scheduled now for same Risks and Benefits discussed with the patient including, but not limited to bleeding, infection,  vascular injury or contrast induced renal failure. All of the patient's questions were answered, patient is agreeable to proceed. Consent signed and in chart.  Thank you for this interesting consult.  I greatly enjoyed meeting Daniel Blevins and look forward to participating in their care.  A copy of this report was sent to the requesting provider on this date.  Electronically Signed: Pranav Lince A 05/20/2015, 7:31 AM   I spent a total of  30 Minutes   in face to face in clinical consultation, greater than 50% of which was counseling/coordinating care for endoleak embolization

## 2015-05-20 NOTE — Discharge Instructions (Signed)

## 2015-05-20 NOTE — Sedation Documentation (Addendum)
Patient is resting, pt continues to attempt to reposition self for comfort with subtle movements. Additional medication given as discussed with Dr. Laurence Ferrari

## 2015-05-20 NOTE — H&P (Deleted)
  The note originally documented on this encounter has been moved the the encounter in which it belongs.  

## 2015-05-20 NOTE — Sedation Documentation (Signed)
Dressing clean, dry, and intact, no hematoma noted to Left groin

## 2015-05-20 NOTE — Sedation Documentation (Signed)
Patient denies pain and is resting comfortably.  

## 2015-05-20 NOTE — Procedures (Signed)
Interventional Radiology Procedure Note  Procedure: Extensive diagnostic arteriography selecting celiac, SMA, and left superior gluteal arteries.   Complications: None  Estimated Blood Loss: 0  Recommendations: - Bedrest x 4 hrs - DC home - D/W Dr. Oneida Alar, his office will contact pt to schedule for surgical repair  Signed,  Criselda Peaches, MD

## 2015-05-20 NOTE — Sedation Documentation (Signed)
Patient is resting, pt reports some discomfort. Additional medication given, confirmed with Dr. Laurence Ferrari

## 2015-05-20 NOTE — Sedation Documentation (Signed)
Pt transported to [facility] via [place]

## 2015-05-22 ENCOUNTER — Other Ambulatory Visit: Payer: Self-pay

## 2015-05-22 ENCOUNTER — Encounter (HOSPITAL_COMMUNITY): Payer: Self-pay

## 2015-05-22 ENCOUNTER — Encounter (HOSPITAL_COMMUNITY)
Admission: RE | Admit: 2015-05-22 | Discharge: 2015-05-22 | Disposition: A | Discharge status: Home / Self Care | Payer: Managed Care, Other (non HMO) | Source: Ambulatory Visit | Attending: Vascular Surgery | Admitting: Vascular Surgery

## 2015-05-22 DIAGNOSIS — Z01818 Encounter for other preprocedural examination: Secondary | ICD-10-CM | POA: Diagnosis present

## 2015-05-22 DIAGNOSIS — G4733 Obstructive sleep apnea (adult) (pediatric): Secondary | ICD-10-CM | POA: Diagnosis not present

## 2015-05-22 DIAGNOSIS — Z7982 Long term (current) use of aspirin: Secondary | ICD-10-CM | POA: Diagnosis not present

## 2015-05-22 DIAGNOSIS — Z01812 Encounter for preprocedural laboratory examination: Secondary | ICD-10-CM | POA: Diagnosis not present

## 2015-05-22 DIAGNOSIS — Z0183 Encounter for blood typing: Secondary | ICD-10-CM | POA: Insufficient documentation

## 2015-05-22 DIAGNOSIS — Y838 Other surgical procedures as the cause of abnormal reaction of the patient, or of later complication, without mention of misadventure at the time of the procedure: Secondary | ICD-10-CM | POA: Insufficient documentation

## 2015-05-22 DIAGNOSIS — T82898A Other specified complication of vascular prosthetic devices, implants and grafts, initial encounter: Secondary | ICD-10-CM | POA: Diagnosis not present

## 2015-05-22 DIAGNOSIS — Z79899 Other long term (current) drug therapy: Secondary | ICD-10-CM | POA: Diagnosis not present

## 2015-05-22 DIAGNOSIS — I2510 Atherosclerotic heart disease of native coronary artery without angina pectoris: Secondary | ICD-10-CM | POA: Diagnosis not present

## 2015-05-22 DIAGNOSIS — Z9884 Bariatric surgery status: Secondary | ICD-10-CM | POA: Insufficient documentation

## 2015-05-22 DIAGNOSIS — F329 Major depressive disorder, single episode, unspecified: Secondary | ICD-10-CM | POA: Diagnosis not present

## 2015-05-22 DIAGNOSIS — Z951 Presence of aortocoronary bypass graft: Secondary | ICD-10-CM | POA: Diagnosis not present

## 2015-05-22 HISTORY — DX: Sleep apnea, unspecified: G47.30

## 2015-05-22 HISTORY — DX: Frequency of micturition: R35.0

## 2015-05-22 HISTORY — DX: Major depressive disorder, single episode, unspecified: F32.9

## 2015-05-22 HISTORY — DX: Cardiac arrhythmia, unspecified: I49.9

## 2015-05-22 HISTORY — DX: Depression, unspecified: F32.A

## 2015-05-22 LAB — URINE MICROSCOPIC-ADD ON

## 2015-05-22 LAB — BLOOD GAS, ARTERIAL
Acid-Base Excess: 3.4 mmol/L — ABNORMAL HIGH (ref 0.0–2.0)
Bicarbonate: 27.3 mEq/L — ABNORMAL HIGH (ref 20.0–24.0)
DRAWN BY: 206361
FIO2: 0.21
O2 Saturation: 95.4 %
PATIENT TEMPERATURE: 98.6
PCO2 ART: 40.8 mmHg (ref 35.0–45.0)
PH ART: 7.44 (ref 7.350–7.450)
TCO2: 28.5 mmol/L (ref 0–100)
pO2, Arterial: 74.1 mmHg — ABNORMAL LOW (ref 80.0–100.0)

## 2015-05-22 LAB — SURGICAL PCR SCREEN
MRSA, PCR: NEGATIVE
STAPHYLOCOCCUS AUREUS: NEGATIVE

## 2015-05-22 LAB — PROTIME-INR
INR: 1.2 (ref 0.00–1.49)
Prothrombin Time: 15.3 seconds — ABNORMAL HIGH (ref 11.6–15.2)

## 2015-05-22 LAB — COMPREHENSIVE METABOLIC PANEL
ALBUMIN: 3.4 g/dL — AB (ref 3.5–5.0)
ALK PHOS: 59 U/L (ref 38–126)
ALT: 20 U/L (ref 17–63)
AST: 21 U/L (ref 15–41)
Anion gap: 9 (ref 5–15)
BILIRUBIN TOTAL: 0.4 mg/dL (ref 0.3–1.2)
BUN: 15 mg/dL (ref 6–20)
CALCIUM: 8.4 mg/dL — AB (ref 8.9–10.3)
CO2: 25 mmol/L (ref 22–32)
CREATININE: 1.31 mg/dL — AB (ref 0.61–1.24)
Chloride: 108 mmol/L (ref 101–111)
GFR calc Af Amer: >60 mL/min (ref >60)
GFR calc non Af Amer: >60 mL/min (ref >60)
GLUCOSE: 84 mg/dL (ref 65–99)
Potassium: 3.5 mmol/L (ref 3.5–5.1)
Sodium: 142 mmol/L (ref 135–145)
TOTAL PROTEIN: 6.9 g/dL (ref 6.5–8.1)

## 2015-05-22 LAB — CBC
HEMATOCRIT: 36 % — AB (ref 39.0–52.0)
HEMOGLOBIN: 11.5 g/dL — AB (ref 13.0–17.0)
MCH: 25 pg — ABNORMAL LOW (ref 26.0–34.0)
MCHC: 31.9 g/dL (ref 30.0–36.0)
MCV: 78.3 fL (ref 78.0–100.0)
Platelets: 123 10*3/uL — ABNORMAL LOW (ref 150–400)
RBC: 4.6 MIL/uL (ref 4.22–5.81)
RDW: 15 % (ref 11.5–15.5)
WBC: 4 10*3/uL (ref 4.0–10.5)

## 2015-05-22 LAB — URINALYSIS, ROUTINE W REFLEX MICROSCOPIC
BILIRUBIN URINE: NEGATIVE
Glucose, UA: NEGATIVE mg/dL
Ketones, ur: NEGATIVE mg/dL
Leukocytes, UA: NEGATIVE
NITRITE: NEGATIVE
Protein, ur: NEGATIVE mg/dL
SPECIFIC GRAVITY, URINE: 1.024 (ref 1.005–1.030)
pH: 5.5 (ref 5.0–8.0)

## 2015-05-22 LAB — TYPE AND SCREEN
ABO/RH(D): O POS
ANTIBODY SCREEN: NEGATIVE

## 2015-05-22 LAB — APTT: aPTT: 39 seconds — ABNORMAL HIGH (ref 24–37)

## 2015-05-22 NOTE — Pre-Procedure Instructions (Signed)
    Hussam Howden  05/22/2015      [id number] - [place], [address] AT [address] [place] [address] Phone: [phone] Fax: [place], [address] [address] Phone: [phone] Fax: [phone]    Your procedure is scheduled on Monday, January 23rd, 2017.  Report to [facility] Admitting at 5:30 A.M.  Call this number if you have problems the morning of surgery:  [phone]   Remember:  Do not eat food or drink liquids after midnight.   Take these medicines the morning of surgery with A SIP OF WATER: Amlodipine (Norvasc), Bupropion (Wellbutrin), Carvedilol (Coreg), Solifenacin (Vesicare).   Stop taking: Aspirin, NSAIDS, Aleve, Naproxen, Ibuprofen, Advil, Motrin, BC's, Goody's, Fish oil, all herbal medications, and all vitamins.      Do not wear jewelry.  Do not wear lotions, powders, or colognes.  You may NOT wear deodorant.  Men may shave face and neck.  Do not bring valuables to the hospital.  [facility] is not responsible for any belongings or valuables.  Contacts, dentures or bridgework may not be worn into surgery.  Leave your suitcase in the car.  After surgery it may be brought to your room.  For patients admitted to the hospital, discharge time will be determined by your treatment team.  Patients discharged the day of surgery will not be allowed to drive home.   Special instructions:  See attached.   Please read over the following fact sheets that you were given. Pain Booklet, Coughing and Deep Breathing, Blood Transfusion Information, MRSA Information and Surgical Site Infection Prevention

## 2015-05-22 NOTE — Progress Notes (Signed)
PCP - Dr. Lajean Manes at [facility] -Dr. Daneen Schick  EKG - 05/22/15 CXR - denies  Echo- 2014 - Epic Stress test - 2015 -Epic Cardiac Cath - pt. Believes it was in 2001  Patient denies chest pain and shortness of breath at PAT appointment.

## 2015-05-23 NOTE — Progress Notes (Signed)
Anesthesia Chart Review: Patient is a 54 year old male scheduled for repair of proximal cuff on endovascular stent graft on 05/26/15 by Dr. Oneida Alar. DxL Type II endoleak.  History includes AAA s/p EVAR 04/2014, non-smoker, CAD s/p CABG '01, gastric bypass '10, deaf in right ear, depression, OSA, nephrolithiasis. BMI is consistent with obesity.   PCP is Dr. Felipa Eth. Cardiologist is Dr. Daneen Schick, last visit 09/11/14. No new cardiaic testing ordered with 9 month follow-up recommended. Carotid U/S order which showed < 40% BICA stenosis. (Next visit scheduled for 07/17/15.)  Meds include amlodipine, ASA 81mg , Wellbutrin XL, Coreg, Iron, KCl, lisinopril, Mag-ox, Zocor, Vesicare.   05/22/15 EKG: SB at 59 bpm, PACs in a pattern of bigeminy, possible inferior infarct (age undetermined). Inferior infarct changes present on prior tracing from 03/14/14. He denied CP and SOB at PAT.  04/03/14 Nuclear stress test: Overall Impression: The study is abnormal. This is an intermediate risk scan because of left ventricular dysfunction. There is no scar or ischemia. However there is dilatation of the ventricle and there is left ventricular dysfunction. LV Ejection Fraction: 38%. LV Wall Motion: There is mild global hypokinesis. In addition the inferior wall and septum move less well than the other walls. (Report reviewed by Dr. Tamala Julian. He felt there was no significant difference than the more accurate LVEF by echo in 2014 with EF 40-45%. He cleared patient for AAA EVAR on 12/3/5 with moderate risk.)  12/14/13 Echo [facility]; Care Everywhere): SUMMARY There is mild concentric left ventricular hypertrophy. The left ventricle is mildly dilated. LV ejection fraction = 60-65%. No segmental wall motion abnormalities seen in the left ventricle The right ventricle is normal in size and function. The left atrium is mildly dilated. The right atrium is mildly dilated. There is no significant valvular stenosis or  regurgitation There is no pericardial effusion. There is no comparison study available.  03/07/13 Echo: Study Conclusions - Left ventricle: The cavity size was mildly dilated. Wall thickness was increased in a pattern of mild LVH. Systolic function was mildly to moderately reduced. The estimated ejection fraction was in the range of 40% to 45%. Diffuse hypokinesis. - Left atrium: The atrium was mildly to moderately dilated.  A copy of his 2001 PRE-CABG LHC is scanned under Results Review tab.  Preoperative labs noted. Cr 1.31. H/H 11.5/36.0. INr 1.20. PTT 39. T&S done. Glucose 84.   Reviewed above with anesthesiologist Dr. Ermalene Postin. Patient had stress test approximately 13 months ago and has since tolerated EVAR AAA. He saw Dr. Tamala Julian within the past 9 months and denied CV symptoms at PAT. If no acute changes then it is anticipated that he can proceed as planned.  George Hugh [facility]/Anesthesiology Phone [phone] 05/23/2015 12:54 PM

## 2015-05-25 MED ORDER — CEFUROXIME SODIUM 1.5 G IJ SOLR
1.5000 g | INTRAMUSCULAR | Status: AC
  Administered 2015-05-26: 1.5 g via INTRAVENOUS
  Filled 2015-05-25: qty 1.5

## 2015-05-25 NOTE — Anesthesia Preprocedure Evaluation (Addendum)
Anesthesia Evaluation  Patient identified by MRN, date of birth, ID band Patient awake    Reviewed: Allergy & Precautions, NPO status , Patient's Chart, lab work & pertinent test results  History of Anesthesia Complications Negative for: history of anesthetic complications  Airway Mallampati: I  TM Distance: >3 FB Neck ROM: Full    Dental  (+) Edentulous Upper, Edentulous Lower   Pulmonary sleep apnea (does not use CPAP) ,    breath sounds clear to auscultation       Cardiovascular hypertension, Pt. on medications (-) angina+ CAD, + CABG and + Peripheral Vascular Disease (endovasc stent for AAA)   Rhythm:Regular Rate:Normal  '15 Stress: no ischemia, EF 38%   Neuro/Psych negative neurological ROS     GI/Hepatic Neg liver ROS, H/o gastric bypass H/o SBO   Endo/Other  Morbid obesity  Renal/GU Renal InsufficiencyRenal disease (creat 1.31)     Musculoskeletal   Abdominal (+) + obese,   Peds  Hematology   Anesthesia Other Findings   Reproductive/Obstetrics                           Anesthesia Physical Anesthesia Plan  ASA: III  Anesthesia Plan: General   Post-op Pain Management:    Induction: Intravenous  Airway Management Planned: Oral ETT  Additional Equipment: Arterial line  Intra-op Plan:   Post-operative Plan: Extubation in OR  Informed Consent: I have reviewed the patients History and Physical, chart, labs and discussed the procedure including the risks, benefits and alternatives for the proposed anesthesia with the patient or authorized representative who has indicated his/her understanding and acceptance.     Plan Discussed with: CRNA and Surgeon  Anesthesia Plan Comments: (Plan routine monitors, GETA)        Anesthesia Quick Evaluation

## 2015-05-26 ENCOUNTER — Encounter (HOSPITAL_COMMUNITY)
Admission: RE | Disposition: A | Discharge status: Home / Self Care | Payer: Self-pay | Source: Ambulatory Visit | Attending: Vascular Surgery

## 2015-05-26 ENCOUNTER — Inpatient Hospital Stay (HOSPITAL_COMMUNITY): Payer: Managed Care, Other (non HMO)

## 2015-05-26 ENCOUNTER — Inpatient Hospital Stay (HOSPITAL_COMMUNITY)
Admission: RE | Admit: 2015-05-26 | Discharge: 2015-05-27 | DRG: 254 | Disposition: A | Discharge status: Home / Self Care | Payer: Managed Care, Other (non HMO) | Source: Ambulatory Visit | Attending: Vascular Surgery | Admitting: Vascular Surgery

## 2015-05-26 ENCOUNTER — Inpatient Hospital Stay (HOSPITAL_COMMUNITY): Payer: Managed Care, Other (non HMO) | Admitting: Anesthesiology

## 2015-05-26 ENCOUNTER — Encounter (HOSPITAL_COMMUNITY): Payer: Self-pay | Admitting: Certified Registered"

## 2015-05-26 ENCOUNTER — Other Ambulatory Visit: Payer: Self-pay | Admitting: *Deleted

## 2015-05-26 ENCOUNTER — Inpatient Hospital Stay (HOSPITAL_COMMUNITY): Payer: Managed Care, Other (non HMO) | Admitting: Vascular Surgery

## 2015-05-26 DIAGNOSIS — G4730 Sleep apnea, unspecified: Secondary | ICD-10-CM | POA: Diagnosis present

## 2015-05-26 DIAGNOSIS — T82330A Leakage of aortic (bifurcation) graft (replacement), initial encounter: Secondary | ICD-10-CM | POA: Diagnosis present

## 2015-05-26 DIAGNOSIS — Z7982 Long term (current) use of aspirin: Secondary | ICD-10-CM

## 2015-05-26 DIAGNOSIS — Z79899 Other long term (current) drug therapy: Secondary | ICD-10-CM | POA: Diagnosis not present

## 2015-05-26 DIAGNOSIS — Z9884 Bariatric surgery status: Secondary | ICD-10-CM

## 2015-05-26 DIAGNOSIS — E876 Hypokalemia: Secondary | ICD-10-CM | POA: Diagnosis present

## 2015-05-26 DIAGNOSIS — I714 Abdominal aortic aneurysm, without rupture, unspecified: Secondary | ICD-10-CM

## 2015-05-26 DIAGNOSIS — T82539A Leakage of unspecified cardiac and vascular devices and implants, initial encounter: Secondary | ICD-10-CM | POA: Diagnosis not present

## 2015-05-26 DIAGNOSIS — Z951 Presence of aortocoronary bypass graft: Secondary | ICD-10-CM

## 2015-05-26 DIAGNOSIS — I10 Essential (primary) hypertension: Secondary | ICD-10-CM | POA: Diagnosis present

## 2015-05-26 DIAGNOSIS — D696 Thrombocytopenia, unspecified: Secondary | ICD-10-CM | POA: Diagnosis present

## 2015-05-26 DIAGNOSIS — I2510 Atherosclerotic heart disease of native coronary artery without angina pectoris: Secondary | ICD-10-CM | POA: Diagnosis present

## 2015-05-26 DIAGNOSIS — Z95828 Presence of other vascular implants and grafts: Secondary | ICD-10-CM

## 2015-05-26 DIAGNOSIS — Z9889 Other specified postprocedural states: Secondary | ICD-10-CM

## 2015-05-26 DIAGNOSIS — I739 Peripheral vascular disease, unspecified: Secondary | ICD-10-CM | POA: Diagnosis present

## 2015-05-26 DIAGNOSIS — H9191 Unspecified hearing loss, right ear: Secondary | ICD-10-CM | POA: Diagnosis present

## 2015-05-26 DIAGNOSIS — Z8249 Family history of ischemic heart disease and other diseases of the circulatory system: Secondary | ICD-10-CM | POA: Diagnosis not present

## 2015-05-26 DIAGNOSIS — IMO0002 Reserved for concepts with insufficient information to code with codable children: Secondary | ICD-10-CM | POA: Diagnosis present

## 2015-05-26 DIAGNOSIS — Z8679 Personal history of other diseases of the circulatory system: Secondary | ICD-10-CM

## 2015-05-26 HISTORY — PX: ABDOMINAL AORTIC ENDOVASCULAR STENT GRAFT: SHX5707

## 2015-05-26 LAB — CBC
HEMATOCRIT: 31.9 % — AB (ref 39.0–52.0)
Hemoglobin: 10.4 g/dL — ABNORMAL LOW (ref 13.0–17.0)
MCH: 25.6 pg — ABNORMAL LOW (ref 26.0–34.0)
MCHC: 32.6 g/dL (ref 30.0–36.0)
MCV: 78.4 fL (ref 78.0–100.0)
PLATELETS: 107 10*3/uL — AB (ref 150–400)
RBC: 4.07 MIL/uL — AB (ref 4.22–5.81)
RDW: 15.1 % (ref 11.5–15.5)
WBC: 3.6 10*3/uL — AB (ref 4.0–10.5)

## 2015-05-26 LAB — BASIC METABOLIC PANEL
ANION GAP: 8 (ref 5–15)
BUN: 12 mg/dL (ref 6–20)
CALCIUM: 7.9 mg/dL — AB (ref 8.9–10.3)
CO2: 29 mmol/L (ref 22–32)
Chloride: 107 mmol/L (ref 101–111)
Creatinine, Ser: 1.27 mg/dL — ABNORMAL HIGH (ref 0.61–1.24)
Glucose, Bld: 94 mg/dL (ref 65–99)
Potassium: 3.4 mmol/L — ABNORMAL LOW (ref 3.5–5.1)
Sodium: 144 mmol/L (ref 135–145)

## 2015-05-26 LAB — PROTIME-INR
INR: 1.36 (ref 0.00–1.49)
Prothrombin Time: 16.9 seconds — ABNORMAL HIGH (ref 11.6–15.2)

## 2015-05-26 LAB — POCT ACTIVATED CLOTTING TIME: Activated Clotting Time: 147 seconds

## 2015-05-26 LAB — MAGNESIUM: Magnesium: 2 mg/dL (ref 1.7–2.4)

## 2015-05-26 LAB — APTT: APTT: 31 s (ref 24–37)

## 2015-05-26 SURGERY — INSERTION, ENDOVASCULAR STENT GRAFT, AORTA, ABDOMINAL
Anesthesia: General | Site: Abdomen

## 2015-05-26 MED ORDER — PHENOL 1.4 % MT LIQD: 1.0000 | OROMUCOSAL | Status: DC | PRN

## 2015-05-26 MED ORDER — SIMVASTATIN 20 MG PO TABS
20.0000 mg | ORAL_TABLET | ORAL | Status: DC
  Administered 2015-05-27: 20 mg via ORAL
  Filled 2015-05-26: qty 1

## 2015-05-26 MED ORDER — POTASSIUM CHLORIDE CRYS ER 20 MEQ PO TBCR
20.0000 meq | EXTENDED_RELEASE_TABLET | Freq: Two times a day (BID) | ORAL | Status: DC
  Administered 2015-05-26 – 2015-05-27 (×2): 20 meq via ORAL
  Filled 2015-05-26 (×2): qty 1

## 2015-05-26 MED ORDER — FENTANYL CITRATE (PF) 100 MCG/2ML IJ SOLN
25.0000 ug | INTRAMUSCULAR | Status: DC | PRN
  Administered 2015-05-26: 50 ug via INTRAVENOUS
  Administered 2015-05-26 (×4): 25 ug via INTRAVENOUS

## 2015-05-26 MED ORDER — AMLODIPINE BESYLATE 10 MG PO TABS
10.0000 mg | ORAL_TABLET | Freq: Every day | ORAL | Status: DC
  Administered 2015-05-27: 10 mg via ORAL
  Filled 2015-05-26: qty 1

## 2015-05-26 MED ORDER — LISINOPRIL 40 MG PO TABS
40.0000 mg | ORAL_TABLET | Freq: Every morning | ORAL | Status: DC
  Administered 2015-05-27: 40 mg via ORAL
  Filled 2015-05-26: qty 1

## 2015-05-26 MED ORDER — MIDAZOLAM HCL 5 MG/5ML IJ SOLN
INTRAMUSCULAR | Status: DC | PRN
  Administered 2015-05-26 (×2): 1 mg via INTRAVENOUS

## 2015-05-26 MED ORDER — IODIXANOL 320 MG/ML IV SOLN
INTRAVENOUS | Status: DC | PRN
  Administered 2015-05-26: 60.3 mL via INTRAVENOUS

## 2015-05-26 MED ORDER — DEXTROSE 5 % IV SOLN
1.5000 g | Freq: Two times a day (BID) | INTRAVENOUS | Status: AC
  Administered 2015-05-26 – 2015-05-27 (×2): 1.5 g via INTRAVENOUS
  Filled 2015-05-26 (×2): qty 1.5

## 2015-05-26 MED ORDER — GLYCOPYRROLATE 0.2 MG/ML IJ SOLN
INTRAMUSCULAR | Status: DC | PRN
  Administered 2015-05-26: 0.6 mg via INTRAVENOUS

## 2015-05-26 MED ORDER — 0.9 % SODIUM CHLORIDE (POUR BTL) OPTIME
TOPICAL | Status: DC | PRN
  Administered 2015-05-26: 1000 mL

## 2015-05-26 MED ORDER — MIDAZOLAM HCL 2 MG/2ML IJ SOLN
INTRAMUSCULAR | Status: AC
  Filled 2015-05-26: qty 2

## 2015-05-26 MED ORDER — FENTANYL CITRATE (PF) 250 MCG/5ML IJ SOLN
INTRAMUSCULAR | Status: AC
  Filled 2015-05-26: qty 5

## 2015-05-26 MED ORDER — ASPIRIN 81 MG PO CHEW
81.0000 mg | CHEWABLE_TABLET | ORAL | Status: DC
  Administered 2015-05-27: 81 mg via ORAL
  Filled 2015-05-26: qty 1

## 2015-05-26 MED ORDER — ONDANSETRON HCL 4 MG/2ML IJ SOLN: 4.0000 mg | Freq: Four times a day (QID) | INTRAMUSCULAR | Status: DC | PRN

## 2015-05-26 MED ORDER — PROTAMINE SULFATE 10 MG/ML IV SOLN
INTRAVENOUS | Status: AC
  Filled 2015-05-26: qty 5

## 2015-05-26 MED ORDER — OXYCODONE-ACETAMINOPHEN 5-325 MG PO TABS
ORAL_TABLET | ORAL | Status: AC
  Filled 2015-05-26: qty 2

## 2015-05-26 MED ORDER — VITAMIN B-12 1000 MCG PO TABS
1000.0000 ug | ORAL_TABLET | Freq: Every day | ORAL | Status: DC
  Administered 2015-05-27: 1000 ug via ORAL
  Filled 2015-05-26: qty 1

## 2015-05-26 MED ORDER — FENTANYL CITRATE (PF) 100 MCG/2ML IJ SOLN
INTRAMUSCULAR | Status: DC | PRN
  Administered 2015-05-26 (×3): 50 ug via INTRAVENOUS

## 2015-05-26 MED ORDER — ARTIFICIAL TEARS OP OINT
TOPICAL_OINTMENT | OPHTHALMIC | Status: AC
  Filled 2015-05-26: qty 3.5

## 2015-05-26 MED ORDER — METOPROLOL TARTRATE 1 MG/ML IV SOLN: 2.0000 mg | INTRAVENOUS | Status: DC | PRN

## 2015-05-26 MED ORDER — HEPARIN SODIUM (PORCINE) 1000 UNIT/ML IJ SOLN
INTRAMUSCULAR | Status: DC | PRN
  Administered 2015-05-26: 10000 [IU] via INTRAVENOUS

## 2015-05-26 MED ORDER — POTASSIUM CHLORIDE CRYS ER 20 MEQ PO TBCR
20.0000 meq | EXTENDED_RELEASE_TABLET | Freq: Every day | ORAL | Status: AC | PRN
  Administered 2015-05-27: 40 meq via ORAL
  Filled 2015-05-26: qty 2

## 2015-05-26 MED ORDER — PROMETHAZINE HCL 25 MG/ML IJ SOLN: 6.2500 mg | INTRAMUSCULAR | Status: DC | PRN

## 2015-05-26 MED ORDER — CHLORHEXIDINE GLUCONATE CLOTH 2 % EX PADS
6.0000 | MEDICATED_PAD | Freq: Once | CUTANEOUS | Status: DC
Start: 2015-05-26 — End: 2015-05-26

## 2015-05-26 MED ORDER — HEPARIN SODIUM (PORCINE) 1000 UNIT/ML IJ SOLN
INTRAMUSCULAR | Status: AC
  Filled 2015-05-26: qty 1

## 2015-05-26 MED ORDER — VITAMIN D 1000 UNITS PO TABS
2000.0000 [IU] | ORAL_TABLET | Freq: Every day | ORAL | Status: DC
  Administered 2015-05-27: 2000 [IU] via ORAL
  Filled 2015-05-26: qty 2

## 2015-05-26 MED ORDER — MEPERIDINE HCL 25 MG/ML IJ SOLN: 6.2500 mg | INTRAMUSCULAR | Status: DC | PRN

## 2015-05-26 MED ORDER — ALUM & MAG HYDROXIDE-SIMETH 200-200-20 MG/5ML PO SUSP: 15.0000 mL | ORAL | Status: DC | PRN

## 2015-05-26 MED ORDER — ARTIFICIAL TEARS OP OINT
TOPICAL_OINTMENT | OPHTHALMIC | Status: DC | PRN
  Administered 2015-05-26: 1 via OPHTHALMIC

## 2015-05-26 MED ORDER — PROPOFOL 10 MG/ML IV BOLUS
INTRAVENOUS | Status: DC | PRN
  Administered 2015-05-26: 150 mg via INTRAVENOUS

## 2015-05-26 MED ORDER — CHLORHEXIDINE GLUCONATE CLOTH 2 % EX PADS: 6.0000 | MEDICATED_PAD | Freq: Once | CUTANEOUS | Status: DC

## 2015-05-26 MED ORDER — BUPROPION HCL ER (XL) 150 MG PO TB24
150.0000 mg | ORAL_TABLET | Freq: Every day | ORAL | Status: DC
  Administered 2015-05-27: 150 mg via ORAL
  Filled 2015-05-26: qty 1

## 2015-05-26 MED ORDER — FENTANYL CITRATE (PF) 100 MCG/2ML IJ SOLN
INTRAMUSCULAR | Status: AC
  Filled 2015-05-26: qty 2

## 2015-05-26 MED ORDER — PROTAMINE SULFATE 10 MG/ML IV SOLN
INTRAVENOUS | Status: DC | PRN
  Administered 2015-05-26: 10 mg via INTRAVENOUS
  Administered 2015-05-26: 90 mg via INTRAVENOUS

## 2015-05-26 MED ORDER — SODIUM CHLORIDE 0.9 % IV SOLN: INTRAVENOUS | Status: DC

## 2015-05-26 MED ORDER — PROPOFOL 10 MG/ML IV BOLUS
INTRAVENOUS | Status: AC
  Filled 2015-05-26: qty 20

## 2015-05-26 MED ORDER — SODIUM CHLORIDE 0.9 % IV SOLN
INTRAVENOUS | Status: DC | PRN
  Administered 2015-05-26: 500 mL

## 2015-05-26 MED ORDER — CARVEDILOL 25 MG PO TABS
50.0000 mg | ORAL_TABLET | ORAL | Status: DC
  Administered 2015-05-27: 50 mg via ORAL
  Filled 2015-05-26: qty 2

## 2015-05-26 MED ORDER — ROCURONIUM BROMIDE 100 MG/10ML IV SOLN
INTRAVENOUS | Status: DC | PRN
  Administered 2015-05-26: 50 mg via INTRAVENOUS

## 2015-05-26 MED ORDER — EPHEDRINE SULFATE 50 MG/ML IJ SOLN
INTRAMUSCULAR | Status: AC
  Filled 2015-05-26: qty 1

## 2015-05-26 MED ORDER — LABETALOL HCL 5 MG/ML IV SOLN: 10.0000 mg | INTRAVENOUS | Status: DC | PRN

## 2015-05-26 MED ORDER — PANTOPRAZOLE SODIUM 40 MG PO TBEC
40.0000 mg | DELAYED_RELEASE_TABLET | Freq: Every day | ORAL | Status: DC
  Administered 2015-05-27: 40 mg via ORAL
  Filled 2015-05-26: qty 1

## 2015-05-26 MED ORDER — SODIUM CHLORIDE 0.9 % IV SOLN: 500.0000 mL | Freq: Once | INTRAVENOUS | Status: DC | PRN

## 2015-05-26 MED ORDER — GUAIFENESIN-DM 100-10 MG/5ML PO SYRP: 15.0000 mL | ORAL_SOLUTION | ORAL | Status: DC | PRN

## 2015-05-26 MED ORDER — FENTANYL CITRATE (PF) 100 MCG/2ML IJ SOLN
INTRAMUSCULAR | Status: AC
Start: 2015-05-26 — End: 2015-05-27
  Filled 2015-05-26: qty 2

## 2015-05-26 MED ORDER — NEOSTIGMINE METHYLSULFATE 10 MG/10ML IV SOLN
INTRAVENOUS | Status: DC | PRN
  Administered 2015-05-26: 4 mg via INTRAVENOUS

## 2015-05-26 MED ORDER — SODIUM CHLORIDE 0.45 % IV SOLN
INTRAVENOUS | Status: DC
  Administered 2015-05-26: 16:00:00 via INTRAVENOUS

## 2015-05-26 MED ORDER — NEOSTIGMINE METHYLSULFATE 10 MG/10ML IV SOLN
INTRAVENOUS | Status: AC
  Filled 2015-05-26: qty 1

## 2015-05-26 MED ORDER — DARIFENACIN HYDROBROMIDE ER 7.5 MG PO TB24
7.5000 mg | ORAL_TABLET | Freq: Every day | ORAL | Status: DC
  Filled 2015-05-26: qty 1

## 2015-05-26 MED ORDER — MIDAZOLAM HCL 2 MG/2ML IJ SOLN: 0.5000 mg | Freq: Once | INTRAMUSCULAR | Status: DC | PRN

## 2015-05-26 MED ORDER — GLYCOPYRROLATE 0.2 MG/ML IJ SOLN
INTRAMUSCULAR | Status: AC
  Filled 2015-05-26: qty 3

## 2015-05-26 MED ORDER — SODIUM CHLORIDE 0.9 % IJ SOLN
INTRAMUSCULAR | Status: AC
  Filled 2015-05-26: qty 10

## 2015-05-26 MED ORDER — DOCUSATE SODIUM 100 MG PO CAPS
100.0000 mg | ORAL_CAPSULE | Freq: Every day | ORAL | Status: DC
  Administered 2015-05-27: 100 mg via ORAL
  Filled 2015-05-26: qty 1

## 2015-05-26 MED ORDER — LACTATED RINGERS IV SOLN
INTRAVENOUS | Status: DC | PRN
  Administered 2015-05-26 (×2): via INTRAVENOUS

## 2015-05-26 MED ORDER — ACETAMINOPHEN 650 MG RE SUPP: 325.0000 mg | RECTAL | Status: DC | PRN

## 2015-05-26 MED ORDER — ROCURONIUM BROMIDE 50 MG/5ML IV SOLN
INTRAVENOUS | Status: AC
  Filled 2015-05-26: qty 1

## 2015-05-26 MED ORDER — LIDOCAINE HCL (CARDIAC) 20 MG/ML IV SOLN
INTRAVENOUS | Status: AC
  Filled 2015-05-26: qty 5

## 2015-05-26 MED ORDER — MAGNESIUM SULFATE 2 GM/50ML IV SOLN: 2.0000 g | Freq: Every day | INTRAVENOUS | Status: DC | PRN

## 2015-05-26 MED ORDER — MORPHINE SULFATE (PF) 2 MG/ML IV SOLN: 2.0000 mg | INTRAVENOUS | Status: DC | PRN

## 2015-05-26 MED ORDER — HYDRALAZINE HCL 20 MG/ML IJ SOLN: 5.0000 mg | INTRAMUSCULAR | Status: DC | PRN

## 2015-05-26 MED ORDER — OXYCODONE-ACETAMINOPHEN 5-325 MG PO TABS
1.0000 | ORAL_TABLET | ORAL | Status: DC | PRN
  Administered 2015-05-26 (×2): 2 via ORAL
  Filled 2015-05-26: qty 2

## 2015-05-26 MED ORDER — ACETAMINOPHEN 325 MG PO TABS: 325.0000 mg | ORAL_TABLET | ORAL | Status: DC | PRN

## 2015-05-26 MED ORDER — ENOXAPARIN SODIUM 40 MG/0.4ML ~~LOC~~ SOLN
40.0000 mg | SUBCUTANEOUS | Status: DC
  Administered 2015-05-27: 40 mg via SUBCUTANEOUS
  Filled 2015-05-26: qty 0.4

## 2015-05-26 MED ORDER — ADULT MULTIVITAMIN W/MINERALS CH
1.0000 | ORAL_TABLET | ORAL | Status: DC
  Administered 2015-05-27: 1 via ORAL
  Filled 2015-05-26: qty 1

## 2015-05-26 MED ORDER — EPHEDRINE SULFATE 50 MG/ML IJ SOLN
INTRAMUSCULAR | Status: DC | PRN
  Administered 2015-05-26 (×2): 5 mg via INTRAVENOUS

## 2015-05-26 SURGICAL SUPPLY — 50 items
CANISTER SUCTION 2500CC (MISCELLANEOUS) ×2 IMPLANT
CATH ACCU-VU SIZ PIG 5F 70CM (CATHETERS) ×2 IMPLANT
CATH ANGIO 5F BER2 65CM (CATHETERS) ×2 IMPLANT
CATH BEACON 5.038 65CM KMP-01 (CATHETERS) IMPLANT
CATH OMNI FLUSH .035X70CM (CATHETERS) IMPLANT
COVER PROBE W GEL 5X96 (DRAPES) ×2 IMPLANT
DEVICE CLOSURE PERCLS PRGLD 6F (VASCULAR PRODUCTS) ×2 IMPLANT
DRSG TEGADERM 2-3/8X2-3/4 SM (GAUZE/BANDAGES/DRESSINGS) ×4 IMPLANT
DRYSEAL FLEXSHEATH 18FR 33CM (SHEATH) ×1
ELECT REM PT RETURN 9FT ADLT (ELECTROSURGICAL) ×4
ELECTRODE REM PT RTRN 9FT ADLT (ELECTROSURGICAL) ×2 IMPLANT
EXCLUDER AORTIC EXTEND 32X4.5 (Vascular Products) ×2 IMPLANT
GAUZE SPONGE 2X2 8PLY STRL LF (GAUZE/BANDAGES/DRESSINGS) ×1 IMPLANT
GLOVE BIO SURGEON STRL SZ 6.5 (GLOVE) ×2 IMPLANT
GLOVE BIO SURGEON STRL SZ7.5 (GLOVE) ×2 IMPLANT
GLOVE BIOGEL PI IND STRL 6 (GLOVE) ×1 IMPLANT
GLOVE BIOGEL PI IND STRL 6.5 (GLOVE) ×2 IMPLANT
GLOVE BIOGEL PI INDICATOR 6 (GLOVE) ×1
GLOVE BIOGEL PI INDICATOR 6.5 (GLOVE) ×2
GLOVE SURG SS PI 7.0 STRL IVOR (GLOVE) ×2 IMPLANT
GOWN STRL REUS W/ TWL LRG LVL3 (GOWN DISPOSABLE) ×3 IMPLANT
GOWN STRL REUS W/TWL LRG LVL3 (GOWN DISPOSABLE) ×3
GRAFT BALLN CATH 65CM (STENTS) ×1 IMPLANT
KIT BASIN OR (CUSTOM PROCEDURE TRAY) ×2 IMPLANT
KIT ROOM TURNOVER OR (KITS) ×2 IMPLANT
LIQUID BAND (GAUZE/BANDAGES/DRESSINGS) ×2 IMPLANT
NEEDLE PERC 18GX7CM (NEEDLE) ×2 IMPLANT
NS IRRIG 1000ML POUR BTL (IV SOLUTION) ×2 IMPLANT
PACK ENDOVASCULAR (PACKS) ×2 IMPLANT
PAD ARMBOARD 7.5X6 YLW CONV (MISCELLANEOUS) ×4 IMPLANT
PERCLOSE PROGLIDE 6F (VASCULAR PRODUCTS) ×4
SHEATH AVANTI 11CM 5FR (MISCELLANEOUS) ×2 IMPLANT
SHEATH AVANTI 11CM 8FR (MISCELLANEOUS) ×2 IMPLANT
SHEATH BRITE TIP 8FR 23CM (MISCELLANEOUS) IMPLANT
SHEATH DRYSEAL FLEX 18FR 33CM (SHEATH) ×1 IMPLANT
SPONGE GAUZE 2X2 STER 10/PKG (GAUZE/BANDAGES/DRESSINGS) ×1
SPONGE SURGIFOAM ABS GEL 100 (HEMOSTASIS) IMPLANT
STAPLER VISISTAT 35W (STAPLE) IMPLANT
STENT GRAFT BALLN CATH 65CM (STENTS) ×1
STOPCOCK MORSE 400PSI 3WAY (MISCELLANEOUS) ×2 IMPLANT
SUT PROLENE 5 0 C 1 24 (SUTURE) IMPLANT
SUT VIC AB 3-0 SH 27 (SUTURE)
SUT VIC AB 3-0 SH 27X BRD (SUTURE) IMPLANT
SUT VICRYL 4-0 PS2 18IN ABS (SUTURE) ×4 IMPLANT
SYR 30ML LL (SYRINGE) ×2 IMPLANT
TAPE CLOTH SURG 4X10 WHT LF (GAUZE/BANDAGES/DRESSINGS) ×2 IMPLANT
TRAY FOLEY W/METER SILVER 16FR (SET/KITS/TRAYS/PACK) ×2 IMPLANT
TUBING HIGH PRESSURE 120CM (CONNECTOR) ×2 IMPLANT
WIRE AMPLATZ SS-J .035X180CM (WIRE) ×2 IMPLANT
WIRE BENTSON .035X145CM (WIRE) ×4 IMPLANT

## 2015-05-26 NOTE — Op Note (Signed)
Procedure: Endovascular repair of type I proximal endoleak 32 x 4 cm proximal cuff  Preoperative diagnosis: Type I proximal endoleak.  Postoperative diagnosis: Same  Anesthesia: Gen.  Assistant: Silva Bandy PA-C  Operative findings: #1 proximal cuff delivered via a right femoral system 32 x 4 cm #2 Proglide closure right groin  Operative details: After obtaining informed consent, the patient was taken to the operating room. The patient was placed in supine position on the operating room table. A Foley catheter was placed. After induction of general anesthesia and endotracheal intubation, both groins were prepped and draped in usual sterile fashion and the patient was prepped totally from the nipples to the knees. Next ultrasound was used to identify the right common femoral artery. A small stab incision was made in the skin with 11 blade. The tract was dilated with a hemostat. An introducer needle was then used to cannulate the right common femoral artery without difficulty using ultrasound guidance. An 56 Bentson wire was then advanced up into the abdominal aorta. A 9 French dilator was placed over the guidewire to dilate the tract in the right groin. I subsequently then deployed 2 Proglides one at the 10:00 and one at the 2:00 position. The Proglide was removed over the Bentson wire. A short 9 French sheath was placed over the guidewire. This was thoroughly flushed with heparinized saline. Next attention was turned to the left groin ultrasound was used to identify the left common femoral artery. An introducer needle was used to cannulate the left common femoral artery without difficulty. An 23 Bentson wire was then threaded up in the abdominal aorta under fluoroscopic guidance. A 5 French sheath was most over the guidewire the left common femoral artery. This was thoroughly flushed with heparinized saline. A 5 French pigtail was then placed over the guidewire into the abdominal aorta just above the  proximal midportion of the pre-existing Gore Excluder stent near the level of the renal arteries. The Zygo fluoroscopy system was brought into position and placed in a slightly craniocaudal position. A 5 [place] Berenstein 2 catheter was placed over the Bentson wire in the right groin and the wire removed and exchanged out for no 35 Amplatz wire. A 9 French sheath was then removed from the right groin. An 76 French dry seal sheath was placed over the Amplatz wire up into the abdominal aorta just into the main body portion of the pre-existing stent graft. The patient was given 10,000 units of intravenous heparin. A 32 x 4 cm proximal Gore Excluder cuff was then brought on the operative field and advanced to an area near the renal arteries. Contrast angiogram was performed to identify the proximal portion of the stent. The left and right renal arteries and an accessory right renal artery were also marked. The stent was then deployed at the level of the left renal artery. The pigtail catheter was pulled down over a guidewire so it would not be trapped outside the stent. and then re-advanced up in the lumen of the stent graft. There was some encroachment on the inferior right accessory renal artery. However there were still some flow into this. This was still present after ballooning with the Gore balloon to smooth out any edges. An additional contrast angiogram was performed after ballooning which showed the left and right main renal arteries were widely patent. The accessory right renal artery was partially covered by the stent but still flow within the lumen. There is no type I or type II endoleak.  Views were performed in AP and a 35 LAO projection to confirm this. At this point the pigtail catheter was removed over a guidewire. The guidewire was also removed from the left groin. The 5 [place] shunt was left in place to be removed at the end of the procedure after the ACT is less than 175. Attention was turned to the  right groin. The pre-existing Amplatz wire was exchanged over the Berenstein catheter for an 035 Bentson wire. The large sheath was then removed and each Proglide snugged into place. There was good hemostasis at this point. The guidewire was removed. The Proglide were again snugged down. The skin exit site was reapproximated with Vicryl stitch. The patient had good Doppler flow to both feet. The patient was given 100 mg of protamine. The patient tolerated procedure well and there were complications.  [name], MD [facility] Office: [phone] Pager: [phone]

## 2015-05-26 NOTE — Transfer of Care (Signed)
Immediate Anesthesia Transfer of Care Note  Patient: Daniel Blevins  Procedure(s) Performed: Procedure(s): ABDOMINAL AORTIC ENDOVASCULAR STENT GRAFT RE-INTERVENTION; REPAIR OF PROXIMAL CUFF (N/A)  Patient Location: PACU  Anesthesia Type:General  Level of Consciousness: awake, alert  and oriented  Airway & Oxygen Therapy: Patient Spontanous Breathing and Patient connected to nasal cannula oxygen  Post-op Assessment: Report given to RN, Post -op Vital signs reviewed and stable and Patient moving all extremities X 4  Post vital signs: Reviewed and stable  Last Vitals:  Filed Vitals:   05/26/15 0551  BP: 121/79  Pulse: 51  Temp: 36.6 C  Resp: 18    Complications: No apparent anesthesia complications

## 2015-05-26 NOTE — Progress Notes (Signed)
Pharmacy Consult: Antibiotics renal dose adjustment  21 YOM s/p abdominal aortic stent, getting zinacef for post-op prophylaxis. Pharmacy is consulted for antibiotics renal dose adjustment. Scr 1.27, est. crcl 80 ml/min. He received pre-op dose at 0800.  Plan: Zinacef 1.5 g IV Q 12 hrs x 2 doses as ordered, no adjustment needed. Pharmacy sign off.   Thanks.  Maryanna Shape, PharmD, BCPS  Clinical Pharmacist  Pager: [phone]

## 2015-05-26 NOTE — Anesthesia Procedure Notes (Signed)
Procedure Name: Intubation Date/Time: 05/26/2015 7:50 AM Performed by: Gaylene Brooks Pre-anesthesia Checklist: Patient identified, Emergency Drugs available, Suction available, Patient being monitored and Timeout performed Patient Re-evaluated:Patient Re-evaluated prior to inductionOxygen Delivery Method: Circle system utilized Preoxygenation: Pre-oxygenation with 100% oxygen Intubation Type: IV induction Ventilation: Mask ventilation without difficulty and Oral airway inserted - appropriate to patient size Laryngoscope Size: Sabra Heck and 2 Grade View: Grade I Tube type: Oral Tube size: 7.5 mm Number of attempts: 1 Airway Equipment and Method: Stylet Placement Confirmation: ETT inserted through vocal cords under direct vision,  positive ETCO2,  CO2 detector and breath sounds checked- equal and bilateral Secured at: 22 cm Tube secured with: Tape Dental Injury: Teeth and Oropharynx as per pre-operative assessment

## 2015-05-26 NOTE — Interval H&P Note (Signed)
History and Physical Interval Note:  05/26/2015 7:27 AM  Daniel Blevins  has presented today for surgery, with the diagnosis of Abdominal aortic aneurysm I71.4; Type II endoleak T82.539  The various methods of treatment have been discussed with the patient and family. After consideration of risks, benefits and other options for treatment, the patient has consented to  Procedure(s): ABDOMINAL AORTIC ENDOVASCULAR STENT GRAFT RE-INTERVENTION; REPAIR OF PROXIMAL CUFF (N/A) as a surgical intervention .  The patient's history has been reviewed, patient examined, no change in status, stable for surgery.  I have reviewed the patient's chart and labs.  Questions were answered to the patient's satisfaction.     [name]

## 2015-05-26 NOTE — H&P (View-Only) (Signed)
Chief Complaint: Patient was seen in consultation today for  Chief Complaint  Patient presents with  . Advice Only    Consult for Endoleak Repair   at the request of S[facility] E  Referring Physician(s): Fields,Charles E  History of Present Illness: Daniel Blevins is a 55 y.o. male with a history of abdominal aortic aneurysm status post endovascular aortic repair with a Gore Excluder bifurcated endograft in December 2015.  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 Over the past year, his aneurysm has enlarged from 5.9 x 5.7 cm to 6.4 x 5.9 cm. Additionally, the proximal aspect of the graft is located slightly lower than expected with respect to the lowest renal artery resulting in a seal zone at the lower limits of normal. There is a possibility that there is a contributing type IA endoleak.  Daniel Blevins has remained clinically asymptomatic in regard to his abdominal aortic aneurysm. He denies abdominal pain, back pain, syncope or other systemic symptoms.  However, Daniel Blevins has been struggling with intermittent left lower extremity claudication for the past year since his EVAR procedure.  He describes his symptoms as a burning, cramping sensation which occurs deep in his left groin and radiates down along the medial aspect of his upper thigh. The symptoms occur only with exertion and fairly consistently at about 20 minutes of activity. He can no longer mow his grass and is unable to grocery shop in larger stores such as [employer] due to his symptoms. His symptoms are relieved if he is able to sit and rest. He is currently using the motorized scooters to shop in larger stores.    He denies skin changes, bruising or ulcerations involving the left lower extremity. No paresthesias, numbness or hematoma in the left groin.  Past Medical History  Diagnosis Date  . Hx of CABG   .  Hypertension   . 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 Coronary artery disease   . History of kidney stones   . Deafness in right ear     Past Surgical History  Procedure Laterality Date  . Laparoscopic gastric bypass  2010    [facility]  . Laparoscopy N/A 03/06/2013    Procedure: LAPAROSCOPY DIAGNOSTIC;  Surgeon: Adin Hector, MD;  Location: [facility];  Service: General;  Laterality: N/A;  . Laparoscopic lysis of adhesions N/A 03/06/2013    Procedure: LAPAROSCOPIC LYSIS OF ADHESIONS AND CLOSURE OF INTERNAL HERNIAS x2;  Surgeon: Adin Hector, MD;  Location: [facility];  Service: General;  Laterality: N/A;  . Coronary artery bypass graft  2001  . Cardiac catheterization    . Back surgery  2009    lower back  . Cystoscopy with retrograde pyelogram, ureteroscopy and stent placement Bilateral 02/07/2014    Procedure: CYSTOSCOPY WITH BILATERAL RETROGRADE PYELOGRAM, BILATERAL URETEROSCOPY, RIGHT EXTRACTION OF STONES AND RIGHT STENT PLACEMENT;  Surgeon: Jorja Loa, MD;  Location: [facility];  Service: Urology;  Laterality: Bilateral;  . Abdominal aortic endovascular stent graft N/A 04/30/2014    Procedure: ABDOMINAL AORTIC ENDOVASCULAR STENT GRAFT;  Surgeon: Elam Dutch, MD;  Location: [place];  Service: Vascular;  Laterality: N/A;  . Peripheral vascular catheterization Right 04/30/2014    Procedure: EMBOLIZATION right internal iliac;  Surgeon: Elam Dutch, MD;  Location: [place];  Service: Vascular;  Laterality: Right;    Allergies: Review of patient's allergies indicates no  known allergies.  Medications: Prior to Admission medications   Medication Sig Start Date End Date Taking? Authorizing Provider  amLODipine (NORVASC) 10 MG tablet Take 10 mg by mouth daily.    Historical Provider, MD  aspirin 81 MG tablet Take 81 mg by mouth every morning.     Historical Provider, MD  buPROPion (WELLBUTRIN XL) 150 MG 24 hr tablet Take 150 mg by mouth daily.  10/02/14   Historical Provider,  MD  carvedilol (COREG) 25 MG tablet Take 2 tablets (50 mg total) by mouth every morning. 12/16/14   Belva Crome, MD  cholecalciferol (VITAMIN D) 1000 UNITS tablet Take 2,000 Units by mouth daily.    Historical Provider, MD  Ferrous Sulfate (IRON) 325 (65 FE) MG TABS Take 1 tablet by mouth every morning.    Historical Provider, MD  KLOR-CON M20 20 MEQ tablet TAKE 2 TABLETS EVERY       MORNING 04/09/15   Belva Crome, MD  lisinopril (PRINIVIL,ZESTRIL) 40 MG tablet Take 1 tablet (40 mg total) by mouth every morning. 03/14/14   Belva Crome, MD  magnesium oxide (MAG-OX) 400 MG tablet Take 250 mg by mouth every morning.     Historical Provider, MD  Multiple Vitamin (MULTIVITAMIN WITH MINERALS) TABS tablet Take 1 tablet by mouth every morning.    Historical Provider, MD  simvastatin (ZOCOR) 20 MG tablet Take 1 tablet (20 mg total) by mouth every morning. 03/14/14   Belva Crome, MD  vitamin B-12 (CYANOCOBALAMIN) 1000 MCG tablet Take 1,000 mcg by mouth daily.    Historical Provider, MD     Family History  Problem Relation Age of Onset  . Lung cancer Mother   . Cancer Mother     Lung  . AAA (abdominal aortic aneurysm) Father   . Heart disease Father     before age 55  . Heart disease Brother     before age 65    Social History   Social History  . Marital Status: Married    Spouse Name: N/A  . Number of Children: N/A  . Years of Education: N/A   Social History Main Topics  . Smoking status: Never Smoker   . Smokeless tobacco: Never Used  . Alcohol Use: No  . Drug Use: No  . Sexual Activity: No   Other Topics Concern  . Not on file   Social History Narrative    Review of Systems: A 12 point ROS discussed and pertinent positives are indicated in the HPI above.  All other systems are negative.  Review of Systems  Vital Signs: BP 142/91 mmHg  Pulse 56  Temp(Src) 98.2 F (36.8 C) (Oral)  Resp 14  Ht 5\' 11"  (1.803 m)  Wt 250 lb (113.399 kg)  BMI 34.88 kg/m2  SpO2  100%  Physical Exam  Constitutional: He is oriented to person, place, and time. He appears well-developed and well-nourished. No distress.  HENT:  Head: Normocephalic and atraumatic.  Eyes: No scleral icterus.  Cardiovascular: Normal rate and regular rhythm.   Pulses:      Femoral pulses are 2+ on the right side, and 2+ on the left side. Pulmonary/Chest: Effort normal.  Abdominal: Soft. He exhibits no distension. There is no tenderness.  Neurological: He is alert and oriented to person, place, and time.  Skin: Skin is warm and dry.  Psychiatric: He has a normal mood and affect. His behavior is normal.  Nursing note and vitals reviewed.    Imaging:  Ct Angio Abd/pel W/ And/or W/o  04/10/2015  CLINICAL DATA:  55 year old male with a history of abdominal aortic aneurysm, status post endovascular repair with Gore Excluder, 04/30/2014. Main body delivered from the right, with embolization of the right hypogastric artery. There is a history of type 2 endoleak on interval CT angiogram, most recent 11/07/2014. EXAM: CTA ABDOMEN AND PELVIS WITH CONTRAST TECHNIQUE: Multidetector CT imaging of the abdomen and pelvis was performed using the standard protocol during bolus administration of intravenous contrast. Multiplanar reconstructed images and MIPs were obtained and reviewed to evaluate the vascular anatomy. CONTRAST:  75 cc Isovue 370 COMPARISON:  11/07/2014, 08/02/2014, 05/30/2014 Preoperative study 2020-01-214 FINDINGS: Nonvascular: Lower chest: Unremarkable appearance of the soft tissues of the chest wall. Surgical changes of prior median sternotomy. Heart size within normal limits.  No pericardial fluid/thickening. Calcifications in the distribution the right coronary artery and circumflex coronary artery. No lower mediastinal adenopathy. Unremarkable appearance of the distal esophagus. No evidence of hiatal hernia. No confluent airspace disease, pleural fluid, or pneumothorax within visualized lung.  Abdomen/pelvis: Surgical changes of prior gastric bypass. Unremarkable appearance of liver, spleen, bilateral adrenal glands. Unremarkable appearance of the pancreas. Unremarkable appearance of the gallbladder. Unremarkable appearance of the bilateral kidneys and course of the bilateral ureters. Unremarkable appearance of the urinary bladder. Unremarkable appearance of the small bowel and colon. Normal appendix. Note that the cecum has been immobilized and lies in the midline, above the level of the umbilicus with the appendix terminating to the left of midline. Vascular: Aorta: Visualized aspects of the distal thoracic aorta unremarkable. Diameter of the aorta at the aortic hiatus unremarkable. Again, patient is status post endovascular repair of infrarenal abdominal aortic fusiform aneurysm with Gore excluder device. Proximal cuff is unchanged in position on the coronal reformatted images, below the level of the lowest accessory renal arteries. No evidence of distal migration. The right limb terminates in the external iliac artery, status post embolization of the hypogastric artery. The left limb terminates just before the hypogastric origin, with patency of the hypogastric artery and left external iliac artery. There is a small crescent of contrast on the right lateral aspect of the proximal cuff of the endograft at the seal zone, although the contrast does not appear to be continuous with the contrast on the anterior aspect of the graft. (image 84) Evidence of persisting type 2 endoleak, with contrast opacification of the aneurysm sac associated with the origin of the inferior mesenteric artery and the L3 lumbar arteries. Greatest diameter of the aneurysm sac on the coronal reformatted images measured orthogonal to the flow lumen is 6.5 cm best estimate of the greatest diameter on the source axial images orthogonal to the flow channel measures 5.9 cm. (previous measurements on the coronal reformat approximately  5.9 cm and on the source axial images 5.7 cm). No evidence of periaortic fluid or hematoma. No inflammatory changes. Right lower extremity: No evidence of right-sided type 1 B endoleak. Right external iliac artery and proximal femoral arteries remain patent. There is reconstitution of the distal right hypogastric artery. Left lower extremity: No evidence of a left-sided type 1 B endoleak. The left external iliac artery and hypogastric artery remain patent. Aneurysm of the hypogastric artery is unchanged in diameter, measuring 2.2 cm. Ectasia of the gluteal vasculature is unchanged. Left external iliac artery and the proximal femoral arteries remain patent. Musculoskeletal: No displaced fracture. Multilevel degenerative changes of the spine. Vacuum disc phenomenon at the L4-L5 level. No significant bony canal narrowing.  Degenerative changes bilateral hips. Review of the MIP images confirms the above findings. IMPRESSION: Vascular: Patient is status post endovascular repair of fusiform infrarenal abdominal aortic aneurysm, with Gore Excluder endograft. There is a persisting type 2 endoleak, with contrast opacification of the aneurysm sac related to the origin of the inferior mesenteric artery and the L3 lumbar arteries, similar to the comparison. There is questionable slight interval enlargement of the aneurysm sac, with the greatest diameter (measured orthogonal to the flow channel) on source axial images measuring 5.9 cm compared to prior of 5.7 cm, and on the coronal reformatted images measuring as great as 6.5 cm, compared to prior measurement of 6.2 cm. Small crescent of contrast on the right lateral aspect of the proximal cuff of the endograft, inferior to the right renal artery at the seal zone. This appearance does not appear continuous with the contrast within the aneurysm sac, however, a type 1 endoleak cannot be excluded. Signed, Dulcy Fanny. Earleen Newport, DO [facility]  [facility] Electronically Signed   By: Corrie Mckusick D.O.   On: 04/10/2015 17:40    Labs:  CBC: No results for input(s): WBC, HGB, HCT, PLT in the last 8760 hours.  COAGS: No results for input(s): INR, APTT in the last 8760 hours.  BMP: No results for input(s): NA, K, CL, CO2, GLUCOSE, BUN, CALCIUM, CREATININE, GFRNONAA, GFRAA in the last 8760 hours.  Invalid input(s): CMP  LIVER FUNCTION TESTS: No results for input(s): BILITOT, AST, ALT, ALKPHOS, PROT, ALBUMIN in the last 8760 hours.  TUMOR MARKERS: No results for input(s): AFPTM, CEA, CA199, CHROMGRNA in the last 8760 hours.  Assessment and Plan:  Extremely pleasant 55 year old male with an enlarging abdominal aortic aneurysm status post endovascular aortic repair complicated by endoleak. Based on current imaging, the endoleak is most likely a type II endoleak with inflow via the inferior mesenteric artery and outflow via several lumbar arteries. However, there is some chance of an additional type IA endoleak as well.   Additionally, Daniel Blevins has been experiencing lifestyle limiting intermittent claudication of his left lower extremity beginning deep in the inguinal region and radiating down the medial aspect of his thigh for the past year since his EVAR procedure.  1.) Endoleak - Schedule for diagnostic angiography and probable endovascular endoleak repair at [facility]. We will begin with diagnostic angiography in an effort to exclude the possibility of a type IA endoleak. If there is no definitive type IA endoleak, we will proceed with attempted endovascular repair of the presumed type II endoleak from an SMA to IMA approach.   - If the above approach is unsuccessful, we will reschedule for a direct sac puncture approach under anesthesia.  2.) Claudication - After careful review of his CT images, there is some abnormal vasculature in the deep aspect of the left gluteal musculature which may represent an  arteriovenous malformation arising from the superior gluteal artery. 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 I would typically expect this to be in the gluteal distribution rather than the groin and proximal medial thigh, however that is possible. I seen no evidence of flow-limiting lesion in the profunda femoral artery or its proximal branches.  -We will begin an initial 3 month  trial of conservative therapy with cilostazol 100 mg twice a day and a 3 day per week graduated exercise regimen. If this is unsuccessful in alleviating his symptoms, I will discuss with him additional endovascular intervention to evaluate the possible AV malformation for embolization in an effort to decrease the steal phenomenon.  Thank you for this interesting consult.  I greatly enjoyed meeting Daniel Blevins and look forward to participating in their care.  A copy of this report was sent to the requesting provider on this date.  SignedLaurence Ferrari, [place] 05/07/2015, 9:22 AM   I spent a total of 40 Minutes in face to face in clinical consultation, greater than 50% of which was counseling/coordinating care for endoleak and left lower extremity claudication.

## 2015-05-26 NOTE — Anesthesia Postprocedure Evaluation (Signed)
Anesthesia Post Note  Patient: Daniel Blevins  Procedure(s) Performed: Procedure(s) (LRB): ABDOMINAL AORTIC ENDOVASCULAR STENT GRAFT RE-INTERVENTION; REPAIR OF PROXIMAL CUFF (N/A)  Patient location during evaluation: PACU Anesthesia Type: General Level of consciousness: awake and alert, oriented and patient cooperative Pain management: pain level controlled Vital Signs Assessment: post-procedure vital signs reviewed and stable Respiratory status: spontaneous breathing, nonlabored ventilation, respiratory function stable and patient connected to nasal cannula oxygen Cardiovascular status: blood pressure returned to baseline and stable Postop Assessment: no signs of nausea or vomiting Anesthetic complications: no    Last Vitals:  Filed Vitals:   05/26/15 1130 05/26/15 1145  BP: 117/85   Pulse: 56 75  Temp:    Resp: 12 14    Last Pain:  Filed Vitals:   05/26/15 1147  PainSc: 6                  Charmane Protzman,E. Holger Sokolowski

## 2015-05-27 ENCOUNTER — Encounter (HOSPITAL_COMMUNITY): Payer: Self-pay | Admitting: Vascular Surgery

## 2015-05-27 LAB — CBC
HEMATOCRIT: 31.6 % — AB (ref 39.0–52.0)
HEMOGLOBIN: 10.1 g/dL — AB (ref 13.0–17.0)
MCH: 25 pg — ABNORMAL LOW (ref 26.0–34.0)
MCHC: 32 g/dL (ref 30.0–36.0)
MCV: 78.2 fL (ref 78.0–100.0)
Platelets: 114 10*3/uL — ABNORMAL LOW (ref 150–400)
RBC: 4.04 MIL/uL — ABNORMAL LOW (ref 4.22–5.81)
RDW: 15.2 % (ref 11.5–15.5)
WBC: 4.5 10*3/uL (ref 4.0–10.5)

## 2015-05-27 LAB — BASIC METABOLIC PANEL
ANION GAP: 9 (ref 5–15)
BUN: 13 mg/dL (ref 6–20)
CHLORIDE: 104 mmol/L (ref 101–111)
CO2: 28 mmol/L (ref 22–32)
CREATININE: 1.19 mg/dL (ref 0.61–1.24)
Calcium: 7.9 mg/dL — ABNORMAL LOW (ref 8.9–10.3)
GFR calc non Af Amer: >60 mL/min (ref >60)
Glucose, Bld: 162 mg/dL — ABNORMAL HIGH (ref 65–99)
POTASSIUM: 3.2 mmol/L — AB (ref 3.5–5.1)
Sodium: 141 mmol/L (ref 135–145)

## 2015-05-27 MED ORDER — OXYCODONE HCL 5 MG PO TABS: 5.0000 mg | ORAL_TABLET | Freq: Four times a day (QID) | ORAL | Status: DC | PRN

## 2015-05-27 NOTE — Progress Notes (Addendum)
  Progress Note    05/27/2015 7:14 AM 1 Day Post-Op  Subjective:  No complaints-ready to go home  Afebrile HR  40's-50's SB [id number] systolic 0000000 RA  Filed Vitals:   05/27/15 0254 05/27/15 0257  BP:  119/66  Pulse:  52  Temp: 97.8 F (36.6 C)   Resp:  17    Physical Exam: Cardiac:  Regular-brady Lungs:  Non labored Incisions:  Bilateral groins are soft without hematoma Extremities:  +palpable pedal DP pulses bilaterally Abdomen:  Soft, NT/ND  CBC    Component Value Date/Time   WBC 4.5 05/27/2015 0400   RBC 4.04* 05/27/2015 0400   HGB 10.1* 05/27/2015 0400   HCT 31.6* 05/27/2015 0400   PLT 114* 05/27/2015 0400   MCV 78.2 05/27/2015 0400   MCH 25.0* 05/27/2015 0400   MCHC 32.0 05/27/2015 0400   RDW 15.2 05/27/2015 0400   LYMPHSABS 0.5* 03/05/2013 1833   MONOABS 0.4 03/05/2013 1833   EOSABS 0.1 03/05/2013 1833   BASOSABS 0.0 03/05/2013 1833    BMET    Component Value Date/Time   NA 141 05/27/2015 0400   K 3.2* 05/27/2015 0400   CL 104 05/27/2015 0400   CO2 28 05/27/2015 0400   GLUCOSE 162* 05/27/2015 0400   BUN 13 05/27/2015 0400   CREATININE 1.19 05/27/2015 0400   CALCIUM 7.9* 05/27/2015 0400   GFRNONAA >60 05/27/2015 0400   GFRAA >60 05/27/2015 0400    INR    Component Value Date/Time   INR 1.36 05/26/2015 1000     Intake/Output Summary (Last 24 hours) at 05/27/15 0714 Last data filed at 05/27/15 0400  Gross per 24 hour  Intake   2265 ml  Output   2875 ml  Net   -610 ml     Assessment:  55 y.o. male is s/p:  Endovascular repair of type I proximal endoleak 32 x 4 cm proximal cuff  1 Day Post-Op  Plan: -pt doing well this morning with palpable bilateral DP pulses -pt has voided and ambulated -thrombocytopenia-stable and slightly improved from yesterday -hypokalemia this am at 3.2-supplemented with Kdur 72mEq -sinus brady-discussed with RN about documented HR of 28 & 38 (ECG HR 48-61)-says pt HR has been in high 40's-60's all  night.   Pt states his medication always makes his heart rate low and he says has been asymptomatic with this.  His admission HR was 51 -discharge home this morning and f/u with Dr. Oneida Alar in 4 weeks with CTA protocol.      Leontine Locket, PA-C [facility] [phone] 05/27/2015 7:14 AM  Groins look good D/c home today Follow up 1 month with CTA  [name], MD [facility] [place] Office: [phone] Pager: [phone]

## 2015-05-27 NOTE — Discharge Summary (Signed)
[facility] Discharge Summary   Daniel Blevins 07/26/1960 55 y.o. 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 male [place] Excluder 12/15. He had embolization of right internal iliac as well with extension to right external and left common. Preoperatively his abdominal aortic aneurysm 6 cm in diameter. Right common iliac was 3 cm left common iliac 2.2 cm. He has had persistent left groin pain which is fairly continuous in nature. This is also persisted since his operation. He has been somewhat limited in his activity due to pain from this. He returns today for further follow-up. He denies any abdominal or back pain.  Data: CT Angio the abdomen and pelvis is reviewed today. Abdominal aortic aneurysm 6.5 cm in diameter with persistent type II endoleak from multiple lumbar arteries and most likely the inferior mesenteric artery possible type I proximal endoleak but less likely.  Hospital Course:  The patient was admitted to the hospital and taken to the operating room on 05/26/2015 and underwent: Endovascular repair of type I proximal endoleak 32 x 4 cm proximal cuff    The pt tolerated the procedure well and was transported to the PACU in good condition.   On POD 1, the pt was doing well with bilateral soft groins and palpable bilateral DP pulses.  He was bradycardic, but does take Coreg 50mg  daily.  His admission HR was 51.  He has been asymptomatic from this.  He did have hypokalemia of 3.2 and this was supplemented.    The remainder of the hospital course consisted of increasing mobilization and increasing intake of solids without difficulty.  CBC    Component Value Date/Time   WBC 4.5 05/27/2015 0400   RBC 4.04* 05/27/2015 0400   HGB 10.1* 05/27/2015 0400   HCT 31.6* 05/27/2015 0400   PLT 114* 05/27/2015 0400   MCV 78.2 05/27/2015 0400   MCH 25.0* 05/27/2015 0400   MCHC 32.0 05/27/2015 0400   RDW 15.2 05/27/2015 0400   LYMPHSABS 0.5* 03/05/2013 1833   MONOABS 0.4 03/05/2013 1833   EOSABS 0.1 03/05/2013 1833   BASOSABS 0.0 03/05/2013 1833    BMET    Component Value Date/Time   NA 141 05/27/2015 0400   K 3.2* 05/27/2015 0400   CL 104 05/27/2015 0400   CO2 28 05/27/2015 0400   GLUCOSE 162* 05/27/2015 0400   BUN 13 05/27/2015 0400   CREATININE 1.19 05/27/2015 0400   CALCIUM 7.9* 05/27/2015 0400   GFRNONAA >60 05/27/2015 0400   GFRAA >60 05/27/2015 0400       Discharge Instructions    ABDOMINAL PROCEDURE/ANEURYSM REPAIR/AORTO-BIFEMORAL BYPASS:  Call MD for increased abdominal pain; cramping diarrhea; nausea/vomiting    Complete by:  As directed      Call MD for:  redness, tenderness, or signs of infection (pain, swelling, bleeding, redness, odor or green/yellow discharge around incision site)    Complete by:  As directed      Call MD for:  severe or increased pain, loss or decreased feeling  in affected limb(s)    Complete by:  As directed      Call MD for:  temperature >100.5    Complete by:  As directed      Discharge wound care:    Complete by:  As directed   Shower daily with soap and water starting 05/28/15  Driving Restrictions    Complete by:  As directed   No driving for 2 weeks     Lifting restrictions    Complete by:  As directed   No lifting for 4 weeks     Resume previous diet    Complete by:  As directed            Discharge Diagnosis:  Abdominal aortic aneurysm I71.4; 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 Endoleak of aortic graft ([place]) 05/26/2015  . Endoleak post ([facility]) endovascular aneurysm repair ([place])   . Left groin pain 11/07/2014  . Left thigh pain 09/11/2014  . CAD (coronary artery disease) of artery bypass graft 03/14/2014  . Kidney stones 03/14/2014  . Hyperlipidemia 03/14/2014    . Essential hypertension 03/14/2014  . SBO (small bowel obstruction) ([place]) 03/06/2013  . History of gastric bypass 03/06/2013  . AAA (abdominal aortic aneurysm) - 5cm infrarenal by CTscan [id number] 03/06/2013   Past Medical History  Diagnosis Date  . Hx of CABG   . Hypertension   . 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 Coronary artery disease   . History of kidney stones   . Deafness in right ear   . Dysrhythmia   . Sleep apnea   . Depression   . Urinary frequency        Medication List    TAKE these medications        amLODipine 10 MG tablet  Commonly known as:  NORVASC  Take 10 mg by mouth daily.     aspirin 81 MG tablet  Take 81 mg by mouth every morning.     buPROPion 150 MG 24 hr tablet  Commonly known as:  WELLBUTRIN XL  Take 150 mg by mouth daily.     carvedilol 25 MG tablet  Commonly known as:  COREG  Take 2 tablets (50 mg total) by mouth every morning.     cholecalciferol 1000 units tablet  Commonly known as:  VITAMIN D  Take 2,000 Units by mouth daily.     Iron 325 (65 Fe) MG Tabs  Take 1 tablet by mouth every morning.     KLOR-CON M20 20 MEQ tablet  Generic drug:  potassium chloride SA  TAKE 2 TABLETS EVERY       MORNING     lisinopril 40 MG tablet  Commonly known as:  PRINIVIL,ZESTRIL  TAKE 1 TABLET EVERY MORNING     magnesium oxide 400 MG tablet  Commonly known as:  MAG-OX  Take 250 mg by mouth every morning.     multivitamin with minerals Tabs tablet  Take 1 tablet by mouth every morning.     oxyCODONE 5 MG immediate release tablet  Commonly known as:  ROXICODONE  Take 1 tablet (5 mg total) by mouth every 6 (six) hours as needed.     simvastatin 20 MG tablet  Commonly known as:  ZOCOR  Take 1 tablet (20 mg total) by mouth every morning.     solifenacin 5 MG tablet  Commonly known as:  VESICARE  Take 5 mg by mouth daily.     vitamin B-12 1000 MCG tablet  Commonly known as:  CYANOCOBALAMIN  Take 1,000 mcg by mouth daily.         Prescriptions given: Roxicodone #20 No Refill  Instructions: 1.  Shower daily with soap and water starting 05/28/15  Disposition: home  Patient's condition: is Good  Follow up: 1. Dr. Oneida Alar in 4 weeks with CTA   Leontine Locket, PA-C [facility] [phone] 05/27/2015  7:30 AM   - For VQI Registry use --- Instructions: Press F2 to tab through selections.  Delete question if not applicable.   Post-op:  Time to Extubation: [x]  In OR, [ ]  < 12 hrs, [ ]  12-24 hrs, [ ]  >=24 hrs Vasopressors Req. Post-op: No MI: No., [ ]  Troponin only, [ ]  EKG or Clinical New Arrhythmia: No CHF: No ICU Stay: 1 day in stepdown Transfusion: No  If yes, n/a units given  Complications: Resp failure: No., [ ]  Pneumonia, [ ]  Ventilator Chg in renal function: No., [ ]  Inc. Cr > 0.5, [ ]  Temp. Dialysis, [ ]  Permanent dialysis Leg ischemia: No., no Surgery needed, [ ]  Yes, Surgery needed, [ ]  Amputation Bowel ischemia: No., [ ]  Medical Rx, [ ]  Surgical Rx Wound complication: No., [ ]  Superficial separation/infection, [ ]  Return to OR Return to OR: No  Return to OR for bleeding: No Stroke: No., [ ]  Minor, [ ]  Major  Discharge medications: Statin use:  Yes If No: [ ]  For Medical reasons, [ ]  Non-compliant, [ ]  Not-indicated ASA use:  Yes  If No: [ ]  For Medical reasons, [ ]  Non-compliant, [ ]  Not-indicated Plavix use:  No If No: [ ]  For Medical reasons, [ ]  Non-compliant, [ ]  Not-indicated Beta blocker use:  Yes If No: [ ]  For Medical reasons, [ ]  Non-compliant, [ ]  Not-indicated

## 2015-05-27 NOTE — Progress Notes (Signed)
Reviewed discharge orders including medications with pt and spouse. Pt discharged in wheelchair. Consuelo Pandy RN

## 2015-06-03 ENCOUNTER — Encounter: Payer: Self-pay | Admitting: *Deleted

## 2015-06-04 ENCOUNTER — Telehealth: Payer: Self-pay | Admitting: Vascular Surgery

## 2015-06-04 NOTE — Telephone Encounter (Signed)
-----   Message from Mena Goes, RN sent at 05/26/2015 10:46 AM EST ----- Regarding: schedule   ----- Message -----    From: Elam Dutch, MD    Sent: 05/26/2015   9:20 AM      To: [facility] Charge Pool  [phone]  Proximal cuff on Diamantina Monks asst  [place] both groins  He needs a CTA in 1 month and office visit at that time.  [name]

## 2015-06-04 NOTE — Telephone Encounter (Signed)
LM for patient regarding follow up appointments, dpm

## 2015-06-05 ENCOUNTER — Telehealth: Payer: Self-pay | Admitting: Hematology

## 2015-06-05 ENCOUNTER — Encounter: Payer: Self-pay | Admitting: Hematology

## 2015-06-05 ENCOUNTER — Ambulatory Visit (HOSPITAL_BASED_OUTPATIENT_CLINIC_OR_DEPARTMENT_OTHER): Payer: Managed Care, Other (non HMO) | Admitting: Hematology

## 2015-06-05 VITALS — BP 148/82 | HR 58 | Temp 98.4°F

## 2015-06-05 DIAGNOSIS — D6489 Other specified anemias: Secondary | ICD-10-CM | POA: Diagnosis not present

## 2015-06-05 DIAGNOSIS — D649 Anemia, unspecified: Secondary | ICD-10-CM | POA: Insufficient documentation

## 2015-06-05 DIAGNOSIS — D696 Thrombocytopenia, unspecified: Secondary | ICD-10-CM

## 2015-06-05 DIAGNOSIS — Z9884 Bariatric surgery status: Secondary | ICD-10-CM | POA: Diagnosis not present

## 2015-06-05 NOTE — Progress Notes (Signed)
[facility]    HEMATOLOGY/ONCOLOGY CONSULTATION NOTE  Date of Service: 06/05/2015  Patient Care Team: Lajean Manes, MD as PCP - General (Internal Medicine) Provider Not In [place], MD as Consulting Physician (Cardiology)  CHIEF COMPLAINTS/PURPOSE OF CONSULTATION:   Evaluation of persistent anemia and thrombocytopenia  HISTORY OF PRESENTING ILLNESS:  Daniel Blevins is a wonderful 55 y.o. male who has been referred to [place] by Dr .Mathews Argyle, MD for evaluation and management of Anemia.  Patient has a history of hypertension, dyslipidemia, nephrolithiasis, coronary artery disease status post 3 bare-metal stents in 2001,obesity (s/p gastric bypass surgery in 2010), sleep apnea (moderate in the past and uses CPAP, lost weight and currently only uses an oral appliance ), AAA 6 cm status post endovascular stent graft placement, chronic kidney disease stage III, history of gastric bypass surgery, history of B12 deficiency (on liquid B12 1-2 years).  He was referred for evaluation of persistent anemia with a hemoglobin varying from 10.2 to 12 since around 2014 and some persistent mild thrombocytopenia in 94-125k range.   Patient notes that he has been taking his vitamin B12 replacement regularly. It is on ferrous sulfate 1 tablet by mouth daily. Has not noted any overt hematochezia or melena. No epistaxis. Could previously had some hematuria related to nephrolithiasis requiring urologic intervention in October 2015. Patient had a 6 cm AAA and had a endovascular stent graft placement in December 2015 by Dr. Oneida Alar. He had subsequently developed a type I proximal endoleak and required endovascular repair of this which was done on 05/26/2015.  His labs today show improvement in his hemoglobin to 11.3, MCV is 78.3. Normal WBC count of 4.2k. Platelets have normalized from 114k to 179k. He has had no issues with overt bleeding at this time.  No other acute focal symptoms. Notes he had a  colonoscopy 5 years ago and no recent EGD required.     MEDICAL HISTORY:  Past Medical History  Diagnosis Date  . Hx of CABG   . Hypertension   . 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 Coronary artery disease   . History of kidney stones   . Deafness in right ear   . Dysrhythmia   . Sleep apnea   . Depression   . Urinary frequency     SURGICAL HISTORY: Past Surgical History  Procedure Laterality Date  . Laparoscopic gastric bypass  2010    [facility]  . Laparoscopy N/A 03/06/2013    Procedure: LAPAROSCOPY DIAGNOSTIC;  Surgeon: Adin Hector, MD;  Location: [facility];  Service: General;  Laterality: N/A;  . Laparoscopic lysis of adhesions N/A 03/06/2013    Procedure: LAPAROSCOPIC LYSIS OF ADHESIONS AND CLOSURE OF INTERNAL HERNIAS x2;  Surgeon: Adin Hector, MD;  Location: [facility];  Service: General;  Laterality: N/A;  . Coronary artery bypass graft  2001  . Cardiac catheterization    . Back surgery  2009    lower back  . Cystoscopy with retrograde pyelogram, ureteroscopy and stent placement Bilateral 02/07/2014    Procedure: CYSTOSCOPY WITH BILATERAL RETROGRADE PYELOGRAM, BILATERAL URETEROSCOPY, RIGHT EXTRACTION OF STONES AND RIGHT STENT PLACEMENT;  Surgeon: Jorja Loa, MD;  Location: [facility];  Service: Urology;  Laterality: Bilateral;  . Abdominal aortic endovascular stent graft N/A 04/30/2014    Procedure: ABDOMINAL AORTIC ENDOVASCULAR STENT GRAFT;  Surgeon: Elam Dutch, MD;  Location: [place];  Service: Vascular;  Laterality: N/A;  . Peripheral vascular catheterization Right 04/30/2014    Procedure: EMBOLIZATION  right internal iliac;  Surgeon: Elam Dutch, MD;  Location: [place];  Service: Vascular;  Laterality: Right;  . Colonoscopy    . Esophagogastroduodenoscopy    . Abdominal aortic endovascular stent graft N/A 05/26/2015    Procedure: ABDOMINAL AORTIC ENDOVASCULAR STENT GRAFT RE-INTERVENTION; REPAIR OF PROXIMAL CUFF;  Surgeon: Elam Dutch, MD;   Location: [place];  Service: Vascular;  Laterality: N/A;    SOCIAL HISTORY: Social History   Social History  . Marital Status: Married    Spouse Name: N/A  . Number of Children: N/A  . Years of Education: N/A   Occupational History  . Not on file.   Social History Main Topics  . Smoking status: Never Smoker   . Smokeless tobacco: Never Used  . Alcohol Use: No  . Drug Use: No  . Sexual Activity: No   Other Topics Concern  . Not on file   Social History Narrative    FAMILY HISTORY: Family History  Problem Relation Age of Onset  . Lung cancer Mother   . Cancer Mother     Lung  . AAA (abdominal aortic aneurysm) Father   . Heart disease Father     before age 49  . Heart disease Brother     before age 22    ALLERGIES:  has No Known Allergies.  MEDICATIONS:  Current Outpatient Prescriptions  Medication Sig Dispense Refill  . amLODipine (NORVASC) 10 MG tablet Take 10 mg by mouth daily.    Marland Kitchen aspirin 81 MG tablet Take 81 mg by mouth every morning.     Marland Kitchen buPROPion (WELLBUTRIN XL) 150 MG 24 hr tablet Take 150 mg by mouth daily.     . carvedilol (COREG) 25 MG tablet Take 2 tablets (50 mg total) by mouth every morning. 180 tablet 1  . cholecalciferol (VITAMIN D) 1000 UNITS tablet Take 2,000 Units by mouth daily.    . Ferrous Sulfate (IRON) 325 (65 FE) MG TABS Take 1 tablet by mouth every morning.    Marland Kitchen KLOR-CON M20 20 MEQ tablet TAKE 2 TABLETS EVERY       MORNING 180 tablet 1  . lisinopril (PRINIVIL,ZESTRIL) 40 MG tablet TAKE 1 TABLET EVERY MORNING 90 tablet 0  . magnesium oxide (MAG-OX) 400 MG tablet Take 250 mg by mouth every morning.     . Multiple Vitamin (MULTIVITAMIN WITH MINERALS) TABS tablet Take 1 tablet by mouth every morning.    . simvastatin (ZOCOR) 20 MG tablet Take 1 tablet (20 mg total) by mouth every morning. 90 tablet 3  . vitamin B-12 (CYANOCOBALAMIN) 1000 MCG tablet Take 1,000 mcg by mouth daily.     No current facility-administered medications for this  visit.    REVIEW OF SYSTEMS:    10 Point review of Systems was done is negative except as noted above.  PHYSICAL EXAMINATION: ECOG PERFORMANCE STATUS: 1 - Symptomatic but completely ambulatory  . Filed Vitals:   06/05/15 1508  BP: 148/82  Pulse: 58  Temp: 98.4 F (36.9 C)   There were no vitals filed for this visit. .There is no weight on file to calculate BMI.  GENERAL:alert, in no acute distress and comfortable SKIN: skin color, texture, turgor are normal, no rashes or significant lesions EYES: normal, conjunctiva are pink and non-injected, sclera clear OROPHARYNX:no exudate, no erythema and lips, buccal mucosa, and tongue normal  NECK: supple, no JVD, thyroid normal size, non-tender, without nodularity LYMPH:  no palpable lymphadenopathy in the cervical, axillary or inguinal LUNGS:  clear to auscultation with normal respiratory effort HEART: regular rate & rhythm,  no murmurs and no lower extremity edema ABDOMEN: abdomen soft, non-tender, normoactive bowel sounds  Musculoskeletal: no cyanosis of digits and no clubbing  PSYCH: alert & oriented x 3 with fluent speech NEURO: no focal motor/sensory deficits  LABORATORY DATA:  I have reviewed the data as listed  . CBC Latest Ref Rng 06/06/2015 05/27/2015 05/26/2015  WBC 4.0 - 10.3 10e3/uL 4.2 4.5 3.6(L)  Hemoglobin 13.0 - 17.1 g/dL 11.3(L) 10.1(L) 10.4(L)  Hematocrit 38.4 - 49.9 % 35.0(L) 31.6(L) 31.9(L)  Platelets 140 - 400 10e3/uL 179 114(L) 107(L)   . CBC    Component Value Date/Time   WBC 4.2 06/06/2015 1425   WBC 4.5 05/27/2015 0400   RBC 4.47 06/06/2015 1425   RBC 4.04* 05/27/2015 0400   HGB 11.3* 06/06/2015 1425   HGB 10.1* 05/27/2015 0400   HCT 35.0* 06/06/2015 1425   HCT 31.6* 05/27/2015 0400   PLT 179 06/06/2015 1425   PLT 114* 05/27/2015 0400   MCV 78.3* 06/06/2015 1425   MCV 78.2 05/27/2015 0400   MCH 25.3* 06/06/2015 1425   MCH 25.0* 05/27/2015 0400   MCHC 32.3 06/06/2015 1425   MCHC 32.0  05/27/2015 0400   RDW 15.0* 06/06/2015 1425   RDW 15.2 05/27/2015 0400   LYMPHSABS 1.6 06/06/2015 1425   LYMPHSABS 0.5* 03/05/2013 1833   MONOABS 0.4 06/06/2015 1425   MONOABS 0.4 03/05/2013 1833   EOSABS 0.2 06/06/2015 1425   EOSABS 0.1 03/05/2013 1833   BASOSABS 0.0 06/06/2015 1425   BASOSABS 0.0 03/05/2013 1833     .    CMP Latest Ref Rng 06/06/2015 06/06/2015 05/27/2015  Glucose 70 - 140 mg/dl 66(L) - 162(H)  BUN 7.0 - 26.0 mg/dL 14.6 - 13  Creatinine 0.7 - 1.3 mg/dL 1.3 - 1.19  Sodium 136 - 145 mEq/L 144 - 141  Potassium 3.5 - 5.1 mEq/L 3.7 - 3.2(L)  Chloride 101 - 111 mmol/L - - 104  CO2 22 - 29 mEq/L 26 - 28  Calcium 8.4 - 10.4 mg/dL 8.6 - 7.9(L)  Total Protein 6.0 - 8.5 g/dL 7.6 6.9 -  Total Bilirubin 0.20 - 1.20 mg/dL <0.30 - -  Alkaline Phos 40 - 150 U/L 70 - -  AST 5 - 34 U/L 17 - -  ALT 0 - 55 U/L 18 - -     ASSESSMENT & PLAN:   55 year old gentleman with  #1 Chronic microcytic anemia. Likely due to iron deficiency and possibly some element of anemia of chronic disease. Patient has a history of gastric bypass surgery 2010 which shall be an ongoing risk factor for his iron deficiency and B12 def Previously had worsening of anemia associated with surgical blood loss. He is currently on oral ferrous sulfate 1 tablet daily. And repair of an proximal endoleak of his AAA endovascular stent graft. Cannot rule out possibility of some element of hemolysis related to mechanical, from this leak. This should improve after the repair. Possible element of chronic kidney disease causing anemia of chronic disease.  #2 chronic mild thrombocytopenia. This seems to have resolved after fixing the endoleak of his AAA endovascular stent graft. Could have been related to bystander thrombocytopenia from increased shear stress of the endoleak causing hemolysis.  #3 history of B12 deficiency likely related to his gastric bypass surgery.  Plan -We'll check ferritin, iron profile,  B12. We'll check SPEP with quantitative immunoglobulins to rule out concerns of multiple myeloma. -Hemolytic markers  including LDH, haptoglobin. -Continue oral ferrous sulfate 1 tablet by mouth daily. Preferably with vitamin C or orange juice. Adjust oral dose to maintain ferritin level of more than 100. -Continue oral vitamin B12 replacement. Patient uses sublingually liquid formulation that might help absorption. -Monitor for overt blood loss. -No acute indications for transfusion at this time.  Return to care in 2 weeks to follow-up on lab results and decide on plan of care.  . Orders Placed This Encounter  Procedures  . CBC & Diff and Retic    Standing Status: Future     Number of Occurrences: 1     Standing Expiration Date: 07/09/2016  . Comprehensive metabolic panel    Standing Status: Future     Number of Occurrences: 1     Standing Expiration Date: 06/04/2016  . Smear    Standing Status: Future     Number of Occurrences: 1     Standing Expiration Date: 06/04/2016  . Ferritin    Standing Status: Future     Number of Occurrences: 1     Standing Expiration Date: 06/04/2016  . Sedimentation rate    Standing Status: Future     Number of Occurrences: 1     Standing Expiration Date: 06/04/2016  . Lactate dehydrogenase (LDH) - [facility]    Standing Status: Future     Number of Occurrences: 1     Standing Expiration Date: 06/04/2016  . Haptoglobin    Standing Status: Future     Number of Occurrences: 1     Standing Expiration Date: 06/04/2016  . Vitamin B12    Standing Status: Future     Number of Occurrences: 1     Standing Expiration Date: 06/04/2016  . Iron and TIBC    Standing Status: Future     Number of Occurrences: 1     Standing Expiration Date: 06/04/2016  . SPEP & IFE with QIG    Standing Status: Future     Number of Occurrences: 1     Standing Expiration Date: 07/09/2016     All of the patients questions were answered to his apparent satisfaction. The patient knows to call  the clinic with any problems, questions or concerns.  I spent 55 minutes counseling the patient face to face. The total time spent in the appointment was 60 minutes and more than 50% was on counseling and direct patient cares.    Sullivan Lone MD [place] AAHIVMS [facility] Hematology/Oncology Physician [facility]  (Office):       [phone] (Work cell):  [phone] (Fax):           [phone]

## 2015-06-05 NOTE — Telephone Encounter (Signed)
Appointments made and avs printed °

## 2015-06-06 ENCOUNTER — Other Ambulatory Visit (HOSPITAL_BASED_OUTPATIENT_CLINIC_OR_DEPARTMENT_OTHER): Payer: Managed Care, Other (non HMO)

## 2015-06-06 ENCOUNTER — Other Ambulatory Visit: Payer: Self-pay | Admitting: *Deleted

## 2015-06-06 DIAGNOSIS — D6489 Other specified anemias: Secondary | ICD-10-CM

## 2015-06-06 DIAGNOSIS — D696 Thrombocytopenia, unspecified: Secondary | ICD-10-CM

## 2015-06-06 LAB — CBC & DIFF AND RETIC
BASO%: 0.5 % (ref 0.0–2.0)
Basophils Absolute: 0 10*3/uL (ref 0.0–0.1)
EOS%: 5 % (ref 0.0–7.0)
Eosinophils Absolute: 0.2 10*3/uL (ref 0.0–0.5)
HCT: 35 % — ABNORMAL LOW (ref 38.4–49.9)
HGB: 11.3 g/dL — ABNORMAL LOW (ref 13.0–17.1)
Immature Retic Fract: 7.1 % (ref 3.00–10.60)
LYMPH%: 37.4 % (ref 14.0–49.0)
MCH: 25.3 pg — ABNORMAL LOW (ref 27.2–33.4)
MCHC: 32.3 g/dL (ref 32.0–36.0)
MCV: 78.3 fL — AB (ref 79.3–98.0)
MONO#: 0.4 10*3/uL (ref 0.1–0.9)
MONO%: 9.5 % (ref 0.0–14.0)
NEUT#: 2 10*3/uL (ref 1.5–6.5)
NEUT%: 47.6 % (ref 39.0–75.0)
PLATELETS: 179 10*3/uL (ref 140–400)
RBC: 4.47 10*6/uL (ref 4.20–5.82)
RDW: 15 % — ABNORMAL HIGH (ref 11.0–14.6)
Retic %: 0.91 % (ref 0.80–1.80)
Retic Ct Abs: 40.68 10*3/uL (ref 34.80–93.90)
WBC: 4.2 10*3/uL (ref 4.0–10.3)
lymph#: 1.6 10*3/uL (ref 0.9–3.3)

## 2015-06-06 LAB — COMPREHENSIVE METABOLIC PANEL
ALT: 18 U/L (ref 0–55)
ANION GAP: 12 meq/L — AB (ref 3–11)
AST: 17 U/L (ref 5–34)
Albumin: 3.4 g/dL — ABNORMAL LOW (ref 3.5–5.0)
Alkaline Phosphatase: 70 U/L (ref 40–150)
BUN: 14.6 mg/dL (ref 7.0–26.0)
CHLORIDE: 106 meq/L (ref 98–109)
CO2: 26 meq/L (ref 22–29)
CREATININE: 1.3 mg/dL (ref 0.7–1.3)
Calcium: 8.6 mg/dL (ref 8.4–10.4)
EGFR: 70 mL/min/{1.73_m2} — AB (ref >90)
Glucose: 66 mg/dl — ABNORMAL LOW (ref 70–140)
Potassium: 3.7 mEq/L (ref 3.5–5.1)
Sodium: 144 mEq/L (ref 136–145)
TOTAL PROTEIN: 7.6 g/dL (ref 6.4–8.3)
Total Bilirubin: <0.3 mg/dL (ref 0.20–1.20)

## 2015-06-06 LAB — IRON AND TIBC
%SAT: 22 % (ref 20–55)
Iron: 54 ug/dL (ref 42–163)
TIBC: 251 ug/dL (ref 202–409)
UIBC: 197 ug/dL (ref 117–376)

## 2015-06-06 LAB — CHCC SMEAR

## 2015-06-06 LAB — LACTATE DEHYDROGENASE: LDH: 186 U/L (ref 125–245)

## 2015-06-06 LAB — FERRITIN: Ferritin: 99 ng/ml (ref 22–316)

## 2015-06-07 LAB — HAPTOGLOBIN: HAPTOGLOBIN: 364 mg/dL — AB (ref 34–200)

## 2015-06-07 LAB — VITAMIN B12

## 2015-06-07 LAB — SEDIMENTATION RATE: SED RATE: 50 mm/h — AB (ref 0–30)

## 2015-06-09 LAB — MULTIPLE MYELOMA PANEL, SERUM
ALPHA2 GLOB SERPL ELPH-MCNC: 1 g/dL (ref 0.4–1.0)
Albumin SerPl Elph-Mcnc: 3.2 g/dL (ref 2.9–4.4)
Albumin/Glob SerPl: 0.9 (ref 0.7–1.7)
Alpha 1: 0.3 g/dL (ref 0.0–0.4)
B-Globulin SerPl Elph-Mcnc: 1.4 g/dL — ABNORMAL HIGH (ref 0.7–1.3)
Gamma Glob SerPl Elph-Mcnc: 1 g/dL (ref 0.4–1.8)
Globulin, Total: 3.7 g/dL (ref 2.2–3.9)
IGM (IMMUNOGLOBIN M), SRM: 26 mg/dL (ref 20–172)
IgA, Qn, Serum: 533 mg/dL — ABNORMAL HIGH (ref 90–386)
TOTAL PROTEIN: 6.9 g/dL (ref 6.0–8.5)

## 2015-06-19 ENCOUNTER — Encounter: Payer: Self-pay | Admitting: Hematology

## 2015-06-19 ENCOUNTER — Ambulatory Visit (HOSPITAL_BASED_OUTPATIENT_CLINIC_OR_DEPARTMENT_OTHER): Payer: Managed Care, Other (non HMO) | Admitting: Hematology

## 2015-06-19 VITALS — BP 135/79 | HR 69 | Temp 97.6°F | Resp 18 | Ht 71.0 in | Wt 265.2 lb

## 2015-06-19 DIAGNOSIS — D508 Other iron deficiency anemias: Secondary | ICD-10-CM

## 2015-06-19 DIAGNOSIS — N189 Chronic kidney disease, unspecified: Secondary | ICD-10-CM

## 2015-06-19 DIAGNOSIS — D696 Thrombocytopenia, unspecified: Secondary | ICD-10-CM | POA: Diagnosis not present

## 2015-06-19 DIAGNOSIS — Z9884 Bariatric surgery status: Secondary | ICD-10-CM

## 2015-06-20 ENCOUNTER — Other Ambulatory Visit: Payer: Self-pay | Admitting: Interventional Cardiology

## 2015-06-23 NOTE — Progress Notes (Signed)
Marland Kitchen    HEMATOLOGY/ONCOLOGY CONSULTATION NOTE  Date of Service: .06/19/2015  Patient Care Team: Lajean Manes, MD as PCP - General (Internal Medicine) Provider Not In Sevier, MD as Consulting Physician (Cardiology)  CHIEF COMPLAINTS/PURPOSE OF CONSULTATION:   Evaluation of persistent anemia and thrombocytopenia  HISTORY OF PRESENTING ILLNESS: see initial consultation for details on initial presentation  INTERVAL HISTORY  Mr Renfrew is here for follow-up to discuss his workup results. Notes no issues with overt bleeding. Energy levels have been good and he feels like he is close to his recent baseline. No other acute new concerns.  MEDICAL HISTORY:  Past Medical History  Diagnosis Date  . Hx of CABG   . Hypertension   . 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 Coronary artery disease   . History of kidney stones   . Deafness in right ear   . Dysrhythmia   . Sleep apnea   . Depression   . Urinary frequency     SURGICAL HISTORY: Past Surgical History  Procedure Laterality Date  . Laparoscopic gastric bypass  2010    [facility]  . Laparoscopy N/A 03/06/2013    Procedure: LAPAROSCOPY DIAGNOSTIC;  Surgeon: Adin Hector, MD;  Location: [facility];  Service: General;  Laterality: N/A;  . Laparoscopic lysis of adhesions N/A 03/06/2013    Procedure: LAPAROSCOPIC LYSIS OF ADHESIONS AND CLOSURE OF INTERNAL HERNIAS x2;  Surgeon: Adin Hector, MD;  Location: [facility];  Service: General;  Laterality: N/A;  . Coronary artery bypass graft  2001  . Cardiac catheterization    . Back surgery  2009    lower back  . Cystoscopy with retrograde pyelogram, ureteroscopy and stent placement Bilateral 02/07/2014    Procedure: CYSTOSCOPY WITH BILATERAL RETROGRADE PYELOGRAM, BILATERAL URETEROSCOPY, RIGHT EXTRACTION OF STONES AND RIGHT STENT PLACEMENT;  Surgeon: Jorja Loa, MD;  Location: [facility];  Service: Urology;  Laterality: Bilateral;  . Abdominal aortic endovascular stent  graft N/A 04/30/2014    Procedure: ABDOMINAL AORTIC ENDOVASCULAR STENT GRAFT;  Surgeon: Elam Dutch, MD;  Location: [place];  Service: Vascular;  Laterality: N/A;  . Peripheral vascular catheterization Right 04/30/2014    Procedure: EMBOLIZATION right internal iliac;  Surgeon: Elam Dutch, MD;  Location: [place];  Service: Vascular;  Laterality: Right;  . Colonoscopy    . Esophagogastroduodenoscopy    . Abdominal aortic endovascular stent graft N/A 05/26/2015    Procedure: ABDOMINAL AORTIC ENDOVASCULAR STENT GRAFT RE-INTERVENTION; REPAIR OF PROXIMAL CUFF;  Surgeon: Elam Dutch, MD;  Location: [place];  Service: Vascular;  Laterality: N/A;    SOCIAL HISTORY: Social History   Social History  . Marital Status: Married    Spouse Name: N/A  . Number of Children: N/A  . Years of Education: N/A   Occupational History  . Not on file.   Social History Main Topics  . Smoking status: Never Smoker   . Smokeless tobacco: Never Used  . Alcohol Use: No  . Drug Use: No  . Sexual Activity: No   Other Topics Concern  . Not on file   Social History Narrative    FAMILY HISTORY: Family History  Problem Relation Age of Onset  . Lung cancer Mother   . Cancer Mother     Lung  . AAA (abdominal aortic aneurysm) Father   . Heart disease Father     before age 33  . Heart disease Brother     before age 12  ALLERGIES:  has No Known Allergies.  MEDICATIONS:  Current Outpatient Prescriptions  Medication Sig Dispense Refill  . amLODipine (NORVASC) 10 MG tablet Take 10 mg by mouth daily.    Marland Kitchen aspirin 81 MG tablet Take 81 mg by mouth every morning.     Marland Kitchen buPROPion (WELLBUTRIN XL) 150 MG 24 hr tablet Take 150 mg by mouth daily.     . cholecalciferol (VITAMIN D) 1000 UNITS tablet Take 2,000 Units by mouth daily.    . Ferrous Sulfate (IRON) 325 (65 FE) MG TABS Take 1 tablet by mouth every morning.    Marland Kitchen KLOR-CON M20 20 MEQ tablet TAKE 2 TABLETS EVERY       MORNING 180 tablet 1  .  lisinopril (PRINIVIL,ZESTRIL) 40 MG tablet TAKE 1 TABLET EVERY MORNING 90 tablet 0  . magnesium oxide (MAG-OX) 400 MG tablet Take 250 mg by mouth every morning.     . Multiple Vitamin (MULTIVITAMIN WITH MINERALS) TABS tablet Take 1 tablet by mouth every morning.    . simvastatin (ZOCOR) 20 MG tablet Take 1 tablet (20 mg total) by mouth every morning. 90 tablet 3  . vitamin B-12 (CYANOCOBALAMIN) 1000 MCG tablet Take 1,000 mcg by mouth daily.    . carvedilol (COREG) 25 MG tablet Take 2 tablets (50 mg total) by mouth every morning. 180 tablet 0   No current facility-administered medications for this visit.    REVIEW OF SYSTEMS:    10 Point review of Systems was done is negative except as noted above.  PHYSICAL EXAMINATION: ECOG PERFORMANCE STATUS: 1 - Symptomatic but completely ambulatory  . Filed Vitals:   06/19/15 0943  BP: 135/79  Pulse: 69  Temp: 97.6 F (36.4 C)  Resp: 18   Filed Weights   06/19/15 0943  Weight: 265 lb 3.2 oz (120.294 kg)   .Body mass index is 37 kg/(m^2).  GENERAL:alert, in no acute distress and comfortable SKIN: skin color, texture, turgor are normal, no rashes or significant lesions EYES: normal, conjunctiva are pink and non-injected, sclera clear OROPHARYNX:no exudate, no erythema and lips, buccal mucosa, and tongue normal  NECK: supple, no JVD, thyroid normal size, non-tender, without nodularity LYMPH:  no palpable lymphadenopathy in the cervical, axillary or inguinal LUNGS: clear to auscultation with normal respiratory effort HEART: regular rate & rhythm,  no murmurs and no lower extremity edema ABDOMEN: abdomen soft, non-tender, normoactive bowel sounds  Musculoskeletal: no cyanosis of digits and no clubbing  PSYCH: alert & oriented x 3 with fluent speech NEURO: no focal motor/sensory deficits  LABORATORY DATA:  I have reviewed the data as listed  . CBC Latest Ref Rng 06/06/2015 05/27/2015 05/26/2015  WBC 4.0 - 10.3 10e3/uL 4.2 4.5 3.6(L)    Hemoglobin 13.0 - 17.1 g/dL 11.3(L) 10.1(L) 10.4(L)  Hematocrit 38.4 - 49.9 % 35.0(L) 31.6(L) 31.9(L)  Platelets 140 - 400 10e3/uL 179 114(L) 107(L)   . CBC    Component Value Date/Time   WBC 4.2 06/06/2015 1425   WBC 4.5 05/27/2015 0400   RBC 4.47 06/06/2015 1425   RBC 4.04* 05/27/2015 0400   HGB 11.3* 06/06/2015 1425   HGB 10.1* 05/27/2015 0400   HCT 35.0* 06/06/2015 1425   HCT 31.6* 05/27/2015 0400   PLT 179 06/06/2015 1425   PLT 114* 05/27/2015 0400   MCV 78.3* 06/06/2015 1425   MCV 78.2 05/27/2015 0400   MCH 25.3* 06/06/2015 1425   MCH 25.0* 05/27/2015 0400   MCHC 32.3 06/06/2015 1425   MCHC 32.0 05/27/2015 0400  RDW 15.0* 06/06/2015 1425   RDW 15.2 05/27/2015 0400   LYMPHSABS 1.6 06/06/2015 1425   LYMPHSABS 0.5* 03/05/2013 1833   MONOABS 0.4 06/06/2015 1425   MONOABS 0.4 03/05/2013 1833   EOSABS 0.2 06/06/2015 1425   EOSABS 0.1 03/05/2013 1833   BASOSABS 0.0 06/06/2015 1425   BASOSABS 0.0 03/05/2013 1833     CMP Latest Ref Rng 06/06/2015 06/06/2015 05/27/2015  Glucose 70 - 140 mg/dl 66(L) - 162(H)  BUN 7.0 - 26.0 mg/dL 14.6 - 13  Creatinine 0.7 - 1.3 mg/dL 1.3 - 1.19  Sodium 136 - 145 mEq/L 144 - 141  Potassium 3.5 - 5.1 mEq/L 3.7 - 3.2(L)  Chloride 101 - 111 mmol/L - - 104  CO2 22 - 29 mEq/L 26 - 28  Calcium 8.4 - 10.4 mg/dL 8.6 - 7.9(L)  Total Protein 6.0 - 8.5 g/dL 7.6 6.9 -  Total Bilirubin 0.20 - 1.20 mg/dL <0.30 - -  Alkaline Phos 40 - 150 U/L 70 - -  AST 5 - 34 U/L 17 - -  ALT 0 - 55 U/L 18 - -    Component     Latest Ref Rng 06/06/2015  IgG (Immunoglobin G), Serum     700 - 1600 mg/dL 1,157  IgA/Immunoglobulin A, Serum     90 - 386 mg/dL 533 (H)  IgM (Immunoglobin M), Srm     20 - 172 mg/dL 26  Total Protein     6.0 - 8.5 g/dL 6.9  Albumin SerPl Elph-Mcnc     2.9 - 4.4 g/dL 3.2  Alpha 1     0.0 - 0.4 g/dL 0.3  Alpha2 Glob SerPl Elph-Mcnc     0.4 - 1.0 g/dL 1.0  B-Globulin SerPl Elph-Mcnc     0.7 - 1.3 g/dL 1.4 (H)  Gamma Glob SerPl  Elph-Mcnc     0.4 - 1.8 g/dL 1.0  M Protein SerPl Elph-Mcnc     Not Observed g/dL Not Observed  Globulin, Total     2.2 - 3.9 g/dL 3.7  Albumin/Glob SerPl     0.7 - 1.7 0.9  IFE 1      Comment  Please Note (HCV):      Comment  Iron     42 - 163 ug/dL 54  TIBC     202 - 409 ug/dL 251  UIBC     117 - 376 ug/dL 197  %SAT     20 - 55 % 22  Ferritin     22 - 316 ng/ml 99  Sed Rate     0 - 30 mm/hr 50 (H)  LDH     125 - 245 U/L 186  Haptoglobin     34 - 200 mg/dL 364 (H)  Vitamin B12     211 - 946 pg/mL 1,085 (H)   ASSESSMENT & PLAN:   55 year old gentleman with  #1 Chronic microcytic anemia. Likely due to iron deficiency and possibly some element of anemia of chronic disease. Patient has a history of gastric bypass surgery 2010 which shall be an ongoing risk factor for his iron deficiency and B12 def Previously had worsening of anemia associated with surgical blood loss.  He had a recent 05/26/2015 -repair of an proximal endoleak of his AAA endovascular stent graft. Cannot rule out possibility of some element of hemolysis related to mechanical shearing forces from this leak. This has likely improved after the repair. Currently his LDH levels and haptoglobin levels are normal and there is no suggestion  of active hemolysis at this time.  Multiple myeloma panel showed no monoclonal protein.  His vitamin B12 levels are adequate with his current replacement.  Sedimentation rate somewhat elevated at 50. Possible element of chronic kidney disease causing anemia of chronic disease.   #2 Chronic mild thrombocytopenia. This seems to have resolved after fixing the endoleak of his AAA endovascular stent graft. Could have been related to bystander thrombocytopenia from increased shear stress of the endoleak causing hemolysis.  #3 history of B12 deficiency likely related to his gastric bypass surgery. Vitamin B12 levels adequate.   Plan -Patient's thrombocytopenia appears to have  resolved after correcting the endoleak of his AAA endovascular stent graft. No further intervention since his platelets have normalized. -Would continue ferrous sulfate 1 tablet by mouth daily with vitamin C or orange juice. Adjust oral and replacement to maintain ferritin levels of more than 100 and closely to 150. -If unable to maintain iron levels orally or if intolerant to iron would have low threshold to replace his iron IV. -Continue oral vitamin B12 replacement. Patient uses sublingually liquid formulation that might help absorption. -Monitor for overt blood loss. -All the lab results discussed in detail with the patient and all his questions were answered in details.  -Will discharge patient back to the care of his primary care physician.  Return to care as needed with Dr. Irene Limbo if any new questions or concerns arise or if that is needed for IV iron replacement.  All of the patients questions were answered to his apparent satisfaction. The patient knows to call the clinic with any problems, questions or concerns.  I spent 15 minutes counseling the patient face to face. The total time spent in the appointment was 20 minutes and more than 50% was on counseling and direct patient cares.    Sullivan Lone MD [place] AAHIVMS [facility] Hematology/Oncology Physician [facility]  (Office):       [phone] (Work cell):  [phone] (Fax):           [phone]

## 2015-06-26 ENCOUNTER — Ambulatory Visit
Admission: RE | Admit: 2015-06-26 | Discharge: 2015-06-26 | Disposition: A | Discharge status: Home / Self Care | Payer: Managed Care, Other (non HMO) | Source: Ambulatory Visit | Attending: Vascular Surgery | Admitting: Vascular Surgery

## 2015-06-26 ENCOUNTER — Encounter: Payer: Self-pay | Admitting: Vascular Surgery

## 2015-06-26 DIAGNOSIS — Z95828 Presence of other vascular implants and grafts: Secondary | ICD-10-CM

## 2015-06-26 DIAGNOSIS — I714 Abdominal aortic aneurysm, without rupture, unspecified: Secondary | ICD-10-CM

## 2015-06-26 MED ORDER — IOPAMIDOL (ISOVUE-370) INJECTION 76%
80.0000 mL | Freq: Once | INTRAVENOUS | Status: AC | PRN
  Administered 2015-06-26: 80 mL via INTRAVENOUS

## 2015-07-03 ENCOUNTER — Other Ambulatory Visit: Payer: Self-pay | Admitting: Vascular Surgery

## 2015-07-03 ENCOUNTER — Ambulatory Visit (INDEPENDENT_AMBULATORY_CARE_PROVIDER_SITE_OTHER): Payer: Self-pay | Admitting: Vascular Surgery

## 2015-07-03 ENCOUNTER — Encounter: Payer: Self-pay | Admitting: Vascular Surgery

## 2015-07-03 VITALS — BP 130/84 | HR 60 | Temp 98.0°F | Resp 16 | Ht 71.0 in | Wt 269.0 lb

## 2015-07-03 DIAGNOSIS — T82330D Leakage of aortic (bifurcation) graft (replacement), subsequent encounter: Secondary | ICD-10-CM

## 2015-07-03 DIAGNOSIS — I714 Abdominal aortic aneurysm, without rupture, unspecified: Secondary | ICD-10-CM

## 2015-07-03 DIAGNOSIS — IMO0001 Reserved for inherently not codable concepts without codable children: Secondary | ICD-10-CM

## 2015-07-03 NOTE — Progress Notes (Signed)
POST OPERATIVE OFFICE NOTE    CC:  F/u for surgery  HPI:  This is a 55 y.o. male who is s/p Endovascular repair of type I proximal endoleak 32 x 4 cm proximal cuff.  Gore Excluder 12/15. He had embolization of right internal iliac as well with extension to right external and left common. Preoperatively his abdominal aortic aneurysm 6 cm in diameter. Right common iliac was 3 cm left common iliac 2.2 cm.  He is here today for f/u CTA.    No Known Allergies  Current Outpatient Prescriptions  Medication Sig Dispense Refill  . amLODipine (NORVASC) 10 MG tablet Take 10 mg by mouth daily.    Marland Kitchen aspirin 81 MG tablet Take 81 mg by mouth every morning.     Marland Kitchen buPROPion (WELLBUTRIN XL) 150 MG 24 hr tablet Take 150 mg by mouth daily.     . carvedilol (COREG) 25 MG tablet Take 2 tablets (50 mg total) by mouth every morning. 180 tablet 0  . cholecalciferol (VITAMIN D) 1000 UNITS tablet Take 2,000 Units by mouth daily.    . Ferrous Sulfate (IRON) 325 (65 FE) MG TABS Take 1 tablet by mouth every morning.    Marland Kitchen KLOR-CON M20 20 MEQ tablet TAKE 2 TABLETS EVERY       MORNING 180 tablet 1  . lisinopril (PRINIVIL,ZESTRIL) 40 MG tablet TAKE 1 TABLET EVERY MORNING 90 tablet 0  . magnesium oxide (MAG-OX) 400 MG tablet Take 250 mg by mouth every morning.     . Multiple Vitamin (MULTIVITAMIN WITH MINERALS) TABS tablet Take 1 tablet by mouth every morning.    . simvastatin (ZOCOR) 20 MG tablet Take 1 tablet (20 mg total) by mouth every morning. 90 tablet 3  . vitamin B-12 (CYANOCOBALAMIN) 1000 MCG tablet Take 1,000 mcg by mouth daily.     No current facility-administered medications for this visit.     ROS:  See HPI  Physical Exam:  Filed Vitals:   07/03/15 1527  BP: 130/84  Pulse: 60  Temp: 98 F (36.7 C)  Resp: 16   IMPRESSION: 1. Interval revision of infrarenal bifurcated stent graft, patent with persistent type 2 endoleak. Stable 6.4 cm native sac diameter. 2. Continues exclusion of bilateral  common iliac artery aneurysms. 3. 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 The CTA was reviewed by Dr. Oneida Alar with the patient today.  Incision:  Well healed Abdomin soft non tender    Assessment/Plan:  This is a 55 y.o. male who is s/p: Endovascular repair of type I proximal endoleak 32 x 4 cm proximal cuff.  Gore Excluder 12/15. He had embolization of right internal iliac as well with extension to right external and left common. Preoperatively his abdominal aortic aneurysm 6 cm in diameter. Right common iliac was 3 cm left common iliac 2.2 cm.  We will refer him for evaluation and intervention by Dr. Amelia Jo interventional Radiologist.     Theda Sers, [facility] PA-C [facility] [phone]  Clinic MD:  Pt seen and examined with Dr. Valinda Hoar   On exam today: Abdomen is soft nontender no hematoma in the groin History and exam findings as above. Patient originally had a Gore Excluder stent graft placed in January 2015. At that time we also embolize the right internal iliac artery aneurysm and extended in the right external iliac artery. He was noted recently had a type II possible type I endoleak. He had a proximal Gore Excluder cuff placed on 04/17/2015. Follow-up  CT today shows that the type I leak is completely resolved. The accessory right renal artery is still patent as well as the main right renal and left renal arteries. Since the aneurysm is greater than 6 cm in diameter and he has a persistent type II leak I feel the best option for this would be coil embolization of this. Abdominal aortic aneurysm diameter 6 cm prior to placing his original stent graft. Current aneurysm diameter is 6.4 cm. I referred him back to Dr. Ronnell Guadalajara for coil embolization of the type II endoleak. He will follow-up with me after his embolization procedure. Of note the patient's chronic left groin pain may be slightly improved he did not really mention that  much today.   [name], MD [facility] [place] Office: [phone] Pager: [phone]

## 2015-07-04 ENCOUNTER — Telehealth (HOSPITAL_COMMUNITY): Payer: Self-pay | Admitting: Interventional Radiology

## 2015-07-04 ENCOUNTER — Other Ambulatory Visit (HOSPITAL_COMMUNITY): Payer: Self-pay | Admitting: Interventional Radiology

## 2015-07-04 DIAGNOSIS — T82330D Leakage of aortic (bifurcation) graft (replacement), subsequent encounter: Secondary | ICD-10-CM

## 2015-07-04 DIAGNOSIS — IMO0001 Reserved for inherently not codable concepts without codable children: Secondary | ICD-10-CM

## 2015-07-04 NOTE — Telephone Encounter (Signed)
Called pt, left VM for him to call to schedule his procedure. Daniel Blevins 

## 2015-07-08 ENCOUNTER — Ambulatory Visit
Admission: RE | Admit: 2015-07-08 | Discharge: 2015-07-08 | Disposition: A | Discharge status: Home / Self Care | Payer: Managed Care, Other (non HMO) | Source: Ambulatory Visit | Attending: Vascular Surgery | Admitting: Vascular Surgery

## 2015-07-08 DIAGNOSIS — T82330D Leakage of aortic (bifurcation) graft (replacement), subsequent encounter: Secondary | ICD-10-CM

## 2015-07-08 DIAGNOSIS — I714 Abdominal aortic aneurysm, without rupture, unspecified: Secondary | ICD-10-CM

## 2015-07-08 DIAGNOSIS — IMO0001 Reserved for inherently not codable concepts without codable children: Secondary | ICD-10-CM

## 2015-07-08 NOTE — Progress Notes (Signed)
Chief Complaint: Follow up after Type I Endoleak repair  Referring Physician(s): Portage E  Supervising Physician: Jacqulynn Cadet  History of Present Illness: Daniel Blevins is a 55 y.o. male with a history of abdominal aortic aneurysm status post endovascular aortic repair with a Gore Excluder bifurcated endograft in December 2015.   EVAR was complicated by a type I leak which was repaired by Dr. Oneida Alar on 05/26/2015.  Over the past year, his aneurysm had enlarged from 5.9 x 5.7 cm to 6.4 x 5.9 cm.  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    He is here today accompanied by his wife to discuss findings on CTA done 06/26/2015 and to plan repair of the type II leak.   Past Medical History  Diagnosis Date  . Hx of CABG   . Hypertension   . 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 Coronary artery disease   . History of kidney stones   . Deafness in right ear   . Dysrhythmia   . Sleep apnea   . Depression   . Urinary frequency     Past Surgical History  Procedure Laterality Date  . Laparoscopic gastric bypass  2010    [facility]  . Laparoscopy N/A 03/06/2013    Procedure: LAPAROSCOPY DIAGNOSTIC;  Surgeon: Adin Hector, MD;  Location: [facility];  Service: General;  Laterality: N/A;  . Laparoscopic lysis of adhesions N/A 03/06/2013    Procedure: LAPAROSCOPIC LYSIS OF ADHESIONS AND CLOSURE OF INTERNAL HERNIAS x2;  Surgeon: Adin Hector, MD;  Location: [facility];  Service: General;  Laterality: N/A;  . Coronary artery bypass graft  2001  . Cardiac catheterization    . Back surgery  2009    lower back  . Cystoscopy with retrograde pyelogram, ureteroscopy and stent placement Bilateral 02/07/2014    Procedure: CYSTOSCOPY WITH BILATERAL RETROGRADE PYELOGRAM, BILATERAL URETEROSCOPY, RIGHT EXTRACTION OF STONES AND RIGHT STENT PLACEMENT;  Surgeon: Jorja Loa, MD;  Location: [facility];   Service: Urology;  Laterality: Bilateral;  . Abdominal aortic endovascular stent graft N/A 04/30/2014    Procedure: ABDOMINAL AORTIC ENDOVASCULAR STENT GRAFT;  Surgeon: Elam Dutch, MD;  Location: [place];  Service: Vascular;  Laterality: N/A;  . Peripheral vascular catheterization Right 04/30/2014    Procedure: EMBOLIZATION right internal iliac;  Surgeon: Elam Dutch, MD;  Location: [place];  Service: Vascular;  Laterality: Right;  . Colonoscopy    . Esophagogastroduodenoscopy    . Abdominal aortic endovascular stent graft N/A 05/26/2015    Procedure: ABDOMINAL AORTIC ENDOVASCULAR STENT GRAFT RE-INTERVENTION; REPAIR OF PROXIMAL CUFF;  Surgeon: Elam Dutch, MD;  Location: [facility];  Service: Vascular;  Laterality: N/A;    Allergies: Review of patient's allergies indicates no known allergies.  Medications: Prior to Admission medications   Medication Sig Start Date End Date Taking? Authorizing Provider  amLODipine (NORVASC) 10 MG tablet Take 10 mg by mouth daily.   Yes Historical Provider, MD  aspirin 81 MG tablet Take 81 mg by mouth every morning.    Yes Historical Provider, MD  buPROPion (WELLBUTRIN XL) 150 MG 24 hr tablet Take 150 mg by mouth daily.  10/02/14  Yes Historical Provider, MD  carvedilol (COREG) 25 MG tablet Take 2 tablets (50 mg total) by mouth every morning. 06/20/15  Yes Belva Crome, MD  cholecalciferol (VITAMIN D) 1000 UNITS tablet Take 2,000 Units by mouth daily.  Yes Historical Provider, MD  Ferrous Sulfate (IRON) 325 (65 FE) MG TABS Take 1 tablet by mouth every morning.   Yes Historical Provider, MD  KLOR-CON M20 20 MEQ tablet TAKE 2 TABLETS EVERY       MORNING 04/09/15  Yes Belva Crome, MD  lisinopril (PRINIVIL,ZESTRIL) 40 MG tablet TAKE 1 TABLET EVERY MORNING 05/19/15  Yes Belva Crome, MD  magnesium oxide (MAG-OX) 400 MG tablet Take 250 mg by mouth every morning.    Yes Historical Provider, MD  mirabegron ER (MYRBETRIQ) 50 MG TB24 tablet Take 50 mg by mouth  daily.   Yes Historical Provider, MD  Multiple Vitamin (MULTIVITAMIN WITH MINERALS) TABS tablet Take 1 tablet by mouth every morning.   Yes Historical Provider, MD  simvastatin (ZOCOR) 20 MG tablet Take 1 tablet (20 mg total) by mouth every morning. 03/14/14  Yes Belva Crome, MD  vitamin B-12 (CYANOCOBALAMIN) 1000 MCG tablet Take 1,000 mcg by mouth daily.   Yes Historical Provider, MD     Family History  Problem Relation Age of Onset  . Lung cancer Mother   . Cancer Mother     Lung  . AAA (abdominal aortic aneurysm) Father   . Heart disease Father     before age 76  . Heart disease Brother     before age 53    Social History   Social History  . Marital Status: Married    Spouse Name: N/A  . Number of Children: N/A  . Years of Education: N/A   Social History Main Topics  . Smoking status: Never Smoker   . Smokeless tobacco: Never Used  . Alcohol Use: No  . Drug Use: No  . Sexual Activity: No   Other Topics Concern  . Not on file   Social History Narrative    Review of Systems  Vital Signs: BP 143/86 mmHg  Pulse 62  Temp(Src) 98.1 F (36.7 C) (Oral)  Resp 15  Ht 5\' 11"  (1.803 m)  Wt 255 lb (115.667 kg)  BMI 35.58 kg/m2  SpO2 97%  Physical Exam  Constitutional: He is oriented to person, place, and time. He appears well-developed and well-nourished.  HENT:  Head: Atraumatic.  Eyes: EOM are normal.  Cardiovascular: Normal rate, regular rhythm and normal heart sounds.   No murmur heard. Pulmonary/Chest: Effort normal and breath sounds normal. No respiratory distress. He has no wheezes.  Abdominal: Soft. Bowel sounds are normal. He exhibits no mass.  Musculoskeletal: Normal range of motion.  Neurological: He is alert and oriented to person, place, and time.  Skin: Skin is warm and dry.  Psychiatric: He has a normal mood and affect. His behavior is normal. Judgment and thought content normal.  Vitals reviewed.    Imaging: Ct Angio Abd/pel W/ And/or  W/o  06/26/2015  CLINICAL DATA:  EVAR placed 12/15 F/u Hx of gastric bypass EXAM: CT ANGIOGRAPHY ABDOMEN AND PELVIS TECHNIQUE: Multidetector CT imaging of the abdomen and pelvis was performed using the standard protocol during bolus administration of intravenous contrast. Multiplanar reconstructed images including MIPs were obtained and reviewed to evaluate the vascular anatomy. CONTRAST:  80 mL Isovue 370 IV COMPARISON:  COMPARISON Arteriogram 05/20/2015 and previous studies FINDINGS: ARTERIAL Coronary calcifications. Aorta: Unremarkable visualized distal descending thoracic and suprarenal segments. Interval revision of infrarenal bifurcated stent graft, proximal margin extends cephalad just inferior to the renal arteries, tines appeared well apposed, with no further evidence of the type 1 a endoleak seen previously. Large persistent  type 2 endoleak involving lumbar and inferior mesenteric arteries. Maximum native sac diameter 6.4 x 6.2 cm, stable by my measurement since prior scan of 04/10/2015. Celiac axis:          Patent Superior mesenteric:  Patent, classic distal branching. Left renal:           Single, patent Right renal:          Duplicated, superior dominant, both patent. Inferior mesenteric:  Patent, associated with endoleak. Left iliac: Left limb of the stent graft extends to the distal common iliac, well apposed. Exclusion of 3.3 cm fusiform common iliac aneurysm. 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 External iliac mildly ectatic. Right iliac: Right limb of stent graft extends into the proximal external iliac. Stable coil embolization of the internal iliac artery aneurysm, and exclusion of 3.5 cm fusiform common iliac aneurysm. The distal native external iliac artery is mildly ectatic, without dissection or stenosis. VENOUS Dedicated venous phase imaging not obtained. Bilateral pelvic phleboliths. Review of the MIP images confirms the above findings. NONVASCULAR Hepatobiliary: No masses  or other significant abnormality. Pancreas: No mass, inflammatory changes, or other significant abnormality. Spleen: Within normal limits in size and appearance. Adrenals/Urinary Tract: Sub cm low-attenuation left renal lesions probably cysts but incompletely characterized. No evidence of hydronephrosis. Normal adrenal glands. Stomach/Bowel: Multiple surgical staple lines from previous gastric bypass. No evidence of obstruction, inflammatory process, or abnormal fluid collections. Lymphatic: No pathologically enlarged lymph nodes. Reproductive: No mass or other significant abnormality. Other: No ascites.  No free air. Musculoskeletal: Mild degenerative changes in bilateral hips. Mild spondylitic changes in the lower lumbar spine. Previous median sternotomy. IMPRESSION: 1. Interval revision of infrarenal bifurcated stent graft, patent with persistent type 2 endoleak. Stable 6.4 cm native sac diameter. 2. Continues exclusion of bilateral common iliac artery aneurysms. 3. 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 Electronically Signed   By: Lucrezia Europe M.D.   On: 06/26/2015 10:28    Labs:  CBC:  Recent Labs  05/22/15 1501 05/26/15 1000 05/27/15 0400 06/06/15 1425  WBC 4.0 3.6* 4.5 4.2  HGB 11.5* 10.4* 10.1* 11.3*  HCT 36.0* 31.9* 31.6* 35.0*  PLT 123* 107* 114* 179    COAGS:  Recent Labs  05/20/15 0640 05/22/15 1501 05/26/15 1000  INR 1.19 1.20 1.36  APTT 28 39* 31    BMP:  Recent Labs  05/20/15 0640 05/22/15 1501 05/26/15 1000 05/27/15 0400 06/06/15 1425  NA 143 142 144 141 144  K 3.4* 3.5 3.4* 3.2* 3.7  CL 106 108 107 104  --   CO2 28 25 29 28 26   GLUCOSE 118* 84 94 162* 66*  BUN 14 15 12 13  14.6  CALCIUM 8.3* 8.4* 7.9* 7.9* 8.6  CREATININE 1.14 1.31* 1.27* 1.19 1.3  GFRNONAA >60 >60 >60 >60  --   GFRAA >60 >60 >60 >60  --     LIVER FUNCTION TESTS:  Recent Labs  05/22/15 1501 06/06/15 1425  BILITOT 0.4 <0.30  AST 21 17  ALT 20 18   ALKPHOS 59 70  PROT 6.9 7.6  6.9  ALBUMIN 3.4* 3.4*    TUMOR MARKERS: No results for input(s): AFPTM, CEA, CA199, CHROMGRNA in the last 8760 hours.  Assessment:  History of abdominal aortic aneurysm status post endovascular aortic repair with a Gore Excluder bifurcated endograft in December 2015.   EVAR complicated by a type I leak which was repaired by Dr. Oneida Alar on 05/26/2015.  Type II endoleak originating  from the inferior mesenteric artery without flow via the lumbar arteries.    Dr. Laurence Ferrari discussed the need to repair the type II endoleak with patient and his wife today. He discussed risks, benefits, option of close monitoring with imaging. Given his history and the size of his aneurysm, best treatment plan is to proceed with endovascular repair of type II endoleak.  Will plan repair of type II endoleak in the next week or two by Dr. Laurence Ferrari.  Electronically Signed: Murrell Redden PA-C 07/08/2015, 9:17 AM   Please refer to Dr. Katrinka Blazing attestation of this note for management and plan.

## 2015-07-15 ENCOUNTER — Other Ambulatory Visit: Payer: Self-pay | Admitting: Radiology

## 2015-07-15 NOTE — Patient Instructions (Signed)
Review instructions NPO after MN am meds with sips needs driver

## 2015-07-16 ENCOUNTER — Other Ambulatory Visit: Payer: Self-pay | Admitting: Radiology

## 2015-07-17 ENCOUNTER — Ambulatory Visit: Payer: Managed Care, Other (non HMO) | Admitting: Interventional Cardiology

## 2015-07-17 ENCOUNTER — Other Ambulatory Visit (HOSPITAL_COMMUNITY): Payer: Self-pay | Admitting: Interventional Radiology

## 2015-07-17 ENCOUNTER — Ambulatory Visit (HOSPITAL_COMMUNITY)
Admission: RE | Admit: 2015-07-17 | Discharge: 2015-07-17 | Disposition: A | Discharge status: Home / Self Care | Payer: Managed Care, Other (non HMO) | Source: Ambulatory Visit | Attending: Interventional Radiology | Admitting: Interventional Radiology

## 2015-07-17 DIAGNOSIS — Z951 Presence of aortocoronary bypass graft: Secondary | ICD-10-CM | POA: Insufficient documentation

## 2015-07-17 DIAGNOSIS — F329 Major depressive disorder, single episode, unspecified: Secondary | ICD-10-CM | POA: Diagnosis not present

## 2015-07-17 DIAGNOSIS — Z87442 Personal history of urinary calculi: Secondary | ICD-10-CM | POA: Insufficient documentation

## 2015-07-17 DIAGNOSIS — I10 Essential (primary) hypertension: Secondary | ICD-10-CM | POA: Diagnosis not present

## 2015-07-17 DIAGNOSIS — Z7982 Long term (current) use of aspirin: Secondary | ICD-10-CM | POA: Insufficient documentation

## 2015-07-17 DIAGNOSIS — T82330A Leakage of aortic (bifurcation) graft (replacement), initial encounter: Secondary | ICD-10-CM | POA: Diagnosis not present

## 2015-07-17 DIAGNOSIS — Y832 Surgical operation with anastomosis, bypass or graft as the cause of abnormal reaction of the patient, or of later complication, without mention of misadventure at the time of the procedure: Secondary | ICD-10-CM | POA: Diagnosis not present

## 2015-07-17 DIAGNOSIS — IMO0001 Reserved for inherently not codable concepts without codable children: Secondary | ICD-10-CM

## 2015-07-17 DIAGNOSIS — I2510 Atherosclerotic heart disease of native coronary artery without angina pectoris: Secondary | ICD-10-CM | POA: Insufficient documentation

## 2015-07-17 DIAGNOSIS — Z9884 Bariatric surgery status: Secondary | ICD-10-CM | POA: Insufficient documentation

## 2015-07-17 DIAGNOSIS — G4730 Sleep apnea, unspecified: Secondary | ICD-10-CM | POA: Insufficient documentation

## 2015-07-17 DIAGNOSIS — T82330D Leakage of aortic (bifurcation) graft (replacement), subsequent encounter: Secondary | ICD-10-CM

## 2015-07-17 DIAGNOSIS — I714 Abdominal aortic aneurysm, without rupture: Secondary | ICD-10-CM | POA: Insufficient documentation

## 2015-07-17 DIAGNOSIS — Z8249 Family history of ischemic heart disease and other diseases of the circulatory system: Secondary | ICD-10-CM | POA: Diagnosis not present

## 2015-07-17 LAB — CBC
HEMATOCRIT: 36.2 % — AB (ref 39.0–52.0)
Hemoglobin: 11.7 g/dL — ABNORMAL LOW (ref 13.0–17.0)
MCH: 25.3 pg — ABNORMAL LOW (ref 26.0–34.0)
MCHC: 32.3 g/dL (ref 30.0–36.0)
MCV: 78.2 fL (ref 78.0–100.0)
PLATELETS: 140 10*3/uL — AB (ref 150–400)
RBC: 4.63 MIL/uL (ref 4.22–5.81)
RDW: 15.4 % (ref 11.5–15.5)
WBC: 3.6 10*3/uL — AB (ref 4.0–10.5)

## 2015-07-17 LAB — PROTIME-INR
INR: 1.2 (ref 0.00–1.49)
Prothrombin Time: 15.4 seconds — ABNORMAL HIGH (ref 11.6–15.2)

## 2015-07-17 LAB — BASIC METABOLIC PANEL
Anion gap: 11 (ref 5–15)
BUN: 11 mg/dL (ref 6–20)
CO2: 28 mmol/L (ref 22–32)
Calcium: 8.4 mg/dL — ABNORMAL LOW (ref 8.9–10.3)
Chloride: 105 mmol/L (ref 101–111)
Creatinine, Ser: 1.18 mg/dL (ref 0.61–1.24)
GFR calc Af Amer: >60 mL/min (ref >60)
Glucose, Bld: 117 mg/dL — ABNORMAL HIGH (ref 65–99)
POTASSIUM: 3.2 mmol/L — AB (ref 3.5–5.1)
SODIUM: 144 mmol/L (ref 135–145)

## 2015-07-17 LAB — APTT: aPTT: 30 seconds (ref 24–37)

## 2015-07-17 MED ORDER — MIDAZOLAM HCL 2 MG/2ML IJ SOLN
INTRAMUSCULAR | Status: AC
  Filled 2015-07-17: qty 4

## 2015-07-17 MED ORDER — FENTANYL CITRATE (PF) 100 MCG/2ML IJ SOLN
INTRAMUSCULAR | Status: AC | PRN
  Administered 2015-07-17: 100 ug via INTRAVENOUS
  Administered 2015-07-17: 50 ug via INTRAVENOUS
  Administered 2015-07-17: 75 ug via INTRAVENOUS
  Administered 2015-07-17: 50 ug via INTRAVENOUS

## 2015-07-17 MED ORDER — FENTANYL CITRATE (PF) 100 MCG/2ML IJ SOLN
INTRAMUSCULAR | Status: AC
  Filled 2015-07-17: qty 4

## 2015-07-17 MED ORDER — LIDOCAINE HCL 1 % IJ SOLN
INTRAMUSCULAR | Status: AC
  Filled 2015-07-17: qty 20

## 2015-07-17 MED ORDER — MIDAZOLAM HCL 2 MG/2ML IJ SOLN
INTRAMUSCULAR | Status: AC | PRN
  Administered 2015-07-17 (×4): 1 mg via INTRAVENOUS

## 2015-07-17 MED ORDER — SODIUM CHLORIDE 0.9 % IV SOLN
INTRAVENOUS | Status: DC
Start: 2015-07-17 — End: 2015-07-18

## 2015-07-17 MED ORDER — HYDROCODONE-ACETAMINOPHEN 5-325 MG PO TABS: 1.0000 | ORAL_TABLET | ORAL | Status: DC | PRN

## 2015-07-17 MED ORDER — SODIUM CHLORIDE 0.9 % IV SOLN: Freq: Once | INTRAVENOUS | Status: DC

## 2015-07-17 MED ORDER — IOHEXOL 300 MG/ML  SOLN
300.0000 mL | Freq: Once | INTRAMUSCULAR | Status: AC | PRN
  Administered 2015-07-17: 75 mL via INTRAVENOUS

## 2015-07-17 NOTE — Sedation Documentation (Signed)
Patient is resting comfortably. 

## 2015-07-17 NOTE — Discharge Instructions (Signed)
Angiogram, Care After °Refer to this sheet in the next few weeks. These instructions provide you with information about caring for yourself after your procedure. Your health care provider may also give you more specific instructions. Your treatment has been planned according to current medical practices, but problems sometimes occur. Call your health care provider if you have any problems or questions after your procedure. °WHAT TO EXPECT AFTER THE PROCEDURE °After your procedure, it is typical to have the following: °· Bruising at the catheter insertion site that usually fades within 1-2 weeks. °· Blood collecting in the tissue (hematoma) that may be painful to the touch. It should usually decrease in size and tenderness within 1-2 weeks. °HOME CARE INSTRUCTIONS °· Take medicines only as directed by your health care provider. °· You may shower 24-48 hours after the procedure or as directed by your health care provider. Remove the bandage (dressing) and gently wash the site with plain soap and water. Pat the area dry with a clean towel. Do not rub the site, because this may cause bleeding. °· Do not take baths, swim, or use a hot tub until your health care provider approves. °· Check your insertion site every day for redness, swelling, or drainage. °· Do not apply powder or lotion to the site. °· Do not lift over 10 lb (4.5 kg) for 5 days after your procedure or as directed by your health care provider. °· Ask your health care provider when it is okay to: °¨ Return to work or school. °¨ Resume usual physical activities or sports. °¨ Resume sexual activity. °· Do not drive home if you are discharged the same day as the procedure. Have someone else drive you. °· You may drive 24 hours after the procedure unless otherwise instructed by your health care provider. °· Do not operate machinery or power tools for 24 hours after the procedure or as directed by your health care provider. °· If your procedure was done as an  outpatient procedure, which means that you went home the same day as your procedure, a responsible adult should be with you for the first 24 hours after you arrive home. °· Keep all follow-up visits as directed by your health care provider. This is important. °SEEK MEDICAL CARE IF: °· You have a fever. °· You have chills. °· You have increased bleeding from the catheter insertion site. Hold pressure on the site and call 911. °SEEK IMMEDIATE MEDICAL CARE IF: °· You have unusual pain at the catheter insertion site. °· You have redness, warmth, or swelling at the catheter insertion site. °· You have drainage (other than a small amount of blood on the dressing) from the catheter insertion site. °· The catheter insertion site is bleeding, and the bleeding does not stop after 30 minutes of holding steady pressure on the site. °· The area near or just beyond the catheter insertion site becomes pale, cool, tingly, or numb. °  °This information is not intended to replace advice given to you by your health care provider. Make sure you discuss any questions you have with your health care provider. °  °Document Released: 11/05/2004 Document Revised: 05/10/2014 Document Reviewed: 09/20/2012 °Elsevier Interactive Patient Education ©2016 [employer] ° °

## 2015-07-17 NOTE — Sedation Documentation (Signed)
Patient is resting comfortably. Vital signs stable. 

## 2015-07-17 NOTE — Sedation Documentation (Signed)
Patient denies pain and is resting comfortably.  

## 2015-07-17 NOTE — Procedures (Signed)
Interventional Radiology Procedure Note  Procedure: Endovascular Type 2 endoleak Repair.  1.) Mesenteric angiogram 2.) Coil embo L4 lumbar arteries 3.) Onyx liquid embolization right L3 lumbar and aneurysm sack 4.) Combo coil and Onxy embo IMA origin  Arterial Access: Right CFA, 81F.  Cordis ExoSeal  Complications: None  Estimated Blood Loss: 0  Recommendations: - Bedrest x 4 hrs - Anticiapte DC home this afternoon   Signed,  Criselda Peaches, MD

## 2015-07-17 NOTE — Progress Notes (Signed)
Assumed care of pt from Tonya Harrelson, RN. Assessment documented. 

## 2015-07-17 NOTE — H&P (Signed)
Chief Complaint: Patient was seen in consultation today for No chief complaint on file.  at the request of Dr [name]  Referring Physician(s): [facility] Dr [name]  Supervising Physician: Jacqulynn Cadet  History of Present Illness: Daniel Blevins is a 55 y.o. male   History of abdominal aortic aneurysm status post endovascular aortic repair with a Gore Excluder bifurcated endograft in December 2015. 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 Over the past year, his aneurysm has enlarged from 5.9 x 5.7 cm to 6.4 x 5.9 cm. Additionally, the proximal aspect of the graft is located slightly lower than expected with respect to the lowest renal artery resulting in a seal zone at the lower limits of normal. There is a possibility that there is a contributing type IA endoleak.  Arteriogram 05/20/2015: IMPRESSION: 1. Diagnostic angiography is positive for a type 1A endoleak at the proximal seal zone. 2. Accessory right renal artery supplying the lower pole of the right kidney (approximately 15- 20% total right renal parenchymal volume). 3. The middle colic artery is replaced to the celiac artery and there is a robust marginal artery of [place] communicating with the inferior mesenteric artery and then retrograde into the aneurysm sac consistent with a superimposed type 2 endoleak. This overlaps with the type 1A endoleak described above. 4. 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 There is no evidence of arterial venous fistula or arterial venous malformation in this region. Of note, flow is relatively slow in the aneurysmal internal iliac artery and throughout the gluteal artery aneurysms. 5. No evidence of hemodynamically significant stenosis  involving the profunda femoral artery or its branches.  Has undergone Type 1 Endoleak repair with Dr Oneida Alar 05/26/2015  06/26/2015: CTA Abd/pelvis: IMPRESSION: 1. Interval revision of infrarenal bifurcated stent graft, patent with persistent type 2 endoleak. Stable 6.4 cm native sac diameter. 2. Continues exclusion of bilateral common iliac artery aneurysms. 3. 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  07/08/2015 follow up with Dr Laurence Ferrari:  Reviewedmost recent CTA. The extension of his endovascular aortic repair appears excellently position. There is no evidence of type I endoleak. However, there is a persistent robust type II endoleak. Discussed this with Daniel Blevins and his wife. They are in agreement that they would like to proceed with endovascular repair of his type II endoleak now rather than wait for further demonstration of continued growth following repair of the type I be endoleak.  Scheduled for procedure today.  Past Medical History  Diagnosis Date  . Hx of CABG   . Hypertension   . 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 Coronary artery disease   . History of kidney stones   . Deafness in right ear   . Dysrhythmia   . Sleep apnea   . Depression   . Urinary frequency     Past Surgical History  Procedure Laterality Date  . Laparoscopic gastric bypass  2010    [facility]  . Laparoscopy N/A 03/06/2013    Procedure: LAPAROSCOPY DIAGNOSTIC;  Surgeon: Adin Hector, MD;  Location: [facility];  Service: General;  Laterality: N/A;  . Laparoscopic lysis of adhesions N/A 03/06/2013    Procedure: LAPAROSCOPIC LYSIS OF ADHESIONS AND CLOSURE OF INTERNAL HERNIAS x2;  Surgeon: Adin Hector, MD;  Location: [facility];  Service: General;  Laterality: N/A;  . Coronary artery bypass graft  2001  . Cardiac catheterization    . Back surgery  2009    lower back  . Cystoscopy with retrograde pyelogram, ureteroscopy and stent placement Bilateral 02/07/2014     Procedure: CYSTOSCOPY WITH BILATERAL RETROGRADE PYELOGRAM, BILATERAL URETEROSCOPY, RIGHT EXTRACTION OF STONES AND RIGHT STENT PLACEMENT;  Surgeon: Jorja Loa, MD;  Location: [facility];  Service: Urology;  Laterality: Bilateral;  . Abdominal aortic endovascular stent graft N/A 04/30/2014    Procedure: ABDOMINAL AORTIC ENDOVASCULAR STENT GRAFT;  Surgeon: Elam Dutch, MD;  Location: [place];  Service: Vascular;  Laterality: N/A;  . Peripheral vascular catheterization Right 04/30/2014    Procedure: EMBOLIZATION right internal iliac;  Surgeon: Elam Dutch, MD;  Location: [place];  Service: Vascular;  Laterality: Right;  . Colonoscopy    . Esophagogastroduodenoscopy    . Abdominal aortic endovascular stent graft N/A 05/26/2015    Procedure: ABDOMINAL AORTIC ENDOVASCULAR STENT GRAFT RE-INTERVENTION; REPAIR OF PROXIMAL CUFF;  Surgeon: Elam Dutch, MD;  Location: [facility];  Service: Vascular;  Laterality: N/A;    Allergies: Review of patient's allergies indicates no known allergies.  Medications: Prior to Admission medications   Medication Sig Start Date End Date Taking? Authorizing Provider  amLODipine (NORVASC) 10 MG tablet Take 10 mg by mouth daily.   Yes Historical Provider, MD  aspirin 81 MG tablet Take 81 mg by mouth every morning.    Yes Historical Provider, MD  buPROPion (WELLBUTRIN XL) 150 MG 24 hr tablet Take 150 mg by mouth daily.  10/02/14  Yes Historical Provider, MD  carvedilol (COREG) 25 MG tablet Take 2 tablets (50 mg total) by mouth every morning. 06/20/15  Yes Belva Crome, MD  cholecalciferol (VITAMIN D) 1000 UNITS tablet Take 2,000 Units by mouth daily.   Yes Historical Provider, MD  Ferrous Sulfate (IRON) 325 (65 FE) MG TABS Take 1 tablet by mouth every morning.   Yes Historical Provider, MD  KLOR-CON M20 20 MEQ tablet TAKE 2 TABLETS EVERY       MORNING 04/09/15  Yes Belva Crome, MD  lisinopril (PRINIVIL,ZESTRIL) 40 MG tablet TAKE 1 TABLET EVERY MORNING 05/19/15  Yes  Belva Crome, MD  magnesium oxide (MAG-OX) 400 MG tablet Take 250 mg by mouth every morning.    Yes Historical Provider, MD  mirabegron ER (MYRBETRIQ) 50 MG TB24 tablet Take 50 mg by mouth daily.   Yes Historical Provider, MD  Multiple Vitamin (MULTIVITAMIN WITH MINERALS) TABS tablet Take 1 tablet by mouth every morning.   Yes Historical Provider, MD  simvastatin (ZOCOR) 20 MG tablet Take 1 tablet (20 mg total) by mouth every morning. 03/14/14  Yes Belva Crome, MD  vitamin B-12 (CYANOCOBALAMIN) 1000 MCG tablet Take 1,000 mcg by mouth daily.   Yes Historical Provider, MD     Family History  Problem Relation Age of Onset  . Lung cancer Mother   . Cancer Mother     Lung  . AAA (abdominal aortic aneurysm) Father   . Heart disease Father     before age 39  . Heart disease Brother     before age 62    Social History   Social History  . Marital Status: Married    Spouse Name: N/A  . Number of Children: N/A  . Years of Education: N/A   Social History Main Topics  . Smoking status: Never Smoker   . Smokeless  tobacco: Never Used  . Alcohol Use: No  . Drug Use: No  . Sexual Activity: No   Other Topics Concern  . Not on file   Social History Narrative     Review of Systems: A 12 point ROS discussed and pertinent positives are indicated in the HPI above.  All other systems are negative.  Review of Systems  Constitutional: Negative for fever, activity change, appetite change and fatigue.  Respiratory: Negative for shortness of breath.   Cardiovascular: Negative for chest pain.  Musculoskeletal: Negative for back pain.  Neurological: Negative for weakness.  Psychiatric/Behavioral: Negative for behavioral problems and confusion.    Vital Signs: BP 133/85 mmHg  Pulse 63  Temp(Src) 97.6 F (36.4 C) (Oral)  Resp 16  Ht 5\' 11"  (1.803 m)  Wt 255 lb (115.667 kg)  BMI 35.58 kg/m2  SpO2 97%  Physical Exam  Constitutional: He is oriented to person, place, and time.    Cardiovascular: Normal rate, regular rhythm and normal heart sounds.   Pulmonary/Chest: Effort normal and breath sounds normal. He has no wheezes.  Abdominal: Soft. Bowel sounds are normal. There is no tenderness.  Musculoskeletal: Normal range of motion.  Neurological: He is alert and oriented to person, place, and time.  Skin: Skin is warm and dry.  Psychiatric: He has a normal mood and affect. His behavior is normal. Judgment and thought content normal.  Nursing note and vitals reviewed.   Mallampati Score:  MD Evaluation Airway: WNL Heart: WNL Abdomen: WNL Chest/ Lungs: WNL ASA  Classification: 2 Mallampati/Airway Score: One  Imaging: Ct Angio Abd/pel W/ And/or W/o  06/26/2015  CLINICAL DATA:  EVAR placed 12/15 F/u Hx of gastric bypass EXAM: CT ANGIOGRAPHY ABDOMEN AND PELVIS TECHNIQUE: Multidetector CT imaging of the abdomen and pelvis was performed using the standard protocol during bolus administration of intravenous contrast. Multiplanar reconstructed images including MIPs were obtained and reviewed to evaluate the vascular anatomy. CONTRAST:  80 mL Isovue 370 IV COMPARISON:  COMPARISON Arteriogram 05/20/2015 and previous studies FINDINGS: ARTERIAL Coronary calcifications. Aorta: Unremarkable visualized distal descending thoracic and suprarenal segments. Interval revision of infrarenal bifurcated stent graft, proximal margin extends cephalad just inferior to the renal arteries, tines appeared well apposed, with no further evidence of the type 1 a endoleak seen previously. Large persistent type 2 endoleak involving lumbar and inferior mesenteric arteries. Maximum native sac diameter 6.4 x 6.2 cm, stable by my measurement since prior scan of 04/10/2015. Celiac axis:          Patent Superior mesenteric:  Patent, classic distal branching. Left renal:           Single, patent Right renal:          Duplicated, superior dominant, both patent. Inferior mesenteric:  Patent, associated with  endoleak. Left iliac: Left limb of the stent graft extends to the distal common iliac, well apposed. Exclusion of 3.3 cm fusiform common iliac aneurysm. 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 External iliac mildly ectatic. Right iliac: Right limb of stent graft extends into the proximal external iliac. Stable coil embolization of the internal iliac artery aneurysm, and exclusion of 3.5 cm fusiform common iliac aneurysm. The distal native external iliac artery is mildly ectatic, without dissection or stenosis. VENOUS Dedicated venous phase imaging not obtained. Bilateral pelvic phleboliths. Review of the MIP images confirms the above findings. NONVASCULAR Hepatobiliary: No masses or other significant abnormality. Pancreas: No mass, inflammatory changes, or other significant abnormality. Spleen: Within normal limits in size and  appearance. Adrenals/Urinary Tract: Sub cm low-attenuation left renal lesions probably cysts but incompletely characterized. No evidence of hydronephrosis. Normal adrenal glands. Stomach/Bowel: Multiple surgical staple lines from previous gastric bypass. No evidence of obstruction, inflammatory process, or abnormal fluid collections. Lymphatic: No pathologically enlarged lymph nodes. Reproductive: No mass or other significant abnormality. Other: No ascites.  No free air. Musculoskeletal: Mild degenerative changes in bilateral hips. Mild spondylitic changes in the lower lumbar spine. Previous median sternotomy. IMPRESSION: 1. Interval revision of infrarenal bifurcated stent graft, patent with persistent type 2 endoleak. Stable 6.4 cm native sac diameter. 2. Continues exclusion of bilateral common iliac artery aneurysms. 3. 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 Electronically Signed   By: Lucrezia Europe M.D.   On: 06/26/2015 10:28    Labs:  CBC:  Recent Labs  05/26/15 1000 05/27/15 0400 06/06/15 1425 07/17/15 0748  WBC 3.6* 4.5 4.2 3.6*  HGB 10.4*  10.1* 11.3* 11.7*  HCT 31.9* 31.6* 35.0* 36.2*  PLT 107* 114* 179 140*    COAGS:  Recent Labs  05/20/15 0640 05/22/15 1501 05/26/15 1000  INR 1.19 1.20 1.36  APTT 28 39* 31    BMP:  Recent Labs  05/20/15 0640 05/22/15 1501 05/26/15 1000 05/27/15 0400 06/06/15 1425  NA 143 142 144 141 144  K 3.4* 3.5 3.4* 3.2* 3.7  CL 106 108 107 104  --   CO2 28 25 29 28 26   GLUCOSE 118* 84 94 162* 66*  BUN 14 15 12 13  14.6  CALCIUM 8.3* 8.4* 7.9* 7.9* 8.6  CREATININE 1.14 1.31* 1.27* 1.19 1.3  GFRNONAA >60 >60 >60 >60  --   GFRAA >60 >60 >60 >60  --     LIVER FUNCTION TESTS:  Recent Labs  05/22/15 1501 06/06/15 1425  BILITOT 0.4 <0.30  AST 21 17  ALT 20 18  ALKPHOS 59 70  PROT 6.9 7.6  6.9  ALBUMIN 3.4* 3.4*    TUMOR MARKERS: No results for input(s): AFPTM, CEA, CA199, CHROMGRNA in the last 8760 hours.  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 All of the patient's questions were answered, patient is agreeable to proceed. Consent signed and in chart.  Thank you for this interesting consult.  I greatly enjoyed meeting Daniel Blevins and look forward to participating in Daniel Blevins.  A copy of this report was sent to the requesting provider on this date.  Electronically Signed: Monia Sabal A 07/17/2015, 8:20 AM   I spent a total of  30 Minutes   in face to face in clinical consultation, greater than 50% of which was counseling/coordinating Blevins for type 2 endoleak repair

## 2015-07-22 ENCOUNTER — Other Ambulatory Visit (HOSPITAL_COMMUNITY): Payer: Self-pay | Admitting: Interventional Radiology

## 2015-07-22 DIAGNOSIS — IMO0001 Reserved for inherently not codable concepts without codable children: Secondary | ICD-10-CM

## 2015-07-22 DIAGNOSIS — T82330D Leakage of aortic (bifurcation) graft (replacement), subsequent encounter: Secondary | ICD-10-CM

## 2015-07-22 DIAGNOSIS — I714 Abdominal aortic aneurysm, without rupture, unspecified: Secondary | ICD-10-CM

## 2015-07-29 ENCOUNTER — Ambulatory Visit (HOSPITAL_COMMUNITY): Admission: RE | Admit: 2015-07-29 | Payer: Managed Care, Other (non HMO) | Source: Ambulatory Visit

## 2015-08-14 ENCOUNTER — Ambulatory Visit
Admission: RE | Admit: 2015-08-14 | Discharge: 2015-08-14 | Disposition: A | Discharge status: Home / Self Care | Payer: Managed Care, Other (non HMO) | Source: Ambulatory Visit | Attending: Interventional Radiology | Admitting: Interventional Radiology

## 2015-08-14 DIAGNOSIS — T82330D Leakage of aortic (bifurcation) graft (replacement), subsequent encounter: Secondary | ICD-10-CM

## 2015-08-14 DIAGNOSIS — IMO0001 Reserved for inherently not codable concepts without codable children: Secondary | ICD-10-CM

## 2015-08-14 DIAGNOSIS — I714 Abdominal aortic aneurysm, without rupture, unspecified: Secondary | ICD-10-CM

## 2015-08-14 NOTE — Progress Notes (Signed)
Chief Complaint: Patient was seen today for follow up of Endoleak repair   History of Present Illness: Daniel Blevins is a 55 y.o. male who underwent second stage treatment and repair of Endoleak on 3/16. See procedure note below for details. He did very well from the procedure and has had no post-op issues. He denies abd pain, back pain. He previously had some odd (L)LE discomfort from the groin to his knee, likely some sort of vascular claudication. But he has noticed some improvement since this past procedure. He denies pain further into his legs, nor other typical claudication sxs. He remains on an Aspirin daily. Does not smoke.  Past Medical History  Diagnosis Date  . Hx of CABG   . Hypertension   . 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 Coronary artery disease   . History of kidney stones   . Deafness in right ear   . Dysrhythmia   . Sleep apnea   . Depression   . Urinary frequency     Past Surgical History  Procedure Laterality Date  . Laparoscopic gastric bypass  2010    [facility]  . Laparoscopy N/A 03/06/2013    Procedure: LAPAROSCOPY DIAGNOSTIC;  Surgeon: Adin Hector, MD;  Location: [facility];  Service: General;  Laterality: N/A;  . Laparoscopic lysis of adhesions N/A 03/06/2013    Procedure: LAPAROSCOPIC LYSIS OF ADHESIONS AND CLOSURE OF INTERNAL HERNIAS x2;  Surgeon: Adin Hector, MD;  Location: [facility];  Service: General;  Laterality: N/A;  . Coronary artery bypass graft  2001  . Cardiac catheterization    . Back surgery  2009    lower back  . Cystoscopy with retrograde pyelogram, ureteroscopy and stent placement Bilateral 02/07/2014    Procedure: CYSTOSCOPY WITH BILATERAL RETROGRADE PYELOGRAM, BILATERAL URETEROSCOPY, RIGHT EXTRACTION OF STONES AND RIGHT STENT PLACEMENT;  Surgeon: Jorja Loa, MD;  Location: [facility];  Service: Urology;  Laterality: Bilateral;  . Abdominal aortic endovascular stent graft N/A 04/30/2014    Procedure: ABDOMINAL  AORTIC ENDOVASCULAR STENT GRAFT;  Surgeon: Elam Dutch, MD;  Location: [place];  Service: Vascular;  Laterality: N/A;  . Peripheral vascular catheterization Right 04/30/2014    Procedure: EMBOLIZATION right internal iliac;  Surgeon: Elam Dutch, MD;  Location: [place];  Service: Vascular;  Laterality: Right;  . Colonoscopy    . Esophagogastroduodenoscopy    . Abdominal aortic endovascular stent graft N/A 05/26/2015    Procedure: ABDOMINAL AORTIC ENDOVASCULAR STENT GRAFT RE-INTERVENTION; REPAIR OF PROXIMAL CUFF;  Surgeon: Elam Dutch, MD;  Location: [facility];  Service: Vascular;  Laterality: N/A;    Allergies: Review of patient's allergies indicates no known allergies.  Medications: Prior to Admission medications   Medication Sig Start Date End Date Taking? Authorizing Provider  aspirin 81 MG tablet Take 81 mg by mouth every morning.    Yes Historical Provider, MD  buPROPion (WELLBUTRIN XL) 150 MG 24 hr tablet Take 150 mg by mouth daily.  10/02/14  Yes Historical Provider, MD  carvedilol (COREG) 25 MG tablet Take 2 tablets (50 mg total) by mouth every morning. 06/20/15  Yes Belva Crome, MD  cholecalciferol (VITAMIN D) 1000 UNITS tablet Take 2,000 Units by mouth daily.   Yes Historical Provider, MD  Ferrous Sulfate (IRON) 325 (65 FE) MG TABS Take 1 tablet by mouth every morning.   Yes Historical Provider, MD  KLOR-CON M20 20 MEQ tablet TAKE 2 TABLETS EVERY       MORNING  04/09/15  Yes Belva Crome, MD  lisinopril (PRINIVIL,ZESTRIL) 40 MG tablet TAKE 1 TABLET EVERY MORNING 05/19/15  Yes Belva Crome, MD  mirabegron ER (MYRBETRIQ) 50 MG TB24 tablet Take 50 mg by mouth daily.   Yes Historical Provider, MD  Multiple Vitamin (MULTIVITAMIN WITH MINERALS) TABS tablet Take 1 tablet by mouth every morning.   Yes Historical Provider, MD  simvastatin (ZOCOR) 20 MG tablet Take 1 tablet (20 mg total) by mouth every morning. 03/14/14  Yes Belva Crome, MD  vitamin B-12 (CYANOCOBALAMIN) 1000 MCG tablet  Take 1,000 mcg by mouth daily.   Yes Historical Provider, MD  amLODipine (NORVASC) 10 MG tablet Take 10 mg by mouth daily.    Historical Provider, MD  magnesium oxide (MAG-OX) 400 MG tablet Take 250 mg by mouth every morning. Reported on 08/14/2015    Historical Provider, MD     Family History  Problem Relation Age of Onset  . Lung cancer Mother   . Cancer Mother     Lung  . AAA (abdominal aortic aneurysm) Father   . Heart disease Father     before age 73  . Heart disease Brother     before age 91    Social History   Social History  . Marital Status: Married    Spouse Name: N/A  . Number of Children: N/A  . Years of Education: N/A   Social History Main Topics  . Smoking status: Never Smoker   . Smokeless tobacco: Never Used  . Alcohol Use: No  . Drug Use: No  . Sexual Activity: No   Other Topics Concern  . Not on file   Social History Narrative    Review of Systems: A 12 point ROS discussed and pertinent positives are indicated in the HPI above.  All other systems are negative.  Review of Systems  Vital Signs: BP 139/88 mmHg  Pulse 60  Temp(Src) 98.3 F (36.8 C) (Oral)  Resp 14  Ht 5\' 11"  (1.803 m)  Wt 255 lb (115.667 kg)  BMI 35.58 kg/m2  SpO2 98%  Physical Exam  Constitutional: He is oriented to person, place, and time. He appears well-developed. No distress.  Cardiovascular: Normal rate, regular rhythm and normal heart sounds.   Bilateral femoral and pedal pulses intact. Legs warm Trace to 1+ edema  Pulmonary/Chest: Effort normal and breath sounds normal. No respiratory distress.  Neurological: He is alert and oriented to person, place, and time.    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 The type 1A endoleak has been surgically repaired. He now presents for  endovascular closure of his type 2 endoleak. EXAM: SELECTIVE VISCERAL ARTERIOGRAPHY; ADDITIONAL ARTERIOGRAPHY; IR ULTRASOUND GUIDANCE VASC ACCESS RIGHT; IR EMBO ARTERIAL NOT HEMORR HEMANG INC GUIDE ROADMAPPING; ARTERIOGRAPHY Interventional Radiologist:  Criselda Peaches, MD MEDICATIONS: None ANESTHESIA/SEDATION: Fentanyl 5 mcg IV; Versed 300 mg IV Moderate Sedation Time:  120 minutes The patient was continuously monitored during the procedure by the interventional radiology nurse under my direct supervision. CONTRAST:  16mL OMNIPAQUE IOHEXOL 300 MG/ML  SOLN FLUOROSCOPY TIME:  Fluoroscopy Time: 37 minutes 24 seconds (3,743 mGy). COMPLICATIONS: None immediate. PROCEDURE: Informed consent was obtained from the patient following explanation of the procedure, risks, benefits and alternatives. The patient understands, agrees and consents for the procedure. All questions were addressed. A time  out was performed. Maximal barrier sterile technique utilized including caps, mask, sterile gowns, sterile gloves, large sterile drape, hand hygiene, and Betadine skin prep. The right groin was interrogated with ultrasound. The right common femoral artery is widely patent. An image was obtained and stored for the medical record. Local anesthesia was attained by infiltration with 1% lidocaine. A small dermatotomy was made. Under real-time sonographic guidance, the the common femoral artery was punctured with a 21 gauge micropuncture needle. An image was obtained and stored for the medical record using standard technique, the needle was exchanged over a micro wire for a 4 [place] transitional micro sheath. The micro sheath was then exchanged over a Bentson wire for a working 5 [place] vascular sheath. A C2 Cobra catheter was then advanced in the abdominal aorta in used to select the celiac artery. A celiac arteriogram was performed. The origin of the replaced middle colloid artery was successfully identified. Using a glidewire, the C2  catheter was advanced into the middle colloid artery. An arteriogram was performed. This confirms antegrade flow from the middle colloid artery into the marginal artery of [place], left colloid artery, inferior mesenteric artery and into the aneurysm sac. After filling of the aneurysm sac the outflow appears to be via paired L4 lumbar arteries which arise from a common trunk. The C2 cobra catheter was advanced into the marginal artery of [place]. An arteriogram was performed in angiographic mapped stored. A re- bar 14 micro catheter was then advanced into the right folic artery. Arteriography was performed confirming the pathway into the inferior mesenteric artery. The micro catheter was then advanced into the inferior mesenteric artery and an arteriogram was performed confirming retrograde flow in the inferior mesenteric artery and into the aneurysm sac. The micro catheter was next advanced into the aneurysm sac. Arteriography was performed demonstrating the flow channels heading toward the right and inferior to the paired L4 lumbar arteries. The micro catheter was successfully advanced into the common trunk of the L4 lumbar arteries demonstrating the origins of the right and left lumbar arteries. The left L4 lumbar artery was selected first. This vessel was coil embolized using a combination of 3, 3.5 and 4 mm detachable Axium Prime microcoils. The catheter was next advanced into the right L4 lumbar artery. This was also coil embolized using a combination of 3, 4 and 5 mm detachable Axium Prime microcoils. Finally, the common trunk was embolized using 6 mm detachable microcoils. Instillation of liquid Onyx 18 embolic agent was then performed to sealed coil pack in begin filling the aneurysm sac. As the on ex was injected the micro catheter was slowly withdrawn. On ex refluxed into the right L3 lumbar artery forming a cast of the vessel origin. Ultimately, there was excellent filling of the aneurysm sac and  reflux of contrast material into the origin of the IMA. Liquid embolic embolization was stopped at this time. After the on ex cast had a hardened, an arteriogram was performed confirming hemostasis an absent filling in the aneurysm sac. To exclude the possibility of recanalization, the micro catheter was advanced to the edge of the on ex cast in the origin of the IMA in the origin the IMA was coil embolized using two 6 mm [place] scientific vortex Diamond coils. A final post embolization arteriogram confirmed no filling of the aneurysm sac. The catheters were removed. A limited right common femoral arteriogram was performed confirming common femoral arterial access. Hemostasis was attained with the assistance of a Cordis Exoseal extra arterial  vascular plug. IMPRESSION: 1. Successful endovascular repair of a type 2 endoleak including coil embolization of the paired L4 lumbar arteries, liquid embolic embolization of the aneurysm sac and right L3 lumbar artery as well as combined liquid and coil embolization of the origin of the inferior mesenteric artery. 2. No evidence of persistent contrast opacification of the aneurysm sac. PLAN: Follow-up clinic visit in 3 months with CTA of the abdomen and pelvis. Signed, Criselda Peaches, MD [facility] Electronically Signed   By: Jacqulynn Cadet M.D.   On: 07/17/2015 17:02   Assessment and Plan: Endovascular Type 2 endoleak s/p repair as below: Coil embo L4 lumbar arteries Onyx liquid embolization right L3 lumbar and aneurysm sack Combo coil and Onxy embo IMA origin  We reviewed all the images from the procedure with the pt in detail and explained that we feel the procedure was highly successful. We are pleased that his LLE sxs are improving, unknown if this was related to the procedure, but better none the less. He will have a follow up CTA in 2 months time to assess everything. We encourage him to get  back into his exercise regimen, and that he should have no restrictions.  Electronically Signed: Ascencion Dike 08/14/2015, 10:01 AM   I spent a total of 25 minutes in face to face in clinical consultation, greater than 50% of which was counseling/coordinating care for endo leak repair.

## 2015-09-10 ENCOUNTER — Other Ambulatory Visit: Payer: Self-pay | Admitting: Interventional Cardiology

## 2015-09-16 ENCOUNTER — Ambulatory Visit: Payer: Managed Care, Other (non HMO) | Admitting: Hematology

## 2015-09-18 ENCOUNTER — Other Ambulatory Visit (HOSPITAL_COMMUNITY): Payer: Self-pay | Admitting: Interventional Radiology

## 2015-09-18 ENCOUNTER — Other Ambulatory Visit: Payer: Self-pay | Admitting: Radiology

## 2015-09-18 DIAGNOSIS — I714 Abdominal aortic aneurysm, without rupture, unspecified: Secondary | ICD-10-CM

## 2015-09-18 DIAGNOSIS — IMO0001 Reserved for inherently not codable concepts without codable children: Secondary | ICD-10-CM

## 2015-09-18 DIAGNOSIS — T82330D Leakage of aortic (bifurcation) graft (replacement), subsequent encounter: Secondary | ICD-10-CM

## 2015-09-24 ENCOUNTER — Encounter: Payer: Self-pay | Admitting: Radiology

## 2015-10-01 NOTE — Progress Notes (Signed)
No show

## 2015-10-02 ENCOUNTER — Ambulatory Visit (INDEPENDENT_AMBULATORY_CARE_PROVIDER_SITE_OTHER): Payer: Self-pay | Admitting: Interventional Cardiology

## 2015-10-02 DIAGNOSIS — E785 Hyperlipidemia, unspecified: Secondary | ICD-10-CM

## 2015-10-02 DIAGNOSIS — I6523 Occlusion and stenosis of bilateral carotid arteries: Secondary | ICD-10-CM

## 2015-10-02 DIAGNOSIS — I25810 Atherosclerosis of coronary artery bypass graft(s) without angina pectoris: Secondary | ICD-10-CM

## 2015-10-02 DIAGNOSIS — I714 Abdominal aortic aneurysm, without rupture: Secondary | ICD-10-CM

## 2015-10-02 DIAGNOSIS — I10 Essential (primary) hypertension: Secondary | ICD-10-CM

## 2015-10-03 ENCOUNTER — Encounter: Payer: Self-pay | Admitting: Interventional Cardiology

## 2015-10-07 ENCOUNTER — Other Ambulatory Visit: Payer: Self-pay | Admitting: *Deleted

## 2015-10-07 DIAGNOSIS — E785 Hyperlipidemia, unspecified: Secondary | ICD-10-CM

## 2015-10-07 MED ORDER — CARVEDILOL 25 MG PO TABS: ORAL_TABLET | ORAL | Status: DC

## 2015-10-07 MED ORDER — LISINOPRIL 40 MG PO TABS: 40.0000 mg | ORAL_TABLET | Freq: Every morning | ORAL | Status: DC

## 2015-10-15 ENCOUNTER — Other Ambulatory Visit: Payer: Managed Care, Other (non HMO)

## 2015-10-15 ENCOUNTER — Other Ambulatory Visit (HOSPITAL_COMMUNITY): Payer: Managed Care, Other (non HMO)

## 2015-10-15 ENCOUNTER — Ambulatory Visit (HOSPITAL_COMMUNITY): Payer: Managed Care, Other (non HMO) | Attending: Interventional Radiology

## 2015-10-17 ENCOUNTER — Encounter (HOSPITAL_COMMUNITY): Payer: Self-pay | Admitting: Emergency Medicine

## 2015-10-17 ENCOUNTER — Emergency Department (HOSPITAL_COMMUNITY)
Admission: EM | Admit: 2015-10-17 | Discharge: 2015-10-17 | Disposition: A | Discharge status: Home / Self Care | Payer: Managed Care, Other (non HMO) | Attending: Emergency Medicine | Admitting: Emergency Medicine

## 2015-10-17 DIAGNOSIS — R55 Syncope and collapse: Secondary | ICD-10-CM

## 2015-10-17 DIAGNOSIS — Z7982 Long term (current) use of aspirin: Secondary | ICD-10-CM | POA: Insufficient documentation

## 2015-10-17 DIAGNOSIS — I10 Essential (primary) hypertension: Secondary | ICD-10-CM | POA: Diagnosis not present

## 2015-10-17 DIAGNOSIS — R110 Nausea: Secondary | ICD-10-CM | POA: Diagnosis not present

## 2015-10-17 DIAGNOSIS — I2510 Atherosclerotic heart disease of native coronary artery without angina pectoris: Secondary | ICD-10-CM | POA: Insufficient documentation

## 2015-10-17 DIAGNOSIS — Z951 Presence of aortocoronary bypass graft: Secondary | ICD-10-CM | POA: Insufficient documentation

## 2015-10-17 DIAGNOSIS — Z79899 Other long term (current) drug therapy: Secondary | ICD-10-CM | POA: Diagnosis not present

## 2015-10-17 LAB — CBC WITH DIFFERENTIAL/PLATELET
BASOS ABS: 0 10*3/uL (ref 0.0–0.1)
BASOS PCT: 0 %
EOS ABS: 0 10*3/uL (ref 0.0–0.7)
Eosinophils Relative: 1 %
HEMATOCRIT: 34.1 % — AB (ref 39.0–52.0)
Hemoglobin: 11 g/dL — ABNORMAL LOW (ref 13.0–17.0)
Lymphocytes Relative: 23 %
Lymphs Abs: 1 10*3/uL (ref 0.7–4.0)
MCH: 24 pg — ABNORMAL LOW (ref 26.0–34.0)
MCHC: 32.3 g/dL (ref 30.0–36.0)
MCV: 74.5 fL — ABNORMAL LOW (ref 78.0–100.0)
MONO ABS: 0.2 10*3/uL (ref 0.1–1.0)
MONOS PCT: 6 %
NEUTROS ABS: 3.1 10*3/uL (ref 1.7–7.7)
NEUTROS PCT: 71 %
Platelets: 126 10*3/uL — ABNORMAL LOW (ref 150–400)
RBC: 4.58 MIL/uL (ref 4.22–5.81)
RDW: 15.1 % (ref 11.5–15.5)
WBC: 4.3 10*3/uL (ref 4.0–10.5)

## 2015-10-17 LAB — I-STAT CHEM 8, ED
BUN: 15 mg/dL (ref 6–20)
Calcium, Ion: 0.93 mmol/L — ABNORMAL LOW (ref 1.12–1.23)
Chloride: 101 mmol/L (ref 101–111)
Creatinine, Ser: 1 mg/dL (ref 0.61–1.24)
GLUCOSE: 115 mg/dL — AB (ref 65–99)
HCT: 37 % — ABNORMAL LOW (ref 39.0–52.0)
HEMOGLOBIN: 12.6 g/dL — AB (ref 13.0–17.0)
POTASSIUM: 3.2 mmol/L — AB (ref 3.5–5.1)
Sodium: 141 mmol/L (ref 135–145)
TCO2: 25 mmol/L (ref 0–100)

## 2015-10-17 MED ORDER — ONDANSETRON HCL 4 MG/2ML IJ SOLN
4.0000 mg | Freq: Once | INTRAMUSCULAR | Status: AC
  Administered 2015-10-17: 4 mg via INTRAVENOUS
  Filled 2015-10-17: qty 2

## 2015-10-17 MED ORDER — SODIUM CHLORIDE 0.9 % IV BOLUS (SEPSIS)
1000.0000 mL | Freq: Once | INTRAVENOUS | Status: AC
  Administered 2015-10-17: 1000 mL via INTRAVENOUS

## 2015-10-17 MED ORDER — ONDANSETRON HCL 4 MG PO TABS
4.0000 mg | ORAL_TABLET | Freq: Four times a day (QID) | ORAL | Status: DC
Start: 2015-10-17 — End: 2016-01-01

## 2015-10-17 NOTE — ED Provider Notes (Signed)
CSN: [id number]     Arrival date & time 10/17/15  1546 History   First MD Initiated Contact with Patient 10/17/15 1744     Chief Complaint  Patient presents with  . Near Syncope    HPI Comments: 55 year old male who presents with acute onset of lightheadedness, nausea, and near syncope while shopping today. PMH significant for HTN, Dyslipidemia, CAD s/p CABG, s/p gastric bypass, s/p AAA repair. He states he has been planning an event for work and has been "running around" for the past several days. He states he has not been eating or drinking like he should. Today while he was walking at the store he had an acute onset of lightheadedness. He felt like he was going to pass out so he sat down. EMS was called to the scene. CBG checked was 138. He decided to come to the ER to be further evaluated. Reports associated feeling of dry mouth and feeling thirsty. Denies LOC, headache, weakness, chest pain, SOB, abdominal pain, diarrhea.  Patient is a 55 y.o. male presenting with near-syncope.  Near Syncope Associated symptoms include nausea. Pertinent negatives include no abdominal pain, chest pain, chills, fever, headaches, vomiting or weakness.    Past Medical History  Diagnosis Date  . Hx of CABG   . Hypertension   . 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 Coronary artery disease   . History of kidney stones   . Deafness in right ear   . Dysrhythmia   . Sleep apnea   . Depression   . Urinary frequency    Past Surgical History  Procedure Laterality Date  . Laparoscopic gastric bypass  2010    [facility]  . Laparoscopy N/A 03/06/2013    Procedure: LAPAROSCOPY DIAGNOSTIC;  Surgeon: Adin Hector, MD;  Location: [facility];  Service: General;  Laterality: N/A;  . Laparoscopic lysis of adhesions N/A 03/06/2013    Procedure: LAPAROSCOPIC LYSIS OF ADHESIONS AND CLOSURE OF INTERNAL HERNIAS x2;  Surgeon: Adin Hector, MD;  Location: [facility];  Service: General;  Laterality: N/A;  . Coronary artery  bypass graft  2001  . Cardiac catheterization    . Back surgery  2009    lower back  . Cystoscopy with retrograde pyelogram, ureteroscopy and stent placement Bilateral 02/07/2014    Procedure: CYSTOSCOPY WITH BILATERAL RETROGRADE PYELOGRAM, BILATERAL URETEROSCOPY, RIGHT EXTRACTION OF STONES AND RIGHT STENT PLACEMENT;  Surgeon: Jorja Loa, MD;  Location: [facility];  Service: Urology;  Laterality: Bilateral;  . Abdominal aortic endovascular stent graft N/A 04/30/2014    Procedure: ABDOMINAL AORTIC ENDOVASCULAR STENT GRAFT;  Surgeon: Elam Dutch, MD;  Location: [place];  Service: Vascular;  Laterality: N/A;  . Peripheral vascular catheterization Right 04/30/2014    Procedure: EMBOLIZATION right internal iliac;  Surgeon: Elam Dutch, MD;  Location: [place];  Service: Vascular;  Laterality: Right;  . Colonoscopy    . Esophagogastroduodenoscopy    . Abdominal aortic endovascular stent graft N/A 05/26/2015    Procedure: ABDOMINAL AORTIC ENDOVASCULAR STENT GRAFT RE-INTERVENTION; REPAIR OF PROXIMAL CUFF;  Surgeon: Elam Dutch, MD;  Location: [facility] OR;  Service: Vascular;  Laterality: N/A;   Family History  Problem Relation Age of Onset  . Lung cancer Mother   . Cancer Mother     Lung  . AAA (abdominal aortic aneurysm) Father   . Heart disease Father     before age 27  . Heart disease Brother     before age 83  Social History  Substance Use Topics  . Smoking status: Never Smoker   . Smokeless tobacco: Never Used  . Alcohol Use: No    Review of Systems  Constitutional: Negative for fever and chills.  Respiratory: Negative for shortness of breath.   Cardiovascular: Positive for near-syncope. Negative for chest pain.  Gastrointestinal: Positive for nausea. Negative for vomiting and abdominal pain.  Neurological: Positive for light-headedness. Negative for dizziness, syncope, speech difficulty, weakness and headaches.      Allergies  Review of patient's allergies indicates  no known allergies.  Home Medications   Prior to Admission medications   Medication Sig Start Date End Date Taking? Authorizing Provider  amLODipine (NORVASC) 10 MG tablet Take 10 mg by mouth daily.   Yes Historical Provider, MD  aspirin 81 MG tablet Take 81 mg by mouth every morning.    Yes Historical Provider, MD  buPROPion (WELLBUTRIN XL) 150 MG 24 hr tablet Take 150 mg by mouth daily.  10/02/14  Yes Historical Provider, MD  carvedilol (COREG) 25 MG tablet TAKE 2 TABLETS BY MOUTH EVERY MORNING 10/07/15  Yes Belva Crome, MD  Ferrous Sulfate (IRON) 325 (65 FE) MG TABS Take 1 tablet by mouth every morning.   Yes Historical Provider, MD  KLOR-CON M20 20 MEQ tablet TAKE 2 TABLETS EVERY       MORNING. PLEASE MAKE AN    APPOINTMENT WITH YOUR      DOCTOR 09/11/15  Yes Belva Crome, MD  lisinopril (PRINIVIL,ZESTRIL) 40 MG tablet Take 1 tablet (40 mg total) by mouth every morning. 10/07/15  Yes Belva Crome, MD  mirabegron ER (MYRBETRIQ) 50 MG TB24 tablet Take 50 mg by mouth every evening.    Yes Historical Provider, MD  simvastatin (ZOCOR) 20 MG tablet Take 1 tablet (20 mg total) by mouth every morning. 03/14/14  Yes Belva Crome, MD  vitamin B-12 (CYANOCOBALAMIN) 1000 MCG tablet Take 1,000 mcg by mouth daily.   Yes Historical Provider, MD   BP 128/68 mmHg  Pulse 81  Temp(Src) 97.8 F (36.6 C) (Oral)  Resp 21  SpO2 100%   Physical Exam  Constitutional: He is oriented to person, place, and time. He appears well-developed and well-nourished. No distress.  HENT:  Head: Normocephalic and atraumatic.  Eyes: Conjunctivae are normal. Pupils are equal, round, and reactive to light. Right eye exhibits no discharge. Left eye exhibits no discharge. No scleral icterus.  Neck: Normal range of motion.  Cardiovascular: Normal rate and regular rhythm.  Exam reveals no gallop and no friction rub.   No murmur heard. Pulmonary/Chest: Effort normal and breath sounds normal. No respiratory distress. He has no  wheezes. He has no rales. He exhibits no tenderness.  Abdominal: Soft. Bowel sounds are normal. He exhibits no distension and no mass. There is no tenderness. There is no rebound and no guarding.  Neurological: He is alert and oriented to person, place, and time.  Skin: Skin is warm and dry. He is not diaphoretic. No pallor.  Psychiatric: He has a normal mood and affect.    ED Course  Procedures (including critical care time) Labs Review Labs Reviewed  CBC WITH DIFFERENTIAL/PLATELET - Abnormal; Notable for the following:    Hemoglobin 11.0 (*)    HCT 34.1 (*)    MCV 74.5 (*)    MCH 24.0 (*)    Platelets 126 (*)    All other components within normal limits  I-STAT CHEM 8, ED - Abnormal; Notable for the  following:    Potassium 3.2 (*)    Glucose, Bld 115 (*)    Calcium, Ion 0.93 (*)    Hemoglobin 12.6 (*)    HCT 37.0 (*)    All other components within normal limits    Imaging Review No results found. I have personally reviewed and evaluated these images and lab results as part of my medical decision-making.   EKG Interpretation   Date/Time:  Friday October 17 2015 16:04:08 EDT Ventricular Rate:  73 PR Interval:  157 QRS Duration: 103 QT Interval:  448 QTC Calculation: 494 R Axis:   35 Text Interpretation:  Sinus rhythm Abnormal R-wave progression, early  transition Probable inferior infarct, old Lateral leads are also involved  since last tracing no significant change Confirmed by Eulis Foster  MD, ELLIOTT  [phone]) on 10/17/2015 5:16:30 PM      MDM   Final diagnoses:  Nausea  Near syncope   55 year old male who presents with near syncope. Unclear etiology. Patient is afebrile, not tachycardic, normotensive, and not hypoxic. PE reveals no acute findings. Orthostatics are negative. 2L IVF and Zofran x 2 given with some symptomatic improvement. BMP remarkable for mild hypokalemia of 3.2 which is baseline and he is on PO K. He has mild anemia which is also at baseline and low  platelets (126). Will d/c home with symptomatic care. Zofran rx given. Patient is NAD, non-toxic, with stable VS. Patient is informed of clinical course, understands medical decision making process, and agrees with plan. Opportunity for questions provided and all questions answered. Return precautions given.     Recardo Evangelist, PA-C 10/18/15 1452  Daleen Bo, MD 10/18/15 [phone]

## 2015-10-17 NOTE — ED Notes (Signed)
Pt to ED via [facility] after reported having a near syncopal episode.  Pt was walking around in [employer] when he became nauseated and almost passed out.  Pt st's he sit down in a chair and started to feel better.  CBG by EMS was 138.  PJt denies any chest pain at this time.

## 2015-10-24 LAB — CREATININE WITH EST GFR
Creat: 1.14 mg/dL (ref 0.70–1.33)
GFR, Est African American: 84 mL/min (ref >=60)
GFR, Est Non African American: 73 mL/min (ref >=60)

## 2015-10-24 LAB — BUN: BUN: 19 mg/dL (ref 7–25)

## 2015-11-03 ENCOUNTER — Encounter: Payer: Self-pay | Admitting: Cardiology

## 2015-11-05 ENCOUNTER — Encounter (HOSPITAL_COMMUNITY): Payer: Self-pay

## 2015-11-05 ENCOUNTER — Ambulatory Visit (HOSPITAL_COMMUNITY)
Admission: RE | Admit: 2015-11-05 | Discharge: 2015-11-05 | Disposition: A | Discharge status: Home / Self Care | Payer: Managed Care, Other (non HMO) | Source: Ambulatory Visit | Attending: Interventional Radiology | Admitting: Interventional Radiology

## 2015-11-05 DIAGNOSIS — T82330D Leakage of aortic (bifurcation) graft (replacement), subsequent encounter: Secondary | ICD-10-CM | POA: Diagnosis not present

## 2015-11-05 DIAGNOSIS — Y832 Surgical operation with anastomosis, bypass or graft as the cause of abnormal reaction of the patient, or of later complication, without mention of misadventure at the time of the procedure: Secondary | ICD-10-CM | POA: Insufficient documentation

## 2015-11-05 DIAGNOSIS — IMO0001 Reserved for inherently not codable concepts without codable children: Secondary | ICD-10-CM

## 2015-11-05 DIAGNOSIS — I714 Abdominal aortic aneurysm, without rupture, unspecified: Secondary | ICD-10-CM

## 2015-11-05 DIAGNOSIS — N281 Cyst of kidney, acquired: Secondary | ICD-10-CM | POA: Insufficient documentation

## 2015-11-05 DIAGNOSIS — N200 Calculus of kidney: Secondary | ICD-10-CM | POA: Diagnosis not present

## 2015-11-05 DIAGNOSIS — I723 Aneurysm of iliac artery: Secondary | ICD-10-CM | POA: Diagnosis not present

## 2015-11-05 MED ORDER — IOPAMIDOL (ISOVUE-370) INJECTION 76%
INTRAVENOUS | Status: AC
  Filled 2015-11-05: qty 100

## 2015-11-05 MED ORDER — IOPAMIDOL (ISOVUE-370) INJECTION 76%
INTRAVENOUS | Status: AC
  Administered 2015-11-05: 100 mL
  Filled 2015-11-05: qty 100

## 2015-11-06 ENCOUNTER — Ambulatory Visit
Admission: RE | Admit: 2015-11-06 | Discharge: 2015-11-06 | Disposition: A | Discharge status: Home / Self Care | Payer: Managed Care, Other (non HMO) | Source: Ambulatory Visit | Attending: Interventional Radiology | Admitting: Interventional Radiology

## 2015-11-06 DIAGNOSIS — I714 Abdominal aortic aneurysm, without rupture, unspecified: Secondary | ICD-10-CM

## 2015-11-06 DIAGNOSIS — IMO0001 Reserved for inherently not codable concepts without codable children: Secondary | ICD-10-CM

## 2015-11-06 DIAGNOSIS — T82330D Leakage of aortic (bifurcation) graft (replacement), subsequent encounter: Secondary | ICD-10-CM

## 2015-11-06 HISTORY — PX: IR GENERIC HISTORICAL: IMG1180011

## 2015-11-06 NOTE — Progress Notes (Signed)
Chief Complaint: Patient was seen in follow-up today for  Chief Complaint  Patient presents with  . Follow-up    3 mo follow up Type 2 Endoleak Repair   at the request of Chanetta Moosman  Referring Physician(s): [name]  History of Present Illness: Daniel Blevins is a 55 y.o. male with a history of abdominal aortic aneurysm status post endovascular aortic repair which is complicated by tape 1a and prominent type II endoleaks.  The type IA endoleak was appropriately repaired with a proximal cuff. He underwent endovascular repair of his type II endoleak on 07/17/2015 with coil embolization of the L4 lumbar arteries, liquid embolic embolization of the aneurysm sac and a combined liquid embolic and coil embolization of the IMA trunk.  He presents today for evaluation of his 3 month post procedure imaging and clinical assessment.  He is doing very well clinically. He denies abdominal or back pain. His intermittent left groin and upper thigh pain was doing better but has been flaring more significantly over the last several weeks due to an increase in activity. Whenever he is more active than his baseline he develops fairly severe and debilitating pain which lasts throughout the day but typically resolves by the next day. Because the pain resolves spontaneously he has not tried any additional medical therapies.  Past Medical History  Diagnosis Date  . Hx of CABG   . Hypertension   . 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 Coronary artery disease   . History of kidney stones   . Deafness in right ear   . Dysrhythmia   . Sleep apnea   . Depression   . Urinary frequency     Past Surgical History  Procedure Laterality Date  . Laparoscopic gastric bypass  2010    [facility]  . Laparoscopy N/A 03/06/2013    Procedure: LAPAROSCOPY DIAGNOSTIC;  Surgeon: Adin Hector, MD;  Location: [facility];  Service: General;  Laterality: N/A;  . Laparoscopic lysis of adhesions N/A 03/06/2013      Procedure: LAPAROSCOPIC LYSIS OF ADHESIONS AND CLOSURE OF INTERNAL HERNIAS x2;  Surgeon: Adin Hector, MD;  Location: [facility];  Service: General;  Laterality: N/A;  . Coronary artery bypass graft  2001  . Cardiac catheterization    . Back surgery  2009    lower back  . Cystoscopy with retrograde pyelogram, ureteroscopy and stent placement Bilateral 02/07/2014    Procedure: CYSTOSCOPY WITH BILATERAL RETROGRADE PYELOGRAM, BILATERAL URETEROSCOPY, RIGHT EXTRACTION OF STONES AND RIGHT STENT PLACEMENT;  Surgeon: Jorja Loa, MD;  Location: [facility];  Service: Urology;  Laterality: Bilateral;  . Abdominal aortic endovascular stent graft N/A 04/30/2014    Procedure: ABDOMINAL AORTIC ENDOVASCULAR STENT GRAFT;  Surgeon: Elam Dutch, MD;  Location: [place];  Service: Vascular;  Laterality: N/A;  . Peripheral vascular catheterization Right 04/30/2014    Procedure: EMBOLIZATION right internal iliac;  Surgeon: Elam Dutch, MD;  Location: [place];  Service: Vascular;  Laterality: Right;  . Colonoscopy    . Esophagogastroduodenoscopy    . Abdominal aortic endovascular stent graft N/A 05/26/2015    Procedure: ABDOMINAL AORTIC ENDOVASCULAR STENT GRAFT RE-INTERVENTION; REPAIR OF PROXIMAL CUFF;  Surgeon: Elam Dutch, MD;  Location: [facility];  Service: Vascular;  Laterality: N/A;    Allergies: Review of patient's allergies indicates no known allergies.  Medications: Prior to Admission medications   Medication Sig Start Date End Date Taking? Authorizing Provider  amLODipine (NORVASC) 10 MG tablet Take 10  mg by mouth daily.   Yes Historical Provider, MD  aspirin 81 MG tablet Take 81 mg by mouth every morning.    Yes Historical Provider, MD  buPROPion (WELLBUTRIN XL) 150 MG 24 hr tablet Take 150 mg by mouth daily.  10/02/14  Yes Historical Provider, MD  carvedilol (COREG) 25 MG tablet TAKE 2 TABLETS BY MOUTH EVERY MORNING 10/07/15  Yes Belva Crome, MD  Ferrous Sulfate (IRON) 325 (65 FE) MG TABS  Take 1 tablet by mouth every morning.   Yes Historical Provider, MD  KLOR-CON M20 20 MEQ tablet TAKE 2 TABLETS EVERY       MORNING. PLEASE MAKE AN    APPOINTMENT WITH YOUR      DOCTOR 09/11/15  Yes Belva Crome, MD  lisinopril (PRINIVIL,ZESTRIL) 40 MG tablet Take 1 tablet (40 mg total) by mouth every morning. 10/07/15  Yes Belva Crome, MD  mirabegron ER (MYRBETRIQ) 50 MG TB24 tablet Take 50 mg by mouth every evening.    Yes Historical Provider, MD  simvastatin (ZOCOR) 20 MG tablet Take 1 tablet (20 mg total) by mouth every morning. 03/14/14  Yes Belva Crome, MD  vitamin B-12 (CYANOCOBALAMIN) 1000 MCG tablet Take 1,000 mcg by mouth daily.   Yes Historical Provider, MD  ondansetron (ZOFRAN) 4 MG tablet Take 1 tablet (4 mg total) by mouth every 6 (six) hours. Patient not taking: Reported on 11/06/2015 10/17/15   Recardo Evangelist, PA-C     Family History  Problem Relation Age of Onset  . Lung cancer Mother   . Cancer Mother     Lung  . AAA (abdominal aortic aneurysm) Father   . Heart disease Father     before age 15  . Heart disease Brother     before age 10    Social History   Social History  . Marital Status: Married    Spouse Name: N/A  . Number of Children: N/A  . Years of Education: N/A   Social History Main Topics  . Smoking status: Never Smoker   . Smokeless tobacco: Never Used  . Alcohol Use: No  . Drug Use: No  . Sexual Activity: No   Other Topics Concern  . Not on file   Social History Narrative   Review of Systems: A 12 point ROS discussed and pertinent positives are indicated in the HPI above.  All other systems are negative.  Review of Systems  Vital Signs: BP 136/86 mmHg  Pulse 51  Temp(Src) 98 F (36.7 C) (Oral)  Resp 14  Ht 5\' 11"  (1.803 m)  Wt 255 lb (115.667 kg)  BMI 35.58 kg/m2  SpO2 98%  Physical Exam  Constitutional: He is oriented to person, place, and time. He appears well-developed and well-nourished. No distress.  HENT:  Head:  Normocephalic and atraumatic.  Eyes: No scleral icterus.  Cardiovascular: Normal rate and regular rhythm.   Pulmonary/Chest: Effort normal.  Neurological: He is alert and oriented to person, place, and time.  Skin: Skin is warm and dry.  Psychiatric: He has a normal mood and affect. His behavior is normal.  Nursing note and vitals reviewed.   Imaging: Ct Angio Abd/pel W/ And/or W/o  11/05/2015  CLINICAL DATA:  History of endovascular abdominal aortic aneurysm repair. Follow-up after type 2 endoleak repair. Prior surgical repair of a type 1 endoleak. EXAM: CT ANGIOGRAPHY ABDOMEN AND PELVIS TECHNIQUE: Multidetector CT imaging of the abdomen and pelvis was performed using the standard protocol during bolus  administration of intravenous contrast. Multiplanar reconstructed images including MIPs were obtained and reviewed to evaluate the vascular anatomy. CONTRAST:  100 mL Isovue 370 COMPARISON:  CTA 06/26/2015.  Endoleak repair images 07/17/2015 FINDINGS: ARTERIAL FINDINGS: Aorta: Status post endovascular repair of the abdominal aortic aneurysm with stent grafts. The stent graft is positioned below the main renal arteries. The graft and bilateral limbs are patent. Since the prior examination, there has been repair of the type 2 endoleak using a combination of liquid embolics and coil embolization. The aneurysm sac has minimally enlarged in size. Maximum AP dimension on sequence 4, image 94 measures 6.5 cm and previously measures 6.3 cm. Maximum diameter in an oblique plane measures up to 6.8 cm on sequence 4, image 94 previously measured 6.7 cm. No evidence for an endoleak on the arterial or delayed phase imaging. There is high-density Onyx material within the aneurysm sac surrounding the endograft. There is Onyx extending into 2 posterior lumbar branches. There are also coils involving the more caudal lumbar branches. There is also Onyx extending into the origin of the IMA with coils. Celiac axis: Celiac  trunk and main branch vessels are patent. Caudal branch of the celiac trunk supplies the middle colic artery. Superior mesenteric: SMA is widely patent. Again noted are large and ectatic jejunal branches along the left side of the SMA and similar to the previous examination. Left renal:          Main left renal artery is widely patent. Right renal: Main right renal artery is widely patent. There is a small accessory right renal artery just inferior to the main right renal artery which is patent. Inferior mesenteric: Origin of the IMA is occluded due to the embolic agents. There is reconstitution of the IMA branches. Left iliac: Left limb of the stent graft is patent and extends into the distal left common iliac artery. The left common iliac artery including the aneurysm sac measures roughly 2.4 cm and unchanged. Again noted is large fusiform aneurysm involving the left internal iliac artery measuring up to 2.4 cm and stable. Left external iliac artery and left proximal femoral arteries are patent. Right iliac: The right common iliac artery aneurysm sac measures up to 3.5 cm and stable. Again noted is coil embolization of the right internal iliac artery. The graft extends into the right external iliac artery. Proximal right femoral arteries are patent. Venous findings: No gross abnormality to the iliac veins, IVC renal veins. Review of the MIP images confirms the above findings. NONVASCULAR FINDINGS: Lung bases are clear. At least 1 punctate stone in the right kidney. There may be 1-2 tiny stones in the left kidney. Negative for hydronephrosis. Evidence for multiple left renal cysts. Questionable small cysts in the right kidney which are too small to definitively characterize. No acute abnormality to the adrenal glands. Probable small cyst along the medial right hepatic lobe. No gross abnormality to the liver or gallbladder. Normal appearance of the pancreas without inflammation or duct dilatation. Normal appearance  of spleen without enlargement. Evidence for prior gastric bypass procedure. No evidence for bowel obstruction. No suspicious lymphadenopathy in the abdomen or pelvis. No free fluid. Normal appearance of the prostate and seminal vesicles. No acute bone abnormality. IMPRESSION: Endovascular repair of the abdominal aortic aneurysm and repair of the type 1 and type 2 endoleaks. The aneurysm sac has minimally changed in size from the CT on 06/26/2015. There is no evidence for an endoleak at this time. Recommend continued follow-up. Stable aneurysm sacs  involving the bilateral common iliac arteries. 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 No acute abnormality within the abdomen or pelvis. Renal cysts.  Nonobstructive kidney stones. Electronically Signed   By: Markus Daft M.D.   On: 11/05/2015 12:49    Labs:  CBC:  Recent Labs  05/27/15 0400 06/06/15 1425 07/17/15 0748 10/17/15 1758 10/17/15 1806  WBC 4.5 4.2 3.6* 4.3  --   HGB 10.1* 11.3* 11.7* 11.0* 12.6*  HCT 31.6* 35.0* 36.2* 34.1* 37.0*  PLT 114* 179 140* 126*  --     COAGS:  Recent Labs  05/20/15 0640 05/22/15 1501 05/26/15 1000 07/17/15 0748  INR 1.19 1.20 1.36 1.20  APTT 28 39* 31 30    BMP:  Recent Labs  05/26/15 1000 05/27/15 0400 06/06/15 1425 07/17/15 0748 10/17/15 1806 10/24/15 1144  NA 144 141 144 144 141  --   K 3.4* 3.2* 3.7 3.2* 3.2*  --   CL 107 104  --  105 101  --   CO2 29 28 26 28   --   --   GLUCOSE 94 162* 66* 117* 115*  --   BUN 12 13 14.6 11 15 19   CALCIUM 7.9* 7.9* 8.6 8.4*  --   --   CREATININE 1.27* 1.19 1.3 1.18 1.00 1.14  GFRNONAA >60 >60  --  >60  --  73  GFRAA >60 >60  --  >60  --  84    LIVER FUNCTION TESTS:  Recent Labs  05/22/15 1501 06/06/15 1425  BILITOT 0.4 <0.30  AST 21 17  ALT 20 18  ALKPHOS 59 70  PROT 6.9 7.6  6.9  ALBUMIN 3.4* 3.4*    TUMOR MARKERS: No results for input(s): AFPTM, CEA, CA199, CHROMGRNA in the last 8760  hours.  Assessment and Plan:  Mr. Nicoloff 4 month post procedural CT scan demonstrates an excellent result with successful coil embolization of the afferent IMA trunk and efferent lumbar arteries as well as onyx filling of the flow channel within the aneurysm sac. There is no evidence of new or residual endoleak.  The aneurysm sac remains essentially stable with a question of perhaps 1 mm growth on one dimension.  His intermittent left groin and proximal thigh pain remains an issue. It is decreasing in frequency but does flare and cause him a significant amount of pain when he is overactive.  1.) Although there is no evidence of residual endoleak in the aneurysm sac is essentially stable, there is a question of perhaps 1 mm growth. Therefore, we will continue close observation with repeat CTA abdomen and pelvis in 6 months followed by a clinic visit.   Electronically Signed: Jacqulynn Cadet 11/06/2015, 9:53 AM   I spent a total of 15 Minutes in face to face in clinical consultation, greater than 50% of which was counseling/coordinating care for Endoleak status post endovascular aortic repair of abdominal aortic aneurysm.

## 2015-11-18 ENCOUNTER — Other Ambulatory Visit: Payer: Self-pay | Admitting: Interventional Cardiology

## 2015-11-19 NOTE — Telephone Encounter (Signed)
carvedilol (COREG) 25 MG tablet  Medication   Date: 10/07/2015  Department: [facility] [place] [facility]  Ordering/Authorizing: Belva Crome, MD      Order Providers    Prescribing Provider Encounter Provider   Belva Crome, MD Juventino Slovak, CMA    Medication Detail      Disp Refills Start End     carvedilol (COREG) 25 MG tablet 180 tablet 0 10/07/2015     Sig: TAKE 2 TABLETS BY MOUTH EVERY MORNING    E-Prescribing Status: Receipt confirmed by pharmacy (10/07/2015 9:32 AM EDT)     Pharmacy    [place], [place]

## 2015-12-31 DIAGNOSIS — R55 Syncope and collapse: Secondary | ICD-10-CM | POA: Insufficient documentation

## 2016-01-01 ENCOUNTER — Encounter (INDEPENDENT_AMBULATORY_CARE_PROVIDER_SITE_OTHER): Payer: Self-pay

## 2016-01-01 ENCOUNTER — Ambulatory Visit (INDEPENDENT_AMBULATORY_CARE_PROVIDER_SITE_OTHER): Payer: Managed Care, Other (non HMO) | Admitting: Interventional Cardiology

## 2016-01-01 ENCOUNTER — Encounter: Payer: Self-pay | Admitting: Interventional Cardiology

## 2016-01-01 VITALS — BP 112/84 | HR 65 | Ht 71.0 in | Wt 268.1 lb

## 2016-01-01 DIAGNOSIS — I10 Essential (primary) hypertension: Secondary | ICD-10-CM

## 2016-01-01 DIAGNOSIS — E785 Hyperlipidemia, unspecified: Secondary | ICD-10-CM | POA: Diagnosis not present

## 2016-01-01 DIAGNOSIS — R55 Syncope and collapse: Secondary | ICD-10-CM

## 2016-01-01 DIAGNOSIS — I25701 Atherosclerosis of coronary artery bypass graft(s), unspecified, with angina pectoris with documented spasm: Secondary | ICD-10-CM | POA: Diagnosis not present

## 2016-01-01 DIAGNOSIS — IMO0001 Reserved for inherently not codable concepts without codable children: Secondary | ICD-10-CM

## 2016-01-01 DIAGNOSIS — T82330A Leakage of aortic (bifurcation) graft (replacement), initial encounter: Secondary | ICD-10-CM | POA: Diagnosis not present

## 2016-01-01 MED ORDER — CARVEDILOL 25 MG PO TABS: ORAL_TABLET | ORAL | 3 refills | Status: DC

## 2016-01-01 MED ORDER — LISINOPRIL 40 MG PO TABS: 40.0000 mg | ORAL_TABLET | Freq: Every morning | ORAL | 3 refills | Status: DC

## 2016-01-01 NOTE — Progress Notes (Signed)
Cardiology Office Note    Date:  01/01/2016   ID:  Daniel Blevins, DOB [birth year], MRN [id number]  PCP:  Mathews Argyle, MD  Cardiologist: Sinclair Grooms, MD   Chief Complaint  Patient presents with  . Coronary Artery Disease    History of Present Illness:  Daniel Blevins is a 55 y.o. male follow-up of coronary artery disease with bypass surgery 2001, history of abdominal aortic aneurysm treated with EVAR 2015, and subsequent repeat procedures for endoleak.  Since vascular and no procedures he has had significant claudication-like discomfort in the left perineal area with walking. It is slightly improved since it became apparent back in 2016. He still has some limitations due to the discomfort.  He denies chest discomfort. He did have a recent episode of syncope felt to be related to dehydration. He received IV fluid and instrument began felling better. He has had no difficulty since that emergency room visit.    Past Medical History:  Diagnosis Date  . Coronary artery disease   . Deafness in right ear   . Depression   . Dysrhythmia   . 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 History of kidney stones   . Hx of CABG   . Hypertension   . Sleep apnea   . Urinary frequency     Past Surgical History:  Procedure Laterality Date  . ABDOMINAL AORTIC ENDOVASCULAR STENT GRAFT N/A 04/30/2014   Procedure: ABDOMINAL AORTIC ENDOVASCULAR STENT GRAFT;  Surgeon: Elam Dutch, MD;  Location: [facility];  Service: Vascular;  Laterality: N/A;  . ABDOMINAL AORTIC ENDOVASCULAR STENT GRAFT N/A 05/26/2015   Procedure: ABDOMINAL AORTIC ENDOVASCULAR STENT GRAFT RE-INTERVENTION; REPAIR OF PROXIMAL CUFF;  Surgeon: Elam Dutch, MD;  Location: [place];  Service: Vascular;  Laterality: N/A;  . BACK SURGERY  2009   lower back  . CARDIAC CATHETERIZATION    . COLONOSCOPY    . CORONARY ARTERY BYPASS GRAFT  2001  . CYSTOSCOPY WITH RETROGRADE PYELOGRAM, URETEROSCOPY AND STENT PLACEMENT Bilateral  02/07/2014   Procedure: CYSTOSCOPY WITH BILATERAL RETROGRADE PYELOGRAM, BILATERAL URETEROSCOPY, RIGHT EXTRACTION OF STONES AND RIGHT STENT PLACEMENT;  Surgeon: Jorja Loa, MD;  Location[facility];  Service: Urology;  Laterality: Bilateral;  . ESOPHAGOGASTRODUODENOSCOPY    . LAPAROSCOPIC GASTRIC BYPASS  2010   [facility]  . LAPAROSCOPIC LYSIS OF ADHESIONS N/A 03/06/2013   Procedure: LAPAROSCOPIC LYSIS OF ADHESIONS AND CLOSURE OF INTERNAL HERNIAS x2;  Surgeon: Adin Hector, MD;  Location[facility];  Service: General;  Laterality: N/A;  . LAPAROSCOPY N/A 03/06/2013   Procedure: LAPAROSCOPY DIAGNOSTIC;  Surgeon: Adin Hector, MD;  [facility];  Service: General;  Laterality: N/A;  . PERIPHERAL VASCULAR CATHETERIZATION Right 04/30/2014   Procedure: EMBOLIZATION right internal iliac;  Surgeon: Elam Dutch, MD;  Location: [place];  Service: Vascular;  Laterality: Right;    Current Medications: Outpatient Medications Prior to Visit  Medication Sig Dispense Refill  . amLODipine (NORVASC) 10 MG tablet Take 10 mg by mouth daily.    Marland Kitchen aspirin 81 MG tablet Take 81 mg by mouth every morning.     Marland Kitchen KLOR-CON M20 20 MEQ tablet TAKE 2 TABLETS EVERY       MORNING. PLEASE MAKE AN    APPOINTMENT WITH YOUR      DOCTOR 180 tablet 0  . simvastatin (ZOCOR) 20 MG tablet Take 1 tablet (20 mg total) by mouth every morning. 90 tablet 3  . carvedilol (COREG) 25 MG  tablet TAKE 2 TABLETS BY MOUTH EVERY MORNING 180 tablet 0  . lisinopril (PRINIVIL,ZESTRIL) 40 MG tablet Take 1 tablet (40 mg total) by mouth every morning. 90 tablet 0  . buPROPion (WELLBUTRIN XL) 150 MG 24 hr tablet Take 150 mg by mouth daily.     . Ferrous Sulfate (IRON) 325 (65 FE) MG TABS Take 1 tablet by mouth every morning.    . mirabegron ER (MYRBETRIQ) 50 MG TB24 tablet Take 50 mg by mouth every evening.     . ondansetron (ZOFRAN) 4 MG tablet Take 1 tablet (4 mg total) by mouth every 6 (six) hours. (Patient not taking: Reported on  11/06/2015) 12 tablet 0  . vitamin B-12 (CYANOCOBALAMIN) 1000 MCG tablet Take 1,000 mcg by mouth daily.     No facility-administered medications prior to visit.      Allergies:   Review of patient's allergies indicates no known allergies.   Social History   Social History  . Marital status: Married    Spouse name: N/A  . Number of children: N/A  . Years of education: N/A   Social History Main Topics  . Smoking status: Never Smoker  . Smokeless tobacco: Never Used  . Alcohol use No  . Drug use: No  . Sexual activity: No   Other Topics Concern  . None   Social History Narrative  . None     Family History:  The patient's family history includes AAA (abdominal aortic aneurysm) in his father; Cancer in his mother; Heart disease in his brother and father; Lung cancer in his mother.   ROS:   Please see the history of present illness.    Anxiety and difficulty sleeping. Tendency to overwork. He is a Psychologist, occupational.  All other systems reviewed and are negative.   PHYSICAL EXAM:   VS:  BP 112/84   Pulse 65   Ht 5\' 11"  (1.803 m)   Wt 268 lb 1.9 oz (121.6 kg)   BMI 37.40 kg/m    GEN: Well nourished, well developed, in no acute distress  HEENT: normal  Neck: no JVD, carotid bruits, or masses Cardiac: RRR; no murmurs, rubs, or gallops,no edema  Respiratory:  clear to auscultation bilaterally, normal work of breathing GI: soft, nontender, nondistended, + BS MS: no deformity or atrophy  Skin: warm and dry, no rash Neuro:  Alert and Oriented x 3, Strength and sensation are intact Psych: euthymic mood, full affect  Wt Readings from Last 3 Encounters:  01/01/16 268 lb 1.9 oz (121.6 kg)  11/06/15 255 lb (115.7 kg)  08/14/15 255 lb (115.7 kg)      Studies/Labs Reviewed:   EKG:  EKG  Sinus rhythm with normal appearance.  Recent Labs: 05/26/2015: Magnesium 2.0 06/06/2015: ALT 18 10/17/2015: Hemoglobin 12.6; Platelets 126; Potassium 3.2; Sodium 141 10/24/2015: BUN 19; Creat 1.14    Lipid Panel No results found for: CHOL, TRIG, HDL, CHOLHDL, VLDL, LDLCALC, LDLDIRECT  Additional studies/ records that were reviewed today include:  Endovascular procedure in December 2015:  Procedure: Simeon Craft Excluder bifurcated stent graft for repair of bilateral common iliac aneurysm, right internal iliac and infrarenal aortic aneurysm                              Embolization right internal iliac artery  ASSESSMENT:    1. Coronary artery disease involving coronary bypass graft of native heart with angina pectoris with documented spasm ([place])   2. Essential hypertension  3. Hyperlipidemia   4. Endoleak post (EVAR) endovascular aneurysm repair, initial encounter ([place])   5. Near syncope      PLAN:  In order of problems listed above:  1. Stable without recurrent clinical symptoms. He is physically active. He states that there is no unexplained dyspnea or pain. 2. Low salt diet as advocated. Medication compliance is unquestioned. 3. Followed by primary care. 4. Follow-up vascular surgery, Dr. Oneida Alar. 5. No recurrence of syncope and clinically the suspicion of dehydration seems to have been correct.    Medication Adjustments/Labs and Tests Ordered: Current medicines are reviewed at length with the patient today.  Concerns regarding medicines are outlined above.  Medication changes, Labs and Tests ordered today are listed in the Patient Instructions below. Patient Instructions  Medication Instructions:  Your physician recommends that you continue on your current medications as directed. Please refer to the Current Medication list given to you today.  Labwork: None   Testing/Procedures: none  Follow-Up: Your physician wants you to follow-up in: 1 year with Dr Tamala Julian. (August 2018). You will receive a reminder letter in the mail two months in advance. If you don't receive a letter, please call our office to schedule the follow-up appointment.   .     If you need a refill  on your cardiac medications before your next appointment, please call your pharmacy.      Signed, Sinclair Grooms, MD  01/01/2016 10:00 AM    [facility] [place], [place], [place]  [address] Phone: [phone]; Fax: [phone]

## 2016-01-01 NOTE — Patient Instructions (Signed)
Medication Instructions:  Your physician recommends that you continue on your current medications as directed. Please refer to the Current Medication list given to you today.  Labwork: None   Testing/Procedures: none  Follow-Up: Your physician wants you to follow-up in: 1 year with Dr Tamala Julian. (August 2018). You will receive a reminder letter in the mail two months in advance. If you don't receive a letter, please call our office to schedule the follow-up appointment.   .     If you need a refill on your cardiac medications before your next appointment, please call your pharmacy.

## 2016-01-05 ENCOUNTER — Other Ambulatory Visit: Payer: Self-pay | Admitting: Interventional Cardiology

## 2016-01-30 ENCOUNTER — Encounter: Payer: Self-pay | Admitting: Interventional Radiology

## 2016-03-30 ENCOUNTER — Other Ambulatory Visit: Payer: Self-pay | Admitting: Geriatric Medicine

## 2016-03-30 DIAGNOSIS — R1031 Right lower quadrant pain: Secondary | ICD-10-CM

## 2016-04-05 ENCOUNTER — Other Ambulatory Visit: Payer: Managed Care, Other (non HMO)

## 2016-04-13 ENCOUNTER — Ambulatory Visit
Admission: RE | Admit: 2016-04-13 | Discharge: 2016-04-13 | Disposition: A | Discharge status: Home / Self Care | Payer: Managed Care, Other (non HMO) | Source: Ambulatory Visit | Attending: Geriatric Medicine | Admitting: Geriatric Medicine

## 2016-04-13 DIAGNOSIS — R1031 Right lower quadrant pain: Secondary | ICD-10-CM

## 2016-04-13 MED ORDER — IOPAMIDOL (ISOVUE-300) INJECTION 61%
125.0000 mL | Freq: Once | INTRAVENOUS | Status: AC | PRN
  Administered 2016-04-13: 125 mL via INTRAVENOUS

## 2016-04-14 ENCOUNTER — Telehealth: Payer: Self-pay | Admitting: Vascular Surgery

## 2016-04-14 NOTE — Telephone Encounter (Signed)
Attempted to return page to [phone] approximately 8pm.  No answer at this phone number.  I again attempted to call this number at about 815 still with no answer.  I was called by our answering service at 9 pm and asked to call [phone] and again no answer.  I called the answering service back and gave them a message to have Mr Auvil provide a different number as this one does not seem to be working  [name], MD [facility] Office: [phone] Pager: [phone]

## 2016-04-15 ENCOUNTER — Telehealth: Payer: Self-pay

## 2016-04-15 ENCOUNTER — Other Ambulatory Visit (HOSPITAL_COMMUNITY): Payer: Self-pay | Admitting: Interventional Radiology

## 2016-04-15 DIAGNOSIS — R1032 Left lower quadrant pain: Secondary | ICD-10-CM

## 2016-04-15 NOTE — Telephone Encounter (Signed)
Discussed pt's symptoms with Dr. Bridgett Larsson.  Reviewed report of CT Abd./ pelvis of 04/12/16; negative for any endoleak or change in aneurysm sac size.  No new recommendations per Dr. Bridgett Larsson; pt. has appt. 12/19 with IR, for eval. of left groin pain.  Called pt. Back; advised no recommendation per Dr. Bridgett Larsson at this time.  Encouraged to go to the ER if intensity of pain recurs.  Verb. Understanding.

## 2016-04-15 NOTE — Telephone Encounter (Signed)
Called pt. After noting he had called the Answering Service @ approx. 8:00 PM last evening.  Advised the pt. That Dr. Oneida Alar attempted to return call to [phone] x 2, and [phone] x1, without success; above #'s were provided by the Answering Service.  The pt. Stated he was in terrible pain (L) groin last night.  Stated "Dr. Oneida Alar and Dr. Laurence Ferrari is aware of the pain that has been going on since approx. 3 months after my aneurysm repair."  Pt. stated "something is different."  Reported the pain in the left groin goes up into the back and down into the left leg.  Denied any localized redness, swelling, or inflammation in left groin.  Stated he did experience chills last night.  Reported the left groin pain started slowly approx. 5:30-6:00 PM, and then became a lot worse between 7:00-7:30 PM.  Stated the pain was constant, and at 10/10.  Reported he took a pain pill, and it eventually gave him the ability to get some sleep.  Reported he took a pain pill this morning, and now his pain is at 2/10.  Advised will discuss his symptoms with MD in office and return call to pt.  Agreed.

## 2016-04-20 ENCOUNTER — Ambulatory Visit
Admission: RE | Admit: 2016-04-20 | Discharge: 2016-04-20 | Disposition: A | Discharge status: Home / Self Care | Payer: Managed Care, Other (non HMO) | Source: Ambulatory Visit | Attending: Interventional Radiology | Admitting: Interventional Radiology

## 2016-04-20 DIAGNOSIS — R1032 Left lower quadrant pain: Secondary | ICD-10-CM

## 2016-04-20 HISTORY — PX: IR GENERIC HISTORICAL: IMG1180011

## 2016-04-20 NOTE — Progress Notes (Signed)
Chief Complaint: Patient was seen in consultation today for  Chief Complaint  Patient presents with  . Follow-up    Left groin pain x 4 days   at the request of McCullough,Heath  Referring Physician(s): McCullough,Heath  History of Present Illness: Daniel Blevins is a 55 y.o. male with a history of abdominal aortic aneurysm treated by endovascular aortic repair by Dr. [name]. Subsequent type IB and type II endoleak's were then identified and repaired, the first by Dr. fields and the second by myself.   Unfortunately, since the time of his Marylouise Stacks he has experienced intermittent left groin pain. He has undergone workup for this with out identification of an underlying etiology. He presents today as an add on patient. On Saturday, he developed acute exacerbation of his left groin symptoms. He reports that it feels like a Feist-like squeezing with occasional throbbing deep in his left groin radiating into the buttock, and down the posterior thigh. The pain was excruciating on Saturday but has since relieved somewhat although it remains constant and grating.  He denies trauma or overactivity. He denies fever, chills, shortness of breath, left lower extremity swelling, or palpable pulsatile mass. No prior history of hernia. No pain with coughing.  Past Medical History:  Diagnosis Date  . Coronary artery disease   . Deafness in right ear   . Depression   . Dysrhythmia   . 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 History of kidney stones   . Hx of CABG   . Hypertension   . Sleep apnea   . Urinary frequency     Past Surgical History:  Procedure Laterality Date  . ABDOMINAL AORTIC ENDOVASCULAR STENT GRAFT N/A 04/30/2014   Procedure: ABDOMINAL AORTIC ENDOVASCULAR STENT GRAFT;  Surgeon: Elam Dutch, MD;  Location: [facility];  Service: Vascular;  Laterality: N/A;  . ABDOMINAL AORTIC ENDOVASCULAR STENT GRAFT N/A 05/26/2015   Procedure: ABDOMINAL AORTIC ENDOVASCULAR STENT GRAFT  RE-INTERVENTION; REPAIR OF PROXIMAL CUFF;  Surgeon: Elam Dutch, MD;  Location: [place];  Service: Vascular;  Laterality: N/A;  . BACK SURGERY  2009   lower back  . CARDIAC CATHETERIZATION    . COLONOSCOPY    . CORONARY ARTERY BYPASS GRAFT  2001  . CYSTOSCOPY WITH RETROGRADE PYELOGRAM, URETEROSCOPY AND STENT PLACEMENT Bilateral 02/07/2014   Procedure: CYSTOSCOPY WITH BILATERAL RETROGRADE PYELOGRAM, BILATERAL URETEROSCOPY, RIGHT EXTRACTION OF STONES AND RIGHT STENT PLACEMENT;  Surgeon: Jorja Loa, MD;  Location: [facility];  Service: Urology;  Laterality: Bilateral;  . ESOPHAGOGASTRODUODENOSCOPY    . IR GENERIC HISTORICAL  11/06/2015   IR RADIOLOGIST EVAL & MGMT 11/06/2015 Jacqulynn Cadet, MD [facility] INTERV RAD  . LAPAROSCOPIC GASTRIC BYPASS  2010   [facility]  . LAPAROSCOPIC LYSIS OF ADHESIONS N/A 03/06/2013   Procedure: LAPAROSCOPIC LYSIS OF ADHESIONS AND CLOSURE OF INTERNAL HERNIAS x2;  Surgeon: Adin Hector, MD;  Location: [facility];  Service: General;  Laterality: N/A;  . LAPAROSCOPY N/A 03/06/2013   Procedure: LAPAROSCOPY DIAGNOSTIC;  Surgeon: Adin Hector, MD;  Location: [facility];  Service: General;  Laterality: N/A;  . PERIPHERAL VASCULAR CATHETERIZATION Right 04/30/2014   Procedure: EMBOLIZATION right internal iliac;  Surgeon: Elam Dutch, MD;  Location: [place];  Service: Vascular;  Laterality: Right;    Allergies: Patient has no known allergies.  Medications: Prior to Admission medications   Medication Sig Start Date End Date Taking? Authorizing Provider  amLODipine (NORVASC) 10 MG tablet Take 10 mg by mouth daily.  Yes Historical Provider, MD  aspirin 81 MG tablet Take 81 mg by mouth every morning.    Yes Historical Provider, MD  buPROPion (WELLBUTRIN XL) 300 MG 24 hr tablet Take 300 mg by mouth daily. 12/12/15  Yes Historical Provider, MD  carvedilol (COREG) 25 MG tablet TAKE 2 TABLETS BY MOUTH EVERY MORNING 01/01/16  Yes Belva Crome, MD  lisinopril  (PRINIVIL,ZESTRIL) 40 MG tablet Take 1 tablet (40 mg total) by mouth every morning. 01/01/16  Yes Belva Crome, MD  simvastatin (ZOCOR) 20 MG tablet Take 1 tablet (20 mg total) by mouth every morning. 03/14/14  Yes Belva Crome, MD  KLOR-CON M20 20 MEQ tablet TAKE 2 TABLETS EVERY       MORNING. PLEASE MAKE AN    APPOINTMENT WITH YOUR      DOCTOR Patient not taking: Reported on 04/20/2016 09/11/15   Belva Crome, MD  potassium chloride SA (KLOR-CON M20) 20 MEQ tablet Take 2 tablets (40 mEq total) by mouth daily. Patient not taking: Reported on 04/20/2016 01/06/16   Belva Crome, MD     Family History  Problem Relation Age of Onset  . Lung cancer Mother   . Cancer Mother     Lung  . AAA (abdominal aortic aneurysm) Father   . Heart disease Father     before age 50  . Heart disease Brother     before age 80    Social History   Social History  . Marital status: Married    Spouse name: N/A  . Number of children: N/A  . Years of education: N/A   Social History Main Topics  . Smoking status: Never Smoker  . Smokeless tobacco: Never Used  . Alcohol use No  . Drug use: No  . Sexual activity: No   Other Topics Concern  . Not on file   Social History Narrative  . No narrative on file     Review of Systems: A 12 point ROS discussed and pertinent positives are indicated in the HPI above.  All other systems are negative.  Review of Systems  Vital Signs: BP 140/88 (BP Location: Left Arm, Patient Position: Sitting, Cuff Size: Large)   Pulse 75   Temp 98.2 F (36.8 C) (Oral)   Resp 15   Ht 5\' 11"  (1.803 m)   Wt 267 lb (121.1 kg)   SpO2 97%   BMI 37.24 kg/m   Physical Exam  Constitutional: He is oriented to person, place, and time. He appears well-developed and well-nourished. No distress.  HENT:  Head: Normocephalic and atraumatic.  Eyes: No scleral icterus.  Cardiovascular: Normal rate, regular rhythm, intact distal pulses and normal pulses.   Pulses:      Femoral  pulses are 2+ on the left side. Pulmonary/Chest: Effort normal and breath sounds normal.  Abdominal: Soft. He exhibits no distension. There is no tenderness.  Musculoskeletal:       Legs: Positive muscular TTP in the proximal left adductor muscles.    - evaluation for inguinal hernia  - evaluation for piriformis syndrome  Neurological: He is alert and oriented to person, place, and time.  Skin: Skin is dry. No erythema.  Nursing note and vitals reviewed.    Imaging: Ct Abdomen Pelvis W Contrast  Result Date: 04/13/2016 CLINICAL DATA:  Right lower quadrant pain for 1 month, initial encounter EXAM: CT ABDOMEN AND PELVIS WITH CONTRAST TECHNIQUE: Multidetector CT imaging of the abdomen and pelvis was performed using the standard  protocol following bolus administration of intravenous contrast. CONTRAST:  113mL ISOVUE-300 COMPARISON:  11/05/2015 FINDINGS: Lower chest: No acute abnormality. Hepatobiliary: The liver is diffusely decreased in attenuation consistent with fatty infiltration. The gallbladder is unremarkable. Pancreas: Unremarkable. No pancreatic ductal dilatation or surrounding inflammatory changes. Spleen: Normal in size without focal abnormality. Adrenals/Urinary Tract: The adrenal glands are within normal limits. No renal calculi or obstructive changes are noted. Scattered hypodensities are noted consistent with small cysts. Bladder is decompressed. Stomach/Bowel: Changes of prior gastric bypass are seen. Mobile cecum is noted with the tip in the right upper quadrant. No obstructive changes are seen. No acute abnormality noted. Vascular/Lymphatic: Known abdominal aortic aneurysm is again seen. The sac diameter measures approximately 6.6 x 6.8 cm utilizing similar technique that seen on the prior exam. This is stable in appearance. No findings to suggest perfusion of the aneurysm sac are seen. Changes of prior embolus therapy involving the IMA, lumbar artery use and aneurysm sac are again  seen. Embolus therapy of the right internal iliac artery is also noted. 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 No significant lymphadenopathy is noted. Reproductive: Prostate is unremarkable. Other: No abdominal wall hernia or abnormality. No abdominopelvic ascites. Musculoskeletal: No acute or significant osseous findings. IMPRESSION: Known abdominal aortic aneurysm with treatment for prior endoleaks. No findings to suggest endoleak are noted at this time. The aneurysm sac is stable in size when compared with the previous exam. Chronic changes as described above. No acute abnormality noted. Electronically Signed   By: Inez Catalina M.D.   On: 04/13/2016 09:39    Labs:  CBC:  Recent Labs  05/27/15 0400 06/06/15 1425 07/17/15 0748 10/17/15 1758 10/17/15 1806  WBC 4.5 4.2 3.6* 4.3  --   HGB 10.1* 11.3* 11.7* 11.0* 12.6*  HCT 31.6* 35.0* 36.2* 34.1* 37.0*  PLT 114* 179 140* 126*  --     COAGS:  Recent Labs  05/20/15 0640 05/22/15 1501 05/26/15 1000 07/17/15 0748  INR 1.19 1.20 1.36 1.20  APTT 28 39* 31 30    BMP:  Recent Labs  05/26/15 1000 05/27/15 0400 06/06/15 1425 07/17/15 0748 10/17/15 1806 10/24/15 1144  NA 144 141 144 144 141  --   K 3.4* 3.2* 3.7 3.2* 3.2*  --   CL 107 104  --  105 101  --   CO2 29 28 26 28   --   --   GLUCOSE 94 162* 66* 117* 115*  --   BUN 12 13 14.6 11 15 19   CALCIUM 7.9* 7.9* 8.6 8.4*  --   --   CREATININE 1.27* 1.19 1.3 1.18 1.00 1.14  GFRNONAA >60 >60  --  >60  --  73  GFRAA >60 >60  --  >60  --  84    LIVER FUNCTION TESTS:  Recent Labs  05/22/15 1501 06/06/15 1425  BILITOT 0.4 <0.30  AST 21 17  ALT 20 18  ALKPHOS 59 70  PROT 6.9 7.6  6.9  ALBUMIN 3.4* 3.4*    TUMOR MARKERS: No results for input(s): AFPTM, CEA, CA199, CHROMGRNA in the last 8760 hours.  Assessment and Plan:  Mr. Haith presents with an acute exacerbation of his chronic left groin/medial thigh pain. He has experienced  issues in this region ever since his endovascular aortic repair. Despite a fairly thorough workup, we have not yet found a cause for this pain.  On my physical examination today it appears that he is having some spasm  related to his abductor musculature.  1.) Robaxin 1500 mg up to 3 times daily as needed for pain. I issued him a prescription for 30 tablets, 0 refills. This should help break the current muscle spasm.  2.) Referral to physical therapy for further evaluation and management. I gave him the name of a local therapist to call and schedule an appointment. If he is unsuccessful or runs into problems he is to call the office and we will help arrange a different referral.   Electronically Signed: Laurence Ferrari, [place] 04/20/2016, 5:03 PM   I spent a total of 15 Minutes in face to face in clinical consultation, greater than 50% of which was counseling/coordinating care for left thigh pain

## 2016-04-29 ENCOUNTER — Ambulatory Visit (INDEPENDENT_AMBULATORY_CARE_PROVIDER_SITE_OTHER): Payer: Managed Care, Other (non HMO) | Admitting: Orthopaedic Surgery

## 2016-04-29 DIAGNOSIS — G8929 Other chronic pain: Secondary | ICD-10-CM | POA: Diagnosis not present

## 2016-04-29 DIAGNOSIS — M25511 Pain in right shoulder: Secondary | ICD-10-CM

## 2016-04-29 MED ORDER — LIDOCAINE HCL 1 % IJ SOLN
3.0000 mL | INTRAMUSCULAR | Status: AC | PRN
  Administered 2016-04-29: 3 mL

## 2016-04-29 MED ORDER — METHYLPREDNISOLONE ACETATE 40 MG/ML IJ SUSP
40.0000 mg | INTRAMUSCULAR | Status: AC | PRN
  Administered 2016-04-29: 40 mg via INTRA_ARTICULAR

## 2016-04-29 MED ORDER — HYDROCODONE-ACETAMINOPHEN 5-325 MG PO TABS: 1.0000 | ORAL_TABLET | Freq: Two times a day (BID) | ORAL | 0 refills | Status: DC | PRN

## 2016-04-29 NOTE — Progress Notes (Signed)
Office Visit Note   Patient: Daniel Blevins           Date of Birth: Sep 06, 1960           MRN: [id number] Visit Date: 04/29/2016              Requested by: Lajean Manes, MD [address] [place], [place] [address] PCP: Mathews Argyle, MD   Assessment & Plan: Visit Diagnoses:  1. Chronic right shoulder pain     Plan: I'm continuing to treat this is shoulder impingement syndrome. He tolerated the subacromial injection well located the last time. He is still may consider surgery in the future if this doesn't clear things up. He'll try anti-inflammatories as needed and slowly increase his activities as needed. If he gets with injections not helping I do think surgery is a good option in terms of arthroscopic intervention of the shoulder.  Follow-Up Instructions: Return if symptoms worsen or fail to improve.   Orders:  Orders Placed This Encounter  Procedures  . Large Joint Injection/Arthrocentesis   Meds ordered this encounter  Medications  . HYDROcodone-acetaminophen (NORCO/VICODIN) 5-325 MG tablet    Sig: Take 1-2 tablets by mouth 2 (two) times daily as needed for moderate pain.    Dispense:  60 tablet    Refill:  0      Procedures: Large Joint Inj Date/Time: 04/29/2016 2:04 PM Performed by: Mcarthur Rossetti Authorized by: Mcarthur Rossetti   Location:  Shoulder Site:  R subacromial bursa Ultrasound Guidance: No   Fluoroscopic Guidance: No   Arthrogram: No   Medications:  3 mL lidocaine 1 %; 40 mg methylPREDNISolone acetate 40 MG/ML     Clinical Data: No additional findings.   Subjective: No chief complaint on file. The patient is well-known to me. 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 He's tolerated an injection before that shoulder and he like to have another one today. It's been along times since that injection. We had recommended arthroscopic surgery before with subacromial  decompression but he still wants to hold off on this. He will still see if another injection will help and I agree with this. He denies any recent injuries to the shoulder but he says it does hurt with overhead activities and reaching behind him. He says is worse at night as well. Is waking him up. He can be 10 out of 10 at times.  HPI  Review of Systems He denies any chest pain, headache, shortness of breath, fever, chills, nausea, vomiting.  Objective: Vital Signs: There were no vitals taken for this visit.  Physical Exam  Ortho Exam His right shoulder moves smoothly but it is painful. His rotator cuff itself appears to be intact. He has positive Neer and Hawkins signs. His most painful motion is internal rotation with adduction. Specialty Comments:  No specialty comments available.  Imaging: No results found.   PMFS History: Patient Active Problem List   Diagnosis Date Noted  . Near syncope 12/31/2015  . Anemia 06/05/2015  . Thrombocytopenia ([place]) 06/05/2015  . Endoleak post (EVAR) endovascular aneurysm repair ([place])   . Left groin pain 11/07/2014  . Left thigh pain 09/11/2014  . CAD (coronary artery disease) of artery bypass graft 03/14/2014  . Kidney stones 03/14/2014  . Hyperlipidemia 03/14/2014  . Essential hypertension 03/14/2014  . SBO (small bowel obstruction) 03/06/2013  . History of gastric bypass 03/06/2013   Past Medical History:  Diagnosis Date  .  Coronary artery disease   . Deafness in right ear   . Depression   . Dysrhythmia   . 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 History of kidney stones   . Hx of CABG   . Hypertension   . Sleep apnea   . Urinary frequency     Family History  Problem Relation Age of Onset  . Lung cancer Mother   . Cancer Mother     Lung  . AAA (abdominal aortic aneurysm) Father   . Heart disease Father     before age 61  . Heart disease Brother     before age 39    Past Surgical History:  Procedure Laterality Date  .  ABDOMINAL AORTIC ENDOVASCULAR STENT GRAFT N/A 04/30/2014   Procedure: ABDOMINAL AORTIC ENDOVASCULAR STENT GRAFT;  Surgeon: Elam Dutch, MD;  Location: [facility] OR;  Service: Vascular;  Laterality: N/A;  . ABDOMINAL AORTIC ENDOVASCULAR STENT GRAFT N/A 05/26/2015   Procedure: ABDOMINAL AORTIC ENDOVASCULAR STENT GRAFT RE-INTERVENTION; REPAIR OF PROXIMAL CUFF;  Surgeon: Elam Dutch, MD;  Location: [place];  Service: Vascular;  Laterality: N/A;  . BACK SURGERY  2009   lower back  . CARDIAC CATHETERIZATION    . COLONOSCOPY    . CORONARY ARTERY BYPASS GRAFT  2001  . CYSTOSCOPY WITH RETROGRADE PYELOGRAM, URETEROSCOPY AND STENT PLACEMENT Bilateral 02/07/2014   Procedure: CYSTOSCOPY WITH BILATERAL RETROGRADE PYELOGRAM, BILATERAL URETEROSCOPY, RIGHT EXTRACTION OF STONES AND RIGHT STENT PLACEMENT;  Surgeon: Jorja Loa, MD;  [facility];  Service: Urology;  Laterality: Bilateral;  . ESOPHAGOGASTRODUODENOSCOPY    . IR GENERIC HISTORICAL  11/06/2015   IR RADIOLOGIST EVAL & MGMT 11/06/2015 Jacqulynn Cadet, MD [facility] INTERV RAD  . LAPAROSCOPIC GASTRIC BYPASS  2010   [facility]  . LAPAROSCOPIC LYSIS OF ADHESIONS N/A 03/06/2013   Procedure: LAPAROSCOPIC LYSIS OF ADHESIONS AND CLOSURE OF INTERNAL HERNIAS x2;  Surgeon: Adin Hector, MD;  [facility];  Service: General;  Laterality: N/A;  . LAPAROSCOPY N/A 03/06/2013   Procedure: LAPAROSCOPY DIAGNOSTIC;  Surgeon: Adin Hector, MD;  [facility];  Service: General;  Laterality: N/A;  . PERIPHERAL VASCULAR CATHETERIZATION Right 04/30/2014   Procedure: EMBOLIZATION right internal iliac;  Surgeon: Elam Dutch, MD;  Location: [place];  Service: Vascular;  Laterality: Right;   Social History   Occupational History  . Not on file.   Social History Main Topics  . Smoking status: Never Smoker  . Smokeless tobacco: Never Used  . Alcohol use No  . Drug use: No  . Sexual activity: No

## 2016-05-05 ENCOUNTER — Ambulatory Visit (INDEPENDENT_AMBULATORY_CARE_PROVIDER_SITE_OTHER): Payer: Self-pay | Admitting: Orthopaedic Surgery

## 2016-05-07 ENCOUNTER — Encounter: Payer: Self-pay | Admitting: Interventional Radiology

## 2016-05-25 ENCOUNTER — Other Ambulatory Visit (HOSPITAL_COMMUNITY): Payer: Self-pay | Admitting: Interventional Radiology

## 2016-05-25 ENCOUNTER — Other Ambulatory Visit: Payer: Self-pay | Admitting: Radiology

## 2016-05-25 DIAGNOSIS — IMO0001 Reserved for inherently not codable concepts without codable children: Secondary | ICD-10-CM

## 2016-05-25 DIAGNOSIS — M62838 Other muscle spasm: Secondary | ICD-10-CM

## 2016-05-25 DIAGNOSIS — T82330D Leakage of aortic (bifurcation) graft (replacement), subsequent encounter: Secondary | ICD-10-CM

## 2016-05-25 MED ORDER — METHOCARBAMOL 750 MG PO TABS: 1500.0000 mg | ORAL_TABLET | Freq: Three times a day (TID) | ORAL | 0 refills | Status: DC | PRN

## 2016-05-25 NOTE — Progress Notes (Signed)
Phoned patient per Dr Laurence Ferrari:  CT Angio Abd/Pelv of 04/13/2017 looks good.  Follow up in 1 yr w/ repeat CT Angio Abd/Pelv.  Robaxin refilled x 1 (1500 mg po Q8hrsas needed for recurrent muscle spasm).  Patient has not yet made an appointment with PT.  No additional refills.  Cailie Bosshart Riki Rusk, RN 05/25/2016 11:24 AM

## 2016-06-17 ENCOUNTER — Inpatient Hospital Stay (INDEPENDENT_AMBULATORY_CARE_PROVIDER_SITE_OTHER): Payer: Managed Care, Other (non HMO) | Admitting: Orthopaedic Surgery

## 2016-07-23 ENCOUNTER — Other Ambulatory Visit: Payer: Self-pay | Admitting: Interventional Cardiology

## 2016-07-23 NOTE — Telephone Encounter (Signed)
Medication Detail    Disp Refills Start End   lisinopril (PRINIVIL,ZESTRIL) 40 MG tablet 90 tablet 3 01/01/2016    Sig - Route: Take 1 tablet (40 mg total) by mouth every morning. - Oral   E-Prescribing Status: Receipt confirmed by pharmacy (01/01/2016 9:57 AM EDT)   Associated Diagnoses   Coronary artery disease involving coronary bypass graft of native heart with angina pectoris with documented spasm ([place]) - Primary     Essential hypertension     Hyperlipidemia     Endoleak post (EVAR) endovascular aneurysm repair, initial encounter ([place])     Near syncope     [place], [place]

## 2016-07-28 ENCOUNTER — Inpatient Hospital Stay (INDEPENDENT_AMBULATORY_CARE_PROVIDER_SITE_OTHER): Payer: Managed Care, Other (non HMO) | Admitting: Orthopaedic Surgery

## 2016-11-25 ENCOUNTER — Encounter (HOSPITAL_COMMUNITY): Payer: Self-pay | Admitting: Emergency Medicine

## 2016-11-25 ENCOUNTER — Observation Stay (HOSPITAL_COMMUNITY)
Admission: EM | Admit: 2016-11-25 | Discharge: 2016-11-27 | Disposition: A | Discharge status: Home / Self Care | Payer: 59 | Attending: Internal Medicine | Admitting: Internal Medicine

## 2016-11-25 ENCOUNTER — Emergency Department (HOSPITAL_COMMUNITY): Payer: 59

## 2016-11-25 DIAGNOSIS — I129 Hypertensive chronic kidney disease with stage 1 through stage 4 chronic kidney disease, or unspecified chronic kidney disease: Secondary | ICD-10-CM | POA: Insufficient documentation

## 2016-11-25 DIAGNOSIS — N182 Chronic kidney disease, stage 2 (mild): Secondary | ICD-10-CM | POA: Diagnosis not present

## 2016-11-25 DIAGNOSIS — E785 Hyperlipidemia, unspecified: Secondary | ICD-10-CM | POA: Diagnosis not present

## 2016-11-25 DIAGNOSIS — E669 Obesity, unspecified: Secondary | ICD-10-CM | POA: Diagnosis not present

## 2016-11-25 DIAGNOSIS — I252 Old myocardial infarction: Secondary | ICD-10-CM | POA: Diagnosis not present

## 2016-11-25 DIAGNOSIS — R072 Precordial pain: Principal | ICD-10-CM | POA: Insufficient documentation

## 2016-11-25 DIAGNOSIS — I10 Essential (primary) hypertension: Secondary | ICD-10-CM | POA: Diagnosis not present

## 2016-11-25 DIAGNOSIS — N189 Chronic kidney disease, unspecified: Secondary | ICD-10-CM

## 2016-11-25 DIAGNOSIS — G4733 Obstructive sleep apnea (adult) (pediatric): Secondary | ICD-10-CM | POA: Diagnosis not present

## 2016-11-25 DIAGNOSIS — T82330A Leakage of aortic (bifurcation) graft (replacement), initial encounter: Secondary | ICD-10-CM

## 2016-11-25 DIAGNOSIS — R55 Syncope and collapse: Secondary | ICD-10-CM

## 2016-11-25 DIAGNOSIS — Z7982 Long term (current) use of aspirin: Secondary | ICD-10-CM | POA: Diagnosis not present

## 2016-11-25 DIAGNOSIS — D61818 Other pancytopenia: Secondary | ICD-10-CM | POA: Insufficient documentation

## 2016-11-25 DIAGNOSIS — R079 Chest pain, unspecified: Secondary | ICD-10-CM | POA: Diagnosis not present

## 2016-11-25 DIAGNOSIS — E538 Deficiency of other specified B group vitamins: Secondary | ICD-10-CM | POA: Diagnosis not present

## 2016-11-25 DIAGNOSIS — Z79899 Other long term (current) drug therapy: Secondary | ICD-10-CM | POA: Diagnosis not present

## 2016-11-25 DIAGNOSIS — Z8679 Personal history of other diseases of the circulatory system: Secondary | ICD-10-CM | POA: Diagnosis not present

## 2016-11-25 DIAGNOSIS — IMO0002 Reserved for concepts with insufficient information to code with codable children: Secondary | ICD-10-CM | POA: Diagnosis present

## 2016-11-25 DIAGNOSIS — I25810 Atherosclerosis of coronary artery bypass graft(s) without angina pectoris: Secondary | ICD-10-CM | POA: Diagnosis present

## 2016-11-25 DIAGNOSIS — Z951 Presence of aortocoronary bypass graft: Secondary | ICD-10-CM | POA: Diagnosis not present

## 2016-11-25 DIAGNOSIS — N179 Acute kidney failure, unspecified: Secondary | ICD-10-CM | POA: Diagnosis not present

## 2016-11-25 DIAGNOSIS — D649 Anemia, unspecified: Secondary | ICD-10-CM | POA: Diagnosis present

## 2016-11-25 DIAGNOSIS — Z9884 Bariatric surgery status: Secondary | ICD-10-CM | POA: Diagnosis not present

## 2016-11-25 DIAGNOSIS — F329 Major depressive disorder, single episode, unspecified: Secondary | ICD-10-CM | POA: Insufficient documentation

## 2016-11-25 DIAGNOSIS — Z87442 Personal history of urinary calculi: Secondary | ICD-10-CM | POA: Diagnosis not present

## 2016-11-25 DIAGNOSIS — Z6836 Body mass index (BMI) 36.0-36.9, adult: Secondary | ICD-10-CM | POA: Diagnosis not present

## 2016-11-25 DIAGNOSIS — I2510 Atherosclerotic heart disease of native coronary artery without angina pectoris: Secondary | ICD-10-CM

## 2016-11-25 DIAGNOSIS — I25701 Atherosclerosis of coronary artery bypass graft(s), unspecified, with angina pectoris with documented spasm: Secondary | ICD-10-CM

## 2016-11-25 DIAGNOSIS — IMO0001 Reserved for inherently not codable concepts without codable children: Secondary | ICD-10-CM

## 2016-11-25 HISTORY — DX: Acute kidney failure, unspecified: N17.9

## 2016-11-25 HISTORY — DX: Chest pain, unspecified: R07.9

## 2016-11-25 LAB — CBC
HEMATOCRIT: 28.6 % — AB (ref 39.0–52.0)
Hemoglobin: 9.6 g/dL — ABNORMAL LOW (ref 13.0–17.0)
MCH: 26.4 pg (ref 26.0–34.0)
MCHC: 33.6 g/dL (ref 30.0–36.0)
MCV: 78.8 fL (ref 78.0–100.0)
Platelets: 137 10*3/uL — ABNORMAL LOW (ref 150–400)
RBC: 3.63 MIL/uL — AB (ref 4.22–5.81)
RDW: 14.2 % (ref 11.5–15.5)
WBC: 4.1 10*3/uL (ref 4.0–10.5)

## 2016-11-25 LAB — BASIC METABOLIC PANEL
ANION GAP: 9 (ref 5–15)
BUN: 12 mg/dL (ref 6–20)
CHLORIDE: 104 mmol/L (ref 101–111)
CO2: 23 mmol/L (ref 22–32)
Calcium: 8.6 mg/dL — ABNORMAL LOW (ref 8.9–10.3)
Creatinine, Ser: 1.27 mg/dL — ABNORMAL HIGH (ref 0.61–1.24)
Glucose, Bld: 123 mg/dL — ABNORMAL HIGH (ref 65–99)
POTASSIUM: 4.3 mmol/L (ref 3.5–5.1)
SODIUM: 136 mmol/L (ref 135–145)

## 2016-11-25 LAB — LIPID PANEL
Cholesterol: 204 mg/dL — ABNORMAL HIGH (ref 0–200)
HDL: 74 mg/dL (ref >40)
LDL Cholesterol: 116 mg/dL — ABNORMAL HIGH (ref 0–99)
Total CHOL/HDL Ratio: 2.8 RATIO
Triglycerides: 68 mg/dL (ref <150)
VLDL: 14 mg/dL (ref 0–40)

## 2016-11-25 LAB — FOLATE: FOLATE: 7.9 ng/mL (ref >5.9)

## 2016-11-25 LAB — PROTIME-INR
INR: 1.1
PROTHROMBIN TIME: 14.3 s (ref 11.4–15.2)

## 2016-11-25 LAB — FERRITIN: Ferritin: 214 ng/mL (ref 24–336)

## 2016-11-25 LAB — TROPONIN I

## 2016-11-25 LAB — I-STAT TROPONIN, ED: Troponin i, poc: 0 ng/mL (ref 0.00–0.08)

## 2016-11-25 LAB — RETICULOCYTES
RBC.: 3.79 MIL/uL — AB (ref 4.22–5.81)
RETIC COUNT ABSOLUTE: 49.3 10*3/uL (ref 19.0–186.0)
Retic Ct Pct: 1.3 % (ref 0.4–3.1)

## 2016-11-25 LAB — VITAMIN B12: Vitamin B-12: 204 pg/mL (ref 180–914)

## 2016-11-25 LAB — IRON AND TIBC
Iron: 113 ug/dL (ref 45–182)
Saturation Ratios: 33 % (ref 17.9–39.5)
TIBC: 347 ug/dL (ref 250–450)
UIBC: 234 ug/dL

## 2016-11-25 MED ORDER — GI COCKTAIL ~~LOC~~
30.0000 mL | Freq: Once | ORAL | Status: AC
  Administered 2016-11-25: 30 mL via ORAL
  Filled 2016-11-25: qty 30

## 2016-11-25 MED ORDER — METHOCARBAMOL 500 MG PO TABS: 1500.0000 mg | ORAL_TABLET | Freq: Three times a day (TID) | ORAL | Status: DC | PRN

## 2016-11-25 MED ORDER — TRAZODONE HCL 100 MG PO TABS
100.0000 mg | ORAL_TABLET | Freq: Every evening | ORAL | Status: DC | PRN
  Administered 2016-11-25 – 2016-11-26 (×2): 100 mg via ORAL
  Filled 2016-11-25 (×2): qty 1

## 2016-11-25 MED ORDER — BUPROPION HCL ER (XL) 150 MG PO TB24
300.0000 mg | ORAL_TABLET | Freq: Every day | ORAL | Status: DC
  Administered 2016-11-25 – 2016-11-27 (×3): 300 mg via ORAL
  Filled 2016-11-25 (×4): qty 2

## 2016-11-25 MED ORDER — GI COCKTAIL ~~LOC~~: 30.0000 mL | Freq: Four times a day (QID) | ORAL | Status: DC | PRN

## 2016-11-25 MED ORDER — ACETAMINOPHEN 325 MG PO TABS
650.0000 mg | ORAL_TABLET | ORAL | Status: DC | PRN
  Administered 2016-11-25: 650 mg via ORAL
  Filled 2016-11-25: qty 2

## 2016-11-25 MED ORDER — MORPHINE SULFATE (PF) 4 MG/ML IV SOLN: 2.0000 mg | INTRAVENOUS | Status: DC | PRN

## 2016-11-25 MED ORDER — SIMVASTATIN 20 MG PO TABS
20.0000 mg | ORAL_TABLET | Freq: Every day | ORAL | Status: DC
  Administered 2016-11-25 – 2016-11-26 (×2): 20 mg via ORAL
  Filled 2016-11-25 (×2): qty 1

## 2016-11-25 MED ORDER — HYDROCODONE-ACETAMINOPHEN 5-325 MG PO TABS: 1.0000 | ORAL_TABLET | Freq: Two times a day (BID) | ORAL | Status: DC | PRN

## 2016-11-25 MED ORDER — AMLODIPINE BESYLATE 5 MG PO TABS
10.0000 mg | ORAL_TABLET | Freq: Every day | ORAL | Status: DC
  Administered 2016-11-25 – 2016-11-27 (×3): 10 mg via ORAL
  Filled 2016-11-25 (×3): qty 2

## 2016-11-25 MED ORDER — ENOXAPARIN SODIUM 40 MG/0.4ML ~~LOC~~ SOLN
40.0000 mg | SUBCUTANEOUS | Status: DC
Start: 2016-11-25 — End: 2016-11-25

## 2016-11-25 MED ORDER — SODIUM CHLORIDE 0.9 % IV SOLN
INTRAVENOUS | Status: AC
  Administered 2016-11-25: 22:00:00 via INTRAVENOUS

## 2016-11-25 MED ORDER — CARVEDILOL 12.5 MG PO TABS
25.0000 mg | ORAL_TABLET | Freq: Two times a day (BID) | ORAL | Status: DC
  Administered 2016-11-25 – 2016-11-27 (×3): 25 mg via ORAL
  Filled 2016-11-25 (×3): qty 2

## 2016-11-25 MED ORDER — ASPIRIN EC 325 MG PO TBEC
325.0000 mg | DELAYED_RELEASE_TABLET | Freq: Every day | ORAL | Status: DC
  Administered 2016-11-26 – 2016-11-27 (×2): 325 mg via ORAL
  Filled 2016-11-25 (×2): qty 1

## 2016-11-25 MED ORDER — ONDANSETRON HCL 4 MG/2ML IJ SOLN: 4.0000 mg | Freq: Four times a day (QID) | INTRAMUSCULAR | Status: DC | PRN

## 2016-11-25 NOTE — ED Provider Notes (Signed)
[facility] DEPT Provider Note   CSN: [id number] Arrival date & time: 11/25/16  1035     History   Chief Complaint Chief Complaint  Patient presents with  . Chest Pain    HPI Daniel Blevins is a 56 y.o. male who presents with chest pain. PMH significant for CAD s/p CABG, HTN, HLD, s/p gastric bypass, s/p AAA repair. He states the was walking this morning and about 9:30AM he had an acute onset of substernal chest "squeezing" and SOB. He has not had this before and does not feel like prior MI. He received ASA and nitro and has almost complete resolution of pain. No fever, diaphoresis, lightheadedness, palpitations, leg swelling, cough, N/V. He has some RLQ abdominal pain but this has been present for months and he has been referred to GI about this. His cardiologist is Dr. Tamala Julian and he has an upcoming appt in August.   HPI  Past Medical History:  Diagnosis Date  . Coronary artery disease   . Deafness in right ear   . Depression   . Dysrhythmia   . 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 History of kidney stones   . Hx of CABG   . Hypertension   . Sleep apnea   . Urinary frequency     Patient Active Problem List   Diagnosis Date Noted  . Near syncope 12/31/2015  . Anemia 06/05/2015  . Thrombocytopenia ([place]) 06/05/2015  . Endoleak post (EVAR) endovascular aneurysm repair ([place])   . Left groin pain 11/07/2014  . Left thigh pain 09/11/2014  . CAD (coronary artery disease) of artery bypass graft 03/14/2014  . Kidney stones 03/14/2014  . Hyperlipidemia 03/14/2014  . Essential hypertension 03/14/2014  . SBO (small bowel obstruction) ([place]) 03/06/2013  . History of gastric bypass 03/06/2013    Past Surgical History:  Procedure Laterality Date  . ABDOMINAL AORTIC ENDOVASCULAR STENT GRAFT N/A 04/30/2014   Procedure: ABDOMINAL AORTIC ENDOVASCULAR STENT GRAFT;  Surgeon: Elam Dutch, MD;  [facility] OR;  Service: Vascular;  Laterality: N/A;  . ABDOMINAL AORTIC ENDOVASCULAR  STENT GRAFT N/A 05/26/2015   Procedure: ABDOMINAL AORTIC ENDOVASCULAR STENT GRAFT RE-INTERVENTION; REPAIR OF PROXIMAL CUFF;  Surgeon: Elam Dutch, MD;  Location: [facility];  Service: Vascular;  Laterality: N/A;  . BACK SURGERY  2009   lower back  . CARDIAC CATHETERIZATION    . COLONOSCOPY    . CORONARY ARTERY BYPASS GRAFT  2001  . CYSTOSCOPY WITH RETROGRADE PYELOGRAM, URETEROSCOPY AND STENT PLACEMENT Bilateral 02/07/2014   Procedure: CYSTOSCOPY WITH BILATERAL RETROGRADE PYELOGRAM, BILATERAL URETEROSCOPY, RIGHT EXTRACTION OF STONES AND RIGHT STENT PLACEMENT;  Surgeon: Jorja Loa, MD;  Location: [facility];  Service: Urology;  Laterality: Bilateral;  . ESOPHAGOGASTRODUODENOSCOPY    . IR GENERIC HISTORICAL  11/06/2015   IR RADIOLOGIST EVAL & MGMT 11/06/2015 Jacqulynn Cadet, MD [facility] INTERV RAD  . IR GENERIC HISTORICAL  04/20/2016   IR RADIOLOGIST EVAL & MGMT 04/20/2016 Jacqulynn Cadet, MD [facility] INTERV RAD  . LAPAROSCOPIC GASTRIC BYPASS  2010   [facility]  . LAPAROSCOPIC LYSIS OF ADHESIONS N/A 03/06/2013   Procedure: LAPAROSCOPIC LYSIS OF ADHESIONS AND CLOSURE OF INTERNAL HERNIAS x2;  Surgeon: Adin Hector, MD;  Location: [facility];  Service: General;  Laterality: N/A;  . LAPAROSCOPY N/A 03/06/2013   Procedure: LAPAROSCOPY DIAGNOSTIC;  Surgeon: Adin Hector, MD;  Location: [facility];  Service: General;  Laterality: N/A;  . PERIPHERAL VASCULAR CATHETERIZATION Right 04/30/2014   Procedure: EMBOLIZATION right internal iliac;  Surgeon: Elam Dutch, MD;  Location: [place];  Service: Vascular;  Laterality: Right;       Home Medications    Prior to Admission medications   Medication Sig Start Date End Date Taking? Authorizing Provider  amLODipine (NORVASC) 10 MG tablet Take 10 mg by mouth daily.    [provider]  aspirin 81 MG tablet Take 81 mg by mouth every morning.     [provider]  buPROPion (WELLBUTRIN XL) 300 MG 24 hr tablet Take 300 mg by mouth daily.  12/12/15   [provider]  carvedilol (COREG) 25 MG tablet TAKE 2 TABLETS BY MOUTH EVERY MORNING 01/01/16   Belva Crome, MD  HYDROcodone-acetaminophen (NORCO/VICODIN) 5-325 MG tablet Take 1-2 tablets by mouth 2 (two) times daily as needed for moderate pain. 04/29/16   Mcarthur Rossetti, MD  KLOR-CON M20 20 MEQ tablet TAKE 2 TABLETS EVERY       MORNING. PLEASE MAKE AN    APPOINTMENT WITH YOUR      DOCTOR Patient not taking: Reported on 04/20/2016 09/11/15   Belva Crome, MD  lisinopril (PRINIVIL,ZESTRIL) 40 MG tablet Take 1 tablet (40 mg total) by mouth every morning. 01/01/16   Belva Crome, MD  methocarbamol (ROBAXIN-750) 750 MG tablet Take 2 tablets (1,500 mg total) by mouth every 8 (eight) hours as needed for muscle spasms. 05/25/16   Jacqulynn Cadet, MD  potassium chloride SA (KLOR-CON M20) 20 MEQ tablet Take 2 tablets (40 mEq total) by mouth daily. Patient not taking: Reported on 04/20/2016 01/06/16   Belva Crome, MD  simvastatin (ZOCOR) 20 MG tablet Take 1 tablet (20 mg total) by mouth every morning. 03/14/14   Belva Crome, MD    Family History Family History  Problem Relation Age of Onset  . Lung cancer Mother   . Cancer Mother        Lung  . AAA (abdominal aortic aneurysm) Father   . Heart disease Father        before age 70  . Heart disease Brother        before age 82    Social History Social History  Substance Use Topics  . Smoking status: Never Smoker  . Smokeless tobacco: Never Used  . Alcohol use Yes     Allergies   Patient has no known allergies.   Review of Systems Review of Systems  Constitutional: Negative for diaphoresis and fever.  Respiratory: Positive for shortness of breath. Negative for cough.   Cardiovascular: Positive for chest pain. Negative for palpitations and leg swelling.  Gastrointestinal: Positive for abdominal pain (chronic). Negative for nausea and vomiting.  Neurological: Negative for syncope and light-headedness.   All other systems reviewed and are negative.    Physical Exam Updated Vital Signs BP 130/82   Pulse (!) 58   Temp 97.9 F (36.6 C) (Oral)   Resp (!) 22   Ht 5\' 11"  (1.803 m)   Wt 119.3 kg (263 lb)   SpO2 100%   BMI 36.68 kg/m   Physical Exam  Constitutional: He is oriented to person, place, and time. He appears well-developed and well-nourished. No distress.  Obese, NAD  HENT:  Head: Normocephalic and atraumatic.  Eyes: Pupils are equal, round, and reactive to light. Conjunctivae are normal. Right eye exhibits no discharge. Left eye exhibits no discharge. No scleral icterus.  Neck: Normal range of motion.  Cardiovascular: Normal rate and regular rhythm.  Exam reveals no gallop and  no friction rub.   No murmur heard. Pulmonary/Chest: Effort normal and breath sounds normal. No respiratory distress. He has no wheezes. He has no rales. He exhibits no tenderness.  Abdominal: Soft. Bowel sounds are normal. He exhibits no distension and no mass. There is no tenderness. There is no rebound and no guarding. No hernia.  Musculoskeletal:  Trace bilateral ankle edema. Prior surgical scars from bypass graft on bilateral ankles.  Neurological: He is alert and oriented to person, place, and time.  Skin: Skin is warm and dry.  Psychiatric: He has a normal mood and affect. His behavior is normal.  Nursing note and vitals reviewed.    ED Treatments / Results  Labs (all labs ordered are listed, but only abnormal results are displayed) Labs Reviewed  BASIC METABOLIC PANEL - Abnormal; Notable for the following:       Result Value   Glucose, Bld 123 (*)    Creatinine, Ser 1.27 (*)    Calcium 8.6 (*)    All other components within normal limits  CBC - Abnormal; Notable for the following:    RBC 3.63 (*)    Hemoglobin 9.6 (*)    HCT 28.6 (*)    Platelets 137 (*)    All other components within normal limits  RETICULOCYTES - Abnormal; Notable for the following:    RBC. 3.79 (*)    All  other components within normal limits  PROTIME-INR  HIV ANTIBODY (ROUTINE TESTING)  TROPONIN I  TROPONIN I  TROPONIN I  OCCULT BLOOD X 1 CARD TO LAB, STOOL  VITAMIN B12  FOLATE  IRON AND TIBC  FERRITIN  LIPID PANEL  I-STAT TROPONIN, ED    EKG  EKG Interpretation None       Radiology Dg Chest 2 View  Result Date: 11/25/2016 CLINICAL DATA:  Chest pain, shortness of Breath EXAM: CHEST  2 VIEW COMPARISON:  05/26/2015 FINDINGS: Prior CABG. Heart is borderline in size. Mild vascular congestion. No confluent opacities or effusions. No acute bony abnormality. IMPRESSION: Borderline heart size.  Vascular congestion. Electronically Signed   By: Rolm Baptise M.D.   On: 11/25/2016 11:28    Procedures Procedures (including critical care time)  Medications Ordered in ED Medications  methocarbamol (ROBAXIN) tablet 1,500 mg (not administered)  HYDROcodone-acetaminophen (NORCO/VICODIN) 5-325 MG per tablet 1-2 tablet (not administered)  buPROPion (WELLBUTRIN XL) 24 hr tablet 300 mg (not administered)  carvedilol (COREG) tablet 25 mg (not administered)  amLODipine (NORVASC) tablet 10 mg (not administered)  simvastatin (ZOCOR) tablet 20 mg (not administered)  acetaminophen (TYLENOL) tablet 650 mg (not administered)  ondansetron (ZOFRAN) injection 4 mg (not administered)  0.9 %  sodium chloride infusion (not administered)  gi cocktail (Maalox,Lidocaine,Donnatal) (not administered)  aspirin EC tablet 325 mg (not administered)  gi cocktail (Maalox,Lidocaine,Donnatal) (not administered)  morphine 4 MG/ML injection 2 mg (not administered)     Initial Impression / Assessment and Plan / ED Course  I have reviewed the triage vital signs and the nursing notes.  Pertinent labs & imaging results that were available during my care of the patient were reviewed by me and considered in my medical decision making (see chart for details).  56 year old male presents with chest pain and SOB which was  relieved by nitro, ASA, rest. EKG remarkable for Q waves in inferior leads. CBC remarkable for decreased in Hgb to 9.6. BMP remarkable for increase in SCr to 1.27. Trop is 0. CXR remarkable for increased vascular congestion. Although it is  unlike prior anginal episodes, his HEART score is 5 and story is concerning enough that Obs admission is warranted. He is hypertensive but otherwise vitals are normal. Spoke with Lurlean Leyden, NP who will admit pt.   Final Clinical Impressions(s) / ED Diagnoses   Final diagnoses:  Chest pain, unspecified type    New Prescriptions New Prescriptions   No medications on file     Iris Pert 11/25/16 [place], Julie, MD 11/25/16 1525

## 2016-11-25 NOTE — ED Notes (Signed)
Pt's family member came to desk, requesting for pt to eat. RN notified.

## 2016-11-25 NOTE — ED Triage Notes (Signed)
Per ems, c/o center L chest pain since this morning, described as squeezing, does not radiate. No SOB. Slight nausea. Pt AAOX4, ambulatory, pink warm and dry, took 324 asa PTA. Given 1 nitro by ems, pain went from 6/10 to 4/10. Nitro dropped pressure so no more given. EKG unremarkable.

## 2016-11-25 NOTE — H&P (Signed)
History and Physical    Daniel Blevins IDP:[id number] DOB: Jul 07, 1960 DOA: 11/25/2016  PCP: Lajean Manes, MD Patient coming from: home Chief Complaint: chest pain  HPI: Daniel Blevins is a very pleasant 56 y.o. male with medical history significant for hypertension, CAD status post CABG 2001, abdominal aortic aneurysm status post repair, sleep apnea, history of gastric bypass presents to the emergency Department chief complaint chest pain. Patient's being admitted for chest pain rule out.  Information is obtained from the patient and the chart. He reports he was in his usual state of health until this morning he was "walking around my apartment" developed sudden onset left anterior chest pain. He describes the pain as constant and a "squeezing" sensation. He denies any radiation to the shoulder arm neck or jaw. He denies any diaphoresis but does endorse some "labored" breathing. He denies headache dizziness syncope or near-syncope. He denies any abdominal pain nausea vomiting. He denies any lower extremity edema orthopnea. He denies dysuria hematuria frequency or urgency. He denies diarrhea constipation melena right red blood per rectum. He denies any fever recent travel or sick contacts. He does state that he saw his cardiologist just yesterday for routine visit. Only change made in medications was the addition of Wellbutrin.    ED Course: In the emergency department he is afebrile with blood pressures high end of normal he's not hypoxic. He is provided with nitroglycerin and aspirin in the field. At the time of admission he is chest pain free  Review of Systems: As per HPI otherwise all other systems reviewed and are negative.   Ambulatory Status: Ambulates independently with a steady gait is independent with ADLs  Past Medical History:  Diagnosis Date  . AKI (acute kidney injury) ([place])   . Chest pain   . Coronary artery disease   . Deafness in right ear   . Depression   . Dysrhythmia   .  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 History of kidney stones   . Hx of CABG   . Hypertension   . Sleep apnea   . Urinary frequency     Past Surgical History:  Procedure Laterality Date  . ABDOMINAL AORTIC ENDOVASCULAR STENT GRAFT N/A 04/30/2014   Procedure: ABDOMINAL AORTIC ENDOVASCULAR STENT GRAFT;  Surgeon: Elam Dutch, MD;  Location: [facility] OR;  Service: Vascular;  Laterality: N/A;  . ABDOMINAL AORTIC ENDOVASCULAR STENT GRAFT N/A 05/26/2015   Procedure: ABDOMINAL AORTIC ENDOVASCULAR STENT GRAFT RE-INTERVENTION; REPAIR OF PROXIMAL CUFF;  Surgeon: Elam Dutch, MD;  Location: [place];  Service: Vascular;  Laterality: N/A;  . BACK SURGERY  2009   lower back  . CARDIAC CATHETERIZATION    . COLONOSCOPY    . CORONARY ARTERY BYPASS GRAFT  2001  . CYSTOSCOPY WITH RETROGRADE PYELOGRAM, URETEROSCOPY AND STENT PLACEMENT Bilateral 02/07/2014   Procedure: CYSTOSCOPY WITH BILATERAL RETROGRADE PYELOGRAM, BILATERAL URETEROSCOPY, RIGHT EXTRACTION OF STONES AND RIGHT STENT PLACEMENT;  Surgeon: Jorja Loa, MD;  Location[facility];  Service: Urology;  Laterality: Bilateral;  . ESOPHAGOGASTRODUODENOSCOPY    . IR GENERIC HISTORICAL  11/06/2015   IR RADIOLOGIST EVAL & MGMT 11/06/2015 Jacqulynn Cadet, MD [facility] INTERV RAD  . IR GENERIC HISTORICAL  04/20/2016   IR RADIOLOGIST EVAL & MGMT 04/20/2016 Jacqulynn Cadet, MD [facility] INTERV RAD  . LAPAROSCOPIC GASTRIC BYPASS  2010   [facility]  . LAPAROSCOPIC LYSIS OF ADHESIONS N/A 03/06/2013   Procedure: LAPAROSCOPIC LYSIS OF ADHESIONS AND CLOSURE OF INTERNAL HERNIAS x2;  Surgeon: Remo Lipps C.  Gross, MD;  Location: [facility];  Service: General;  Laterality: N/A;  . LAPAROSCOPY N/A 03/06/2013   Procedure: LAPAROSCOPY DIAGNOSTIC;  Surgeon: Adin Hector, MD;  Location: [facility];  Service: General;  Laterality: N/A;  . PERIPHERAL VASCULAR CATHETERIZATION Right 04/30/2014   Procedure: EMBOLIZATION right internal iliac;  Surgeon: Elam Dutch, MD;  Location:  [place];  Service: Vascular;  Laterality: Right;    Social History   Social History  . Marital status: Married    Spouse name: N/A  . Number of children: N/A  . Years of education: N/A   Occupational History  . Not on file.   Social History Main Topics  . Smoking status: Never Smoker  . Smokeless tobacco: Never Used  . Alcohol use Yes  . Drug use: No  . Sexual activity: No   Other Topics Concern  . Not on file   Social History Narrative  . No narrative on file   Lives at home with his wife. 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 Lung cancer Mother   . Cancer Mother        Lung  . AAA (abdominal aortic aneurysm) Father   . Heart disease Father        before age 21  . Heart disease Brother        before age 56    Prior to Admission medications   Medication Sig Start Date End Date Taking? Authorizing Provider  amLODipine (NORVASC) 10 MG tablet Take 10 mg by mouth daily.    [provider]  aspirin 81 MG tablet Take 81 mg by mouth every morning.     [provider]  buPROPion (WELLBUTRIN XL) 300 MG 24 hr tablet Take 300 mg by mouth daily. 12/12/15   [provider]  carvedilol (COREG) 25 MG tablet TAKE 2 TABLETS BY MOUTH EVERY MORNING 01/01/16   Belva Crome, MD  HYDROcodone-acetaminophen (NORCO/VICODIN) 5-325 MG tablet Take 1-2 tablets by mouth 2 (two) times daily as needed for moderate pain. 04/29/16   Mcarthur Rossetti, MD  lisinopril (PRINIVIL,ZESTRIL) 40 MG tablet Take 1 tablet (40 mg total) by mouth every morning. 01/01/16   Belva Crome, MD  methocarbamol (ROBAXIN-750) 750 MG tablet Take 2 tablets (1,500 mg total) by mouth every 8 (eight) hours as needed for muscle spasms. 05/25/16   Jacqulynn Cadet, MD  simvastatin (ZOCOR) 20 MG tablet Take 1 tablet (20 mg total) by mouth every morning. 03/14/14   Belva Crome, MD     Physical Exam: Vitals:   11/25/16 1215 11/25/16 1230 11/25/16 1245 11/25/16 1300  BP: 134/89 (!) 132/94 (!) 134/94 129/88  Pulse: 63 63 66 62  Resp: (!) 21 (!) 21 14 18   Temp:      TempSrc:      SpO2: 95% 95% 95% 97%  Weight:      Height:         General:  Appears calm and comfortable In no acute distress Eyes:  PERRL, EOMI, normal lids, iris ENT:  grossly normal hearing, lips & tongue, mucous membranes of his mouth are moist and pink Neck:  no LAD, masses or thyromegaly Cardiovascular:  RRR, no m/r/g. Trace LE edema.  Respiratory:  CTA bilaterally, no w/r/r. Normal respiratory effort. Abdomen:  soft, ntnd, positive bowel sounds throughout no guarding or rebounding, obese Skin:  no rash or induration seen  on limited exam Musculoskeletal:  grossly normal tone BUE/BLE, good ROM, no bony abnormality Psychiatric:  grossly normal mood and affect, speech fluent and appropriate, AOx3 Neurologic:  CN 2-12 grossly intact, moves all extremities in coordinated fashion, sensation intact  Labs on Admission: I have personally reviewed following labs and imaging studies  CBC:  Recent Labs Lab 11/25/16 1046  WBC 4.1  HGB 9.6*  HCT 28.6*  MCV 78.8  PLT 378*   Basic Metabolic Panel:  Recent Labs Lab 11/25/16 1046  NA 136  K 4.3  CL 104  CO2 23  GLUCOSE 123*  BUN 12  CREATININE 1.27*  CALCIUM 8.6*   GFR: Estimated Creatinine Clearance: 85.3 mL/min (A) (by C-G formula based on SCr of 1.27 mg/dL (H)). Liver Function Tests: No results for input(s): AST, ALT, ALKPHOS, BILITOT, PROT, ALBUMIN in the last 168 hours. No results for input(s): LIPASE, AMYLASE in the last 168 hours. No results for input(s): AMMONIA in the last 168 hours. Coagulation Profile: No results for input(s): INR, PROTIME in the last 168 hours. Cardiac Enzymes: No results for input(s): CKTOTAL, CKMB, CKMBINDEX, TROPONINI in the last 168 hours. BNP (last 3 results) No results for input(s): PROBNP in the  last 8760 hours. HbA1C: No results for input(s): HGBA1C in the last 72 hours. CBG: No results for input(s): GLUCAP in the last 168 hours. Lipid Profile: No results for input(s): CHOL, HDL, LDLCALC, TRIG, CHOLHDL, LDLDIRECT in the last 72 hours. Thyroid Function Tests: No results for input(s): TSH, T4TOTAL, FREET4, T3FREE, THYROIDAB in the last 72 hours. Anemia Panel: No results for input(s): VITAMINB12, FOLATE, FERRITIN, TIBC, IRON, RETICCTPCT in the last 72 hours. Urine analysis:    Component Value Date/Time   COLORURINE AMBER (A) 05/22/2015 1547   APPEARANCEUR CLOUDY (A) 05/22/2015 1547   LABSPEC 1.024 05/22/2015 1547   PHURINE 5.5 05/22/2015 1547   GLUCOSEU NEGATIVE 05/22/2015 1547   HGBUR MODERATE (A) 05/22/2015 1547   BILIRUBINUR NEGATIVE 05/22/2015 1547   KETONESUR NEGATIVE 05/22/2015 1547   PROTEINUR NEGATIVE 05/22/2015 1547   UROBILINOGEN 1.0 04/29/2014 1302   NITRITE NEGATIVE 05/22/2015 1547   LEUKOCYTESUR NEGATIVE 05/22/2015 1547    Creatinine Clearance: Estimated Creatinine Clearance: 85.3 mL/min (A) (by C-G formula based on SCr of 1.27 mg/dL (H)).  Sepsis Labs: @LABRCNTIP (procalcitonin:4,lacticidven:4) )No results found for this or any previous visit (from the past 240 hour(s)).   Radiological Exams on Admission: Dg Chest 2 View  Result Date: 11/25/2016 CLINICAL DATA:  Chest pain, shortness of Breath EXAM: CHEST  2 VIEW COMPARISON:  05/26/2015 FINDINGS: Prior CABG. Heart is borderline in size. Mild vascular congestion. No confluent opacities or effusions. No acute bony abnormality. IMPRESSION: Borderline heart size.  Vascular congestion. Electronically Signed   By: Rolm Baptise M.D.   On: 11/25/2016 11:28    EKG: Independently reviewed. Sinus rhythm Abnormal inferior Q waves  Assessment/Plan Principal Problem:   Chest pain Active Problems:   History of gastric bypass   CAD (coronary artery disease) of artery bypass graft   Hyperlipidemia   Essential  hypertension   Endoleak of aortic graft ([facility])   Anemia   AKI (acute kidney injury) ([place])   Obesity   #1. Chest pain. Heart score 5. Some typical and atypical features. Initial troponin negative. EKG SR, initial troponin negative. Chest x-ray heart borderline in size mild vascular congestion no opaque cities or effusions.  -Admit to telemetry -Cycle troponin -Serial EKG -Echocardiogram -Supportive therapy -NM stress test ordered -Nothing by mouth past midnight  #  2. Anemia. Hemoglobin is 9.6. Chart review indicates one year ago hemoglobin was 12.6. He does have a history of2 endoleak of abdominal aortic aneurysm. CT NGO last year reveals aneurysm stable -FOBT -Anemia panel -OP follow up  #3. Acute kidney injury. Creatinine 1.27 on admission. Chart review indicates creatinine 1.0 last year at this time. Home medications include lisinopril -Hold nephrotoxins -Gentle IV fluids -Monitor urine output -Recheck in the morning  #4. Hypertension. Fair control in the emergency department. Home medications include amlodipine, Coreg and lisinopril. -We'll hold lisinopril as noted above -Continue amlodipine and Coreg  #5. Obesity/history of gastric bypass. BMI 36.8 -nutritional consult  #6. Hyperlipidemia -lipid panel    DVT prophylaxis: scd  Code Status: obs  Family Communication: none present  Disposition Plan: home  Consults called: none  Admission status: obs    Dyanne Carrel M MD [facility]  If 7PM-7AM, please contact night-coverage [email or web address] Password [email or web address]  11/25/2016, 1:42 PM

## 2016-11-25 NOTE — Progress Notes (Signed)
Verbal order from [facility] MD for Trazodone 100 mg orally HS as needed, order entered Neta Mends RN 7:04 PM

## 2016-11-26 ENCOUNTER — Observation Stay (HOSPITAL_BASED_OUTPATIENT_CLINIC_OR_DEPARTMENT_OTHER): Payer: 59

## 2016-11-26 ENCOUNTER — Other Ambulatory Visit: Payer: Self-pay

## 2016-11-26 DIAGNOSIS — D519 Vitamin B12 deficiency anemia, unspecified: Secondary | ICD-10-CM | POA: Diagnosis not present

## 2016-11-26 DIAGNOSIS — I10 Essential (primary) hypertension: Secondary | ICD-10-CM | POA: Diagnosis not present

## 2016-11-26 DIAGNOSIS — D61818 Other pancytopenia: Secondary | ICD-10-CM | POA: Diagnosis not present

## 2016-11-26 DIAGNOSIS — I25709 Atherosclerosis of coronary artery bypass graft(s), unspecified, with unspecified angina pectoris: Secondary | ICD-10-CM | POA: Diagnosis not present

## 2016-11-26 DIAGNOSIS — N182 Chronic kidney disease, stage 2 (mild): Secondary | ICD-10-CM | POA: Diagnosis not present

## 2016-11-26 DIAGNOSIS — R072 Precordial pain: Secondary | ICD-10-CM

## 2016-11-26 DIAGNOSIS — N179 Acute kidney failure, unspecified: Secondary | ICD-10-CM

## 2016-11-26 DIAGNOSIS — R079 Chest pain, unspecified: Secondary | ICD-10-CM

## 2016-11-26 LAB — CBC
HCT: 28 % — ABNORMAL LOW (ref 39.0–52.0)
Hemoglobin: 8.9 g/dL — ABNORMAL LOW (ref 13.0–17.0)
MCH: 25.4 pg — ABNORMAL LOW (ref 26.0–34.0)
MCHC: 31.8 g/dL (ref 30.0–36.0)
MCV: 80 fL (ref 78.0–100.0)
Platelets: 128 10*3/uL — ABNORMAL LOW (ref 150–400)
RBC: 3.5 MIL/uL — ABNORMAL LOW (ref 4.22–5.81)
RDW: 14.4 % (ref 11.5–15.5)
WBC: 3.9 10*3/uL — AB (ref 4.0–10.5)

## 2016-11-26 LAB — NM MYOCAR MULTI W/SPECT W/WALL MOTION / EF
CSEPEDS: 0 s
Estimated workload: 1 METS
Exercise duration (min): 5 min
Peak HR: 81 {beats}/min
Rest HR: 57 {beats}/min

## 2016-11-26 LAB — BASIC METABOLIC PANEL
ANION GAP: 9 (ref 5–15)
BUN: 15 mg/dL (ref 6–20)
CHLORIDE: 105 mmol/L (ref 101–111)
CO2: 24 mmol/L (ref 22–32)
CREATININE: 1.36 mg/dL — AB (ref 0.61–1.24)
Calcium: 8.3 mg/dL — ABNORMAL LOW (ref 8.9–10.3)
GFR calc non Af Amer: 57 mL/min — ABNORMAL LOW (ref >60)
Glucose, Bld: 102 mg/dL — ABNORMAL HIGH (ref 65–99)
Potassium: 4 mmol/L (ref 3.5–5.1)
SODIUM: 138 mmol/L (ref 135–145)

## 2016-11-26 LAB — ECHOCARDIOGRAM COMPLETE
HEIGHTINCHES: 71 in
WEIGHTICAEL: 4180.8 [oz_av]

## 2016-11-26 LAB — HIV ANTIBODY (ROUTINE TESTING W REFLEX): HIV SCREEN 4TH GENERATION: NONREACTIVE

## 2016-11-26 MED ORDER — REGADENOSON 0.4 MG/5ML IV SOLN
INTRAVENOUS | Status: AC
  Administered 2016-11-26: 0.4 mg via INTRAVENOUS
  Filled 2016-11-26: qty 5

## 2016-11-26 MED ORDER — REGADENOSON 0.4 MG/5ML IV SOLN
0.4000 mg | Freq: Once | INTRAVENOUS | Status: AC
  Administered 2016-11-26: 0.4 mg via INTRAVENOUS
  Filled 2016-11-26: qty 5

## 2016-11-26 MED ORDER — VITAMIN B-12 1000 MCG PO TABS
1000.0000 ug | ORAL_TABLET | Freq: Every day | ORAL | Status: DC
  Administered 2016-11-26 – 2016-11-27 (×2): 1000 ug via ORAL
  Filled 2016-11-26 (×2): qty 1

## 2016-11-26 MED ORDER — SODIUM CHLORIDE 0.9 % IV SOLN
INTRAVENOUS | Status: AC
  Administered 2016-11-26: 20:00:00 via INTRAVENOUS

## 2016-11-26 MED ORDER — HYDRALAZINE HCL 20 MG/ML IJ SOLN: 10.0000 mg | Freq: Four times a day (QID) | INTRAMUSCULAR | Status: DC | PRN

## 2016-11-26 MED ORDER — TECHNETIUM TC 99M TETROFOSMIN IV KIT
30.0000 | PACK | Freq: Once | INTRAVENOUS | Status: AC | PRN
  Administered 2016-11-26: 30 via INTRAVENOUS

## 2016-11-26 MED ORDER — TECHNETIUM TC 99M TETROFOSMIN IV KIT
10.0000 | PACK | Freq: Once | INTRAVENOUS | Status: AC | PRN
  Administered 2016-11-26: 10 via INTRAVENOUS

## 2016-11-26 NOTE — Progress Notes (Signed)
    The nuclear medicine exam stress portion has been completed. The results to this test are pending post Lexiscan images and radiology interpretation.

## 2016-11-26 NOTE — Progress Notes (Signed)
  Echocardiogram 2D Echocardiogram has been performed.  Daniel Blevins 11/26/2016, 2:32 PM

## 2016-11-26 NOTE — CV Procedure (Signed)
2D echo attempted but pt in [place]

## 2016-11-26 NOTE — Progress Notes (Signed)
PROGRESS NOTE   Daniel Blevins  MEQ:[id number]    DOB: Sep 28, 1960    DOA: 11/25/2016  PCP: Lajean Manes, MD   I have briefly reviewed patients previous medical records in [facility].  Brief Narrative:  56 year old male with PMH of CAD, remote CABG (2001), OSA, HTN, AAA repair, laparoscopic gastric bypass, doing MBA at [employer], mostly sedentary lifestyle, presented to ED with new onset of precordial chest pain that developed on the afternoon of admission, squeezing in nature, nonradiating, association with transient dyspnea but no diaphoresis, nausea or vomiting. Lasted maybe an hour or so and resolved after he received aspirin and NTG in the ED. No recurrence of chest pain. Admitted for observation by admitting team and nuclear stress test and echo requested. No initial cardiology consultation was requested. Nuclear stress test results abnormal. Cardiology consulted and will see on 7/28.   Assessment & Plan:   Principal Problem:   Chest pain Active Problems:   History of gastric bypass   CAD (coronary artery disease) of artery bypass graft   Hyperlipidemia   Essential hypertension   [place] of aortic graft (HCC)   Anemia   AKI (acute kidney injury) ([place])   Obesity   1. Chest pain: Troponin 3 negative. EKG without acute changes. Discussed with cardiology who indicated that his nuclear stress test is intermediate risk and they will formally consult on 7/28 and make further recommendations. Continue aspirin, carvedilol and statins. 2-D echo shows LVEF 60-65 percent, normal wall motion and no regional wall motion abnormalities, grade 1 diastolic dysfunction. 2. Pancytopenia: Unclear etiology. Anemia and thrombocytopenia appears chronic. Anemia panel results appreciated: B12: 204.? Related to B12 deficiency from gastric bypass surgery. Start B12 supplements. Outpatient follow-up and GI screening if not already performed. 3. Acute kidney injury versus stage II chronic kidney  disease: Creatinine in June 2017: 1.14. Presented with creatinine of 1.27. This is marginally increased to 1.3. Not currently on ACEI/ARB. Clinically euvolemic. Brief IV fluids and follow BMP in a.m. 4. Essential hypertension: Uncontrolled. Continue amlodipine and carvedilol. When necessary IV hydralazine. May need to add a third agent. 5. CAD status post remote CABG: Management as indicated above.   DVT prophylaxis: SCDs Code Status: Full Family Communication: None at bedside Disposition: DC home when medically improved   Consultants:  Cardiology   Procedures:  2-D echo 11/26/16: Study Conclusions  - Left ventricle: The cavity size was normal. Wall thickness was   increased in a pattern of moderate LVH. Systolic function was   normal. The estimated ejection fraction was in the range of 60%   to 65%. Wall motion was normal; there were no regional wall   motion abnormalities. Doppler parameters are consistent with   abnormal left ventricular relaxation (grade 1 diastolic   dysfunction). The E/e&' ratio is between 8-15, suggesting   indeterminate LV filling pressure. - Aorta: Dilated aorta. Aortic root dimension: 43 mm (ED).   Ascending aortic diameter: 39 mm (S). - Left atrium: The atrium was normal in size. - Right atrium: The atrium was mildly dilated. - Inferior vena cava: The vessel was dilated. The respirophasic   diameter changes were blunted (< 50%), consistent with elevated   central venous pressure.  Impressions:  - Compared to a prior echo in 2014, the LVEF has improved to   60-65%. The aorta is dilated - 4.3 cm at the root and 3.9 cm at   the most distally visualized portion of the ascending aorta  Antimicrobials:  None  Subjective: Denies any recurrence of chest pain. No dyspnea, palpitations.   ROS: No dizziness, lightheadedness.  Objective:  Vitals:   11/26/16 1014 11/26/16 1058 11/26/16 1100 11/26/16 1102  BP: (!) 155/86 (!) 194/83 (!) 187/78 (!)  187/74  Pulse: (!) 56 73 74 68  Resp:      Temp:      TempSrc:      SpO2:      Weight:      Height:      Respiratory rate 18/m, temperature 98.5, oxygen saturation 97%.  Examination:  General exam: Pleasant young male sitting up comfortably in bed. Respiratory system: Clear to auscultation. Respiratory effort normal. Cardiovascular system: S1 & S2 heard, RRR. No JVD, murmurs, rubs, gallops or clicks. No pedal edema. Telemetry: Sinus rhythm. Gastrointestinal system: Abdomen is nondistended, soft and nontender. No organomegaly or masses felt. Normal bowel sounds heard. Central nervous system: Alert and oriented. No focal neurological deficits. Extremities: Symmetric 5 x 5 power. Skin: No rashes, lesions or ulcers Psychiatry: Judgement and insight appear normal. Mood & affect appropriate.     Data Reviewed: I have personally reviewed following labs and imaging studies  CBC:  Recent Labs Lab 11/25/16 1046 11/26/16 0325  WBC 4.1 3.9*  HGB 9.6* 8.9*  HCT 28.6* 28.0*  MCV 78.8 80.0  PLT 137* 595*   Basic Metabolic Panel:  Recent Labs Lab 11/25/16 1046 11/26/16 0325  NA 136 138  K 4.3 4.0  CL 104 105  CO2 23 24  GLUCOSE 123* 102*  BUN 12 15  CREATININE 1.27* 1.36*  CALCIUM 8.6* 8.3*   Liver Function Tests: No results for input(s): AST, ALT, ALKPHOS, BILITOT, PROT, ALBUMIN in the last 168 hours. Coagulation Profile:  Recent Labs Lab 11/25/16 1358  INR 1.10   Cardiac Enzymes:  Recent Labs Lab 11/25/16 1358 11/25/16 1521 11/25/16 1945  TROPONINI <0.03 <0.03 <0.03   HbA1C: No results for input(s): HGBA1C in the last 72 hours. CBG: No results for input(s): GLUCAP in the last 168 hours.  No results found for this or any previous visit (from the past 240 hour(s)).       Radiology Studies: Dg Chest 2 View  Result Date: 11/25/2016 CLINICAL DATA:  Chest pain, shortness of Breath EXAM: CHEST  2 VIEW COMPARISON:  05/26/2015 FINDINGS: Prior CABG. Heart  is borderline in size. Mild vascular congestion. No confluent opacities or effusions. No acute bony abnormality. IMPRESSION: Borderline heart size.  Vascular congestion. Electronically Signed   By: Rolm Baptise M.D.   On: 11/25/2016 11:28   Nm Myocar Multi W/spect W/wall Motion / Ef  Addendum Date: 11/26/2016    There were nonspecific T wave changes during the infusion  There is a medium defect of mild severity present in the basal inferior, mid inferior and apical inferior location. The defect is partially reversible. This could represent variations in diaphragmatic attenuation artifact but cannot rule out ischemia in the inferior wall.  The left ventricular ejection fraction is mildly decreased (45-54%).  Nuclear stress EF: 53%.  This is an intermediate risk study.    Result Date: 11/26/2016  There were nonspecific T wave changes during the infusion  Defect 1: There is a medium defect of mild severity present in the basal inferior, mid inferior and apical inferior location.  The left ventricular ejection fraction is mildly decreased (45-54%).  Nuclear stress EF: 53%.  This is a low risk study.         Scheduled Meds: . amLODipine  10  mg Oral Daily  . aspirin EC  325 mg Oral Daily  . buPROPion  300 mg Oral Daily  . carvedilol  25 mg Oral BID WC  . simvastatin  20 mg Oral q1800   Continuous Infusions:   LOS: 0 days     Adryan Shin, MD, FACP, FHM. [facility] Pager [phone]  If 7PM-7AM, please contact night-coverage [email or web address] Password [facility] 11/26/2016, 6:38 PM

## 2016-11-26 NOTE — Progress Notes (Signed)
3 mins, Tiffany PA at bedside, pt reports SOB has passed, denies CP

## 2016-11-26 NOTE — Progress Notes (Signed)
5 mins, procedure ended, denies SOB/CP Tiffany PA at bedside

## 2016-11-26 NOTE — Plan of Care (Signed)
Problem: Food- and Nutrition-Related Knowledge Deficit (NB-1.1) Goal: Nutrition education Formal process to instruct or train a patient/client in a skill or to impart knowledge to help patients/clients voluntarily manage or modify food choices and eating behavior to maintain or improve health.  Outcome: Completed/Met Date Met: 11/26/16 Nutrition Education Note  RD consulted for nutrition education regarding diet education. Pt with hx of HTN, CAD s/p CABG, HL, gastric bypass     Component Value Date/Time   CHOL 204 (H) 11/25/2016 1349   TRIG 68 11/25/2016 1349   HDL 74 11/25/2016 1349   CHOLHDL 2.8 11/25/2016 1349   VLDL 14 11/25/2016 1349   LDLCALC 116 (H) 11/25/2016 1349    RD provided "Heart Healthy Nutrition Therapy" handout from the Academy of [employer]. Reviewed patient's dietary recall. Pt has currently been eating poorly with a lot of fast food. Reports he has been stressed and has just been doing "whatever is easy and tastes good." Pt also acknowledges that he not been to the gym in a year and wants to get back to exercising. Provided examples on ways to decrease sodium and fat intake in diet. Discouraged intake of processed foods and use of salt shaker. Encouraged fresh fruits and vegetables as well as whole grain sources of carbohydrates to maximize fiber intake. Teach back method used.  Pt with hx of gastric bypass and does not take any vitamin/mineral supplements. Pt acknowledges that he was recently told he was B12 deficient. Encouraged pt to take B12 supplement in addition to MVI, calcium plus vitamin D. Handout with recommendations provided  Expect good compliance.  Body mass index is 36.44 kg/m. Pt meets criteria for obesity unspecified based on current BMI.  Current diet order is Heart Healthy, patient is consuming approximately 100% of meals at this time with good appetite . Labs and medications reviewed. No further nutrition interventions warranted at this  time. RD contact information provided. If additional nutrition issues arise, please re-consult RD.  Kerman Passey MS, RD, LDN [phone] Pager  [phone] Weekend/On-Call Pager

## 2016-11-26 NOTE — Progress Notes (Signed)
1 min in, Tiffany PA at bedside, pt reports some SOB

## 2016-11-26 NOTE — Progress Notes (Signed)
Pre procedure VS 

## 2016-11-26 NOTE — Progress Notes (Signed)
    NUCLEAR STRESS RESULTS  Addendum    There were nonspecific T wave changes during the infusion  There is a medium defect of mild severity present in the basal inferior, mid inferior and apical inferior location. The defect is partially reversible. This could represent variations in diaphragmatic attenuation artifact but cannot rule out ischemia in the inferior wall.  The left ventricular ejection fraction is mildly decreased (45-54%).  Nuclear stress EF: 53%.  This is an intermediate risk study.    Addended by Sueanne Margarita, MD on 11/26/2016 2:34 PM    Study Result    There were nonspecific T wave changes during the infusion  Defect 1: There is a medium defect of mild severity present in the basal inferior, mid inferior and apical inferior location.  The left ventricular ejection fraction is mildly decreased (45-54%).  Nuclear stress EF: 53%.  This is a low risk study.     The patients tests was abnormal. Discussed results with Dr. Radford Pax who recommends formal cardiology consultation. She recommends comparing the patients presenting symptoms and nuclear findings for further recommendations or work-up. Will add patient to the list to be seen tomorrow.

## 2016-11-27 ENCOUNTER — Observation Stay (HOSPITAL_COMMUNITY): Payer: 59

## 2016-11-27 DIAGNOSIS — I2510 Atherosclerotic heart disease of native coronary artery without angina pectoris: Secondary | ICD-10-CM | POA: Diagnosis not present

## 2016-11-27 DIAGNOSIS — I10 Essential (primary) hypertension: Secondary | ICD-10-CM

## 2016-11-27 DIAGNOSIS — D61818 Other pancytopenia: Secondary | ICD-10-CM | POA: Diagnosis not present

## 2016-11-27 DIAGNOSIS — N182 Chronic kidney disease, stage 2 (mild): Secondary | ICD-10-CM

## 2016-11-27 DIAGNOSIS — D519 Vitamin B12 deficiency anemia, unspecified: Secondary | ICD-10-CM | POA: Diagnosis not present

## 2016-11-27 DIAGNOSIS — E782 Mixed hyperlipidemia: Secondary | ICD-10-CM

## 2016-11-27 DIAGNOSIS — R079 Chest pain, unspecified: Secondary | ICD-10-CM

## 2016-11-27 LAB — CBC
HEMATOCRIT: 28.7 % — AB (ref 39.0–52.0)
HEMOGLOBIN: 9.2 g/dL — AB (ref 13.0–17.0)
MCH: 25.8 pg — ABNORMAL LOW (ref 26.0–34.0)
MCHC: 32.1 g/dL (ref 30.0–36.0)
MCV: 80.6 fL (ref 78.0–100.0)
Platelets: 124 10*3/uL — ABNORMAL LOW (ref 150–400)
RBC: 3.56 MIL/uL — ABNORMAL LOW (ref 4.22–5.81)
RDW: 14.4 % (ref 11.5–15.5)
WBC: 3.6 10*3/uL — AB (ref 4.0–10.5)

## 2016-11-27 LAB — BASIC METABOLIC PANEL
ANION GAP: 7 (ref 5–15)
BUN: 17 mg/dL (ref 6–20)
CALCIUM: 8.2 mg/dL — AB (ref 8.9–10.3)
CO2: 24 mmol/L (ref 22–32)
Chloride: 107 mmol/L (ref 101–111)
Creatinine, Ser: 1.35 mg/dL — ABNORMAL HIGH (ref 0.61–1.24)
GFR calc non Af Amer: 57 mL/min — ABNORMAL LOW (ref >60)
Glucose, Bld: 98 mg/dL (ref 65–99)
Potassium: 4 mmol/L (ref 3.5–5.1)
SODIUM: 138 mmol/L (ref 135–145)

## 2016-11-27 MED ORDER — CARVEDILOL 25 MG PO TABS: 25.0000 mg | ORAL_TABLET | Freq: Two times a day (BID) | ORAL | 0 refills | Status: DC

## 2016-11-27 MED ORDER — CYANOCOBALAMIN 1000 MCG PO TABS: 1000.0000 ug | ORAL_TABLET | Freq: Every day | ORAL | 0 refills | Status: DC

## 2016-11-27 NOTE — Progress Notes (Signed)
Cardiology PA paged and asked if patient can have his morning medication and a diet. Awaiting response.

## 2016-11-27 NOTE — Consult Note (Signed)
Cardiology Consultation:   Patient ID: Daniel Blevins; [id number]; 08/03/1960   Admit date: 11/25/2016 Date of Consult: 11/27/2016  Primary Care Provider: Lajean Manes, MD Primary Cardiologist: Dr. Tamala Julian Primary Electrophysiologist:  None   Patient Profile:   Daniel Blevins is a 56 y.o. male with a hx of hypertension, CAD status post CABG 2001, abdominal aortic aneurysm status post repair, sleep apnea, history of gastric bypass .  The patient is being seen today for the evaluation of chest pain at the request of Dr. Algis Liming.  History of Present Illness:   Daniel Blevins saw Dr. Tamala Julian this past year for his one year follow-up at this time he was doing well from a cardiac standpoint and not having any anginal symptoms. He presented to the ER on 7/26 for chest pain. He reports feeling well up until he started to walk around his apartment and developed sudden onset of left anterior chest pain. He described it as a squeezing sensation. He did not have any radiation or diaphoresis. He endorsed heavier breathing. He had a nitro and aspirin in the ER and his pain completely resolved. He denise that this felt like his prior MI but admits it was so long ago he cannot remember the pain exactly.   His work up revealed an EKG show Q waves to his inferior leads. Negative troponins x 3. His chest xray shows mild vascular congestion and a borderline heart size. His pain has been resolved since his admission. He had an echocardiogram performed yesterday that revealed moderate LVH, EF 60-65% grade 1 dd.  LDL is 116  He had a nuclear stress myoview done yesterday which resulted yesterday evening as intermediate risk.    Since being in the hospital he has not had any reoccurrence of his symptoms and overall feels well.  Blood pressure 115/81, pulse (!) 59, temperature 98.7 F (37.1 C), temperature source Oral, resp. rate 19, height 5\' 11"  (1.803 m), weight 260 lb 9.6 oz (118.2 kg), SpO2 94 %.   Past Medical  History:  Diagnosis Date  . AKI (acute kidney injury) ([place])   . Chest pain   . Coronary artery disease   . Deafness in right ear   . Depression   . Dysrhythmia   . 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 History of kidney stones   . Hx of CABG   . Hypertension   . Sleep apnea   . Urinary frequency     Past Surgical History:  Procedure Laterality Date  . ABDOMINAL AORTIC ENDOVASCULAR STENT GRAFT N/A 04/30/2014   Procedure: ABDOMINAL AORTIC ENDOVASCULAR STENT GRAFT;  Surgeon: Elam Dutch, MD;  Location: [facility] OR;  Service: Vascular;  Laterality: N/A;  . ABDOMINAL AORTIC ENDOVASCULAR STENT GRAFT N/A 05/26/2015   Procedure: ABDOMINAL AORTIC ENDOVASCULAR STENT GRAFT RE-INTERVENTION; REPAIR OF PROXIMAL CUFF;  Surgeon: Elam Dutch, MD;  Location: [facility];  Service: Vascular;  Laterality: N/A;  . BACK SURGERY  2009   lower back  . CARDIAC CATHETERIZATION    . COLONOSCOPY    . CORONARY ARTERY BYPASS GRAFT  2001  . CYSTOSCOPY WITH RETROGRADE PYELOGRAM, URETEROSCOPY AND STENT PLACEMENT Bilateral 02/07/2014   Procedure: CYSTOSCOPY WITH BILATERAL RETROGRADE PYELOGRAM, BILATERAL URETEROSCOPY, RIGHT EXTRACTION OF STONES AND RIGHT STENT PLACEMENT;  Surgeon: Jorja Loa, MD;  Location: [facility];  Service: Urology;  Laterality: Bilateral;  . ESOPHAGOGASTRODUODENOSCOPY    . IR GENERIC HISTORICAL  11/06/2015   IR RADIOLOGIST EVAL & MGMT 11/06/2015 Jacqulynn Cadet, MD [facility] INTERV  RAD  . IR GENERIC HISTORICAL  04/20/2016   IR RADIOLOGIST EVAL & MGMT 04/20/2016 Jacqulynn Cadet, MD [facility] INTERV RAD  . LAPAROSCOPIC GASTRIC BYPASS  2010   [facility]  . LAPAROSCOPIC LYSIS OF ADHESIONS N/A 03/06/2013   Procedure: LAPAROSCOPIC LYSIS OF ADHESIONS AND CLOSURE OF INTERNAL HERNIAS x2;  Surgeon: Adin Hector, MD;  Location: [facility];  Service: General;  Laterality: N/A;  . LAPAROSCOPY N/A 03/06/2013   Procedure: LAPAROSCOPY DIAGNOSTIC;  Surgeon: Adin Hector, MD;  Location: [facility];  Service:  General;  Laterality: N/A;  . PERIPHERAL VASCULAR CATHETERIZATION Right 04/30/2014   Procedure: EMBOLIZATION right internal iliac;  Surgeon: Elam Dutch, MD;  Location: [facility] OR;  Service: Vascular;  Laterality: Right;     Inpatient Medications: Scheduled Meds: . amLODipine  10 mg Oral Daily  . aspirin EC  325 mg Oral Daily  . buPROPion  300 mg Oral Daily  . carvedilol  25 mg Oral BID WC  . simvastatin  20 mg Oral q1800  . vitamin B-12  1,000 mcg Oral Daily   Continuous Infusions:  PRN Meds: acetaminophen, gi cocktail, hydrALAZINE, morphine injection, ondansetron (ZOFRAN) IV, traZODone  Allergies:   No Known Allergies  Social History:   Social History   Social History  . Marital status: Married    Spouse name: N/A  . Number of children: N/A  . Years of education: N/A   Occupational History  . Not on file.   Social History Main Topics  . Smoking status: Never Smoker  . Smokeless tobacco: Never Used  . Alcohol use Yes  . Drug use: No  . Sexual activity: No   Other Topics Concern  . Not on file   Social History Narrative  . No narrative on file      Family History:   The patient's family history includes AAA (abdominal aortic aneurysm) in his father; Cancer in his mother; Heart disease in his brother and father; Lung cancer in his mother.  ROS:  Please see the history of present illness.  All other ROS reviewed and negative.     Physical Exam/Data:   Vitals:   11/26/16 1102 11/26/16 2001 11/27/16 0023 11/27/16 0537  BP: (!) 187/74 118/83 117/73 115/81  Pulse: 68 (!) 59 64 (!) 59  Resp:  18  19  Temp:  98.4 F (36.9 C) 98.5 F (36.9 C) 98.7 F (37.1 C)  TempSrc:  Oral Oral Oral  SpO2:  95% 93% 94%  Weight:    260 lb 9.6 oz (118.2 kg)  Height:        Intake/Output Summary (Last 24 hours) at 11/27/16 0902 Last data filed at 11/27/16 0856  Gross per 24 hour  Intake              960 ml  Output              800 ml  Net              160 ml    Filed Weights   11/25/16 1522 11/26/16 0432 11/27/16 0537  Weight: 263 lb 5 oz (119.4 kg) 261 lb 4.8 oz (118.5 kg) 260 lb 9.6 oz (118.2 kg)   Body mass index is 36.35 kg/m.  General: Well developed, well nourished, in no acute distress. Obese Head: Normocephalic, atraumatic, Neck: Negative for carotid bruits. JVD not elevated. Lungs: Clear bilaterally to auscultation without wheezes, rales, or rhonchi. Breathing is unlabored. Heart: RRR with S1 S2. No murmurs,  rubs, or gallops appreciated. Abdomen: Soft, non-tender, non-distended with normoactive bowel sounds. Msk:  Strength and tone appear normal for age. Extremities: No clubbing or cyanosis. No edema.  Neuro: Alert and oriented X 3. No facial asymmetry. Psych:  Responds to questions appropriately with a normal affect.  EKG:  The EKG was personally reviewed and demonstrates NSR, new Q waves in inferior leads.  Relevant CV Studies:  Echocardiogram 11/26/2016 Study Conclusions  - Left ventricle: The cavity size was normal. Wall thickness was   increased in a pattern of moderate LVH. Systolic function was   normal. The estimated ejection fraction was in the range of 60%   to 65%. Wall motion was normal; there were no regional wall   motion abnormalities. Doppler parameters are consistent with   abnormal left ventricular relaxation (grade 1 diastolic   dysfunction). The E/e&' ratio is between 8-15, suggesting   indeterminate LV filling pressure. - Aorta: Dilated aorta. Aortic root dimension: 43 mm (ED).   Ascending aortic diameter: 39 mm (S). - Left atrium: The atrium was normal in size. - Right atrium: The atrium was mildly dilated. - Inferior vena cava: The vessel was dilated. The respirophasic   diameter changes were blunted (< 50%), consistent with elevated   central venous pressure.  Impressions:  - Compared to a prior echo in 2014, the LVEF has improved to   60-65%. The aorta is dilated - 4.3 cm at the root and 3.9  cm at   the most distally visualized portion of the ascending aorta.  Nuclear Stress Test  Addendum    There were nonspecific T wave changes during the infusion  There is a medium defect of mild severity present in the basal inferior, mid inferior and apical inferior location. The defect is partially reversible. This could represent variations in diaphragmatic attenuation artifact but cannot rule out ischemia in the inferior wall.  The left ventricular ejection fraction is mildly decreased (45-54%).  Nuclear stress EF: 53%.  This is an intermediate risk study.    Addended by Sueanne Margarita, MD on 11/26/2016 2:34 PM    Study Result    There were nonspecific T wave changes during the infusion  Defect 1: There is a medium defect of mild severity present in the basal inferior, mid inferior and apical inferior location.  The left ventricular ejection fraction is mildly decreased (45-54%).  Nuclear stress EF: 53%.  This is a low risk study.        Laboratory Data:  Chemistry Recent Labs Lab 11/25/16 1046 11/26/16 0325 11/27/16 0511  NA 136 138 138  K 4.3 4.0 4.0  CL 104 105 107  CO2 23 24 24   GLUCOSE 123* 102* 98  BUN 12 15 17   CREATININE 1.27* 1.36* 1.35*  CALCIUM 8.6* 8.3* 8.2*  GFRNONAA >60 57* 57*  GFRAA >60 >60 >60  ANIONGAP 9 9 7     No results for input(s): PROT, ALBUMIN, AST, ALT, ALKPHOS, BILITOT in the last 168 hours. Hematology Recent Labs Lab 11/25/16 1046 11/25/16 1358 11/26/16 0325 11/27/16 0511  WBC 4.1  --  3.9* 3.6*  RBC 3.63* 3.79* 3.50* 3.56*  HGB 9.6*  --  8.9* 9.2*  HCT 28.6*  --  28.0* 28.7*  MCV 78.8  --  80.0 80.6  MCH 26.4  --  25.4* 25.8*  MCHC 33.6  --  31.8 32.1  RDW 14.2  --  14.4 14.4  PLT 137*  --  128* 124*   Cardiac Enzymes Recent  Labs Lab 11/25/16 1358 11/25/16 1521 11/25/16 1945  TROPONINI <0.03 <0.03 <0.03    Recent Labs Lab 11/25/16 1101  TROPIPOC 0.00    BNPNo results for input(s): BNP, PROBNP in  the last 168 hours.  DDimer No results for input(s): DDIMER in the last 168 hours.  Radiology/Studies:  Dg Chest 2 View  Result Date: 11/25/2016 CLINICAL DATA:  Chest pain, shortness of Breath EXAM: CHEST  2 VIEW COMPARISON:  05/26/2015 FINDINGS: Prior CABG. Heart is borderline in size. Mild vascular congestion. No confluent opacities or effusions. No acute bony abnormality. IMPRESSION: Borderline heart size.  Vascular congestion. Electronically Signed   By: Rolm Baptise M.D.   On: 11/25/2016 11:28   Nm Myocar Multi W/spect W/wall Motion / Ef  Addendum Date: 11/26/2016    There were nonspecific T wave changes during the infusion  There is a medium defect of mild severity present in the basal inferior, mid inferior and apical inferior location. The defect is partially reversible. This could represent variations in diaphragmatic attenuation artifact but cannot rule out ischemia in the inferior wall.  The left ventricular ejection fraction is mildly decreased (45-54%).  Nuclear stress EF: 53%.  This is an intermediate risk study.    Result Date: 11/26/2016  There were nonspecific T wave changes during the infusion  Defect 1: There is a medium defect of mild severity present in the basal inferior, mid inferior and apical inferior location.  The left ventricular ejection fraction is mildly decreased (45-54%).  Nuclear stress EF: 53%.  This is a low risk study.     Assessment and Plan:   1. Chest pain with known CAD status post CABG 2001: His chest pain resolved with medications, he has not had recurrence of his pain while in the hospital. He had a nuclear stress test done yesterday which was of intermediate risk. He has had negative troponins and - 2. Hyperlipidemia:  LDL is not at target > 70 at 116 this admission, will need more aggressive statin tx. Increase Simvastatin to 40 mg.  3. Hypertension: Had significantly elevated blood pressures last night, systolic [id number], this morning it is  much more well controlled. -- continue Coreg 25 mg and Norvasc 10mg , Monitor closely.   4. AKI: Creatinine is 1.35 today, pt is not receiving ACE/ARB. Monitor this closely. Avoid nephrotoxic medications.    Kristopher Glee, PA-C  11/27/2016 9:02 AM   Patient seen and examined  I agree with findings as noted by Burr Medico above   Pt with known CAD  CABG in 2001 in [place] Followed by Linard Millers in clinic  Episode of chest discomfort x 2 at home  Fleeting  SOme numbness in bilateral fingers  New for him   No change in his abilitity to do thiings   No SOB  Here in Hospital has r/o for MI   Myovue scan yesterday   Inferior defect with soft tissue attenuation and possible mild ischemia   I have reviewed images  Compared to previous study  (some changes present then)  Pt has had not spells here in hosp, even wit hwalking in halls   On exam  BP high yesterday  Better today Neck  JVP normal Lungs CTA   No wheezes Cardiac RRR  No S3  No murmurs Abd bening Ext without edema  EKG  SR 62 bpm  Possible IWMI  Nonspecific ST T wave changes  Labs signif for  Hgb of 9.2  New  Pt has GI  appt this week.   Impression:   I am not convinced of unstable symptoms  Chest pain episode was vague Nuclear scan is difffiucult  Changes can be affected by diaphragmatic attenuation   ? Some ischemia  FOr now with anemia I would treat medically  Get control of BP  Pt should follow up with GI for anemia eval He has syptoms in R lower quandrant  USN of this region tday  Will make sure he has close f/u in clinic   2  HTN  Needs tighter control  3  HL  Needs tighter control  Pt OK to go home from cardiac standpoint  Return if symptoms return .    Dorris Carnes

## 2016-11-27 NOTE — Discharge Summary (Signed)
Physician Discharge Summary  Daniel Blevins GUY:[id number] DOB: 1960/09/22  PCP: Daniel Manes, Daniel Blevins  Admit date: 11/25/2016 Discharge date: 11/27/2016  Recommendations for Outpatient Follow-up:  1. Dr. Lajean Blevins, PCP in one week with repeat labs (CBC & BMP). 2. Dr. Daneen Blevins, Cardiology. Appointment has been made for 12/13/16 at 9:40 AM. 3. Dr. Otis Blevins, [facility]. Patient is advised to keep prior appointment that he has for 11/29/16 at 11:30 AM.  Home Health: None Equipment/Devices: None    Discharge Condition: Improved and stable  CODE STATUS: Full  Diet recommendation: Heart healthy diet.  Discharge Diagnoses:  Principal Problem:   Chest pain Active Problems:   History of gastric bypass   CAD (coronary artery disease) of artery bypass graft   Hyperlipidemia   Essential hypertension   Endoleak of aortic graft (HCC)   Anemia   AKI (acute kidney injury) ([place])   Obesity   Coronary artery disease involving native coronary artery of native heart without angina pectoris   Brief Summary: 56 year old male with PMH of CAD, remote CABG (2001), OSA, HTN, AAA repair, laparoscopic gastric bypass, doing MBA at [employer], mostly sedentary lifestyle, presented to ED with new onset of precordial chest pain that developed on the afternoon of admission, squeezing in nature, nonradiating, association with transient dyspnea but no diaphoresis, nausea or vomiting. Lasted maybe an hour or so and resolved after he received aspirin and NTG in the ED. No recurrence of chest pain. Admitted for observation by admitting team and nuclear stress test and echo requested. No initial cardiology consultation was requested. Nuclear stress test results reported as abnormal. Cardiology consulted and cleared him for discharge home.   Assessment & Plan:   1. Chest pain, atypical: Troponin 3 negative. EKG without acute changes. Cardiologist reading the stress test on 7/27 reported that it was  abnormal/intermediate risk and recommended formal cardiology consultation. Cardiology consultation today appreciated and indicate that his symptoms are not convincing for unstable symptoms. Chest pain episode was weak. Nuclear scan is difficult. Changes can be affected by diaphragmatic attenuation? Some ischemia. Recommend treating medically, improving blood pressure and lipid control control, outpatient GI consultation for anemia evaluation. Cardiology has cleared him for discharge home. Continue aspirin, carvedilol, statins. 2-D echo shows LVEF 60-65 percent, normal wall motion and no regional wall motion abnormalities, grade 1 diastolic dysfunction. No recurrence of chest pain. 2. Pancytopenia: Unclear etiology/? Related to B12 deficiency from gastric bypass. Anemia and thrombocytopenia appears chronic. Anemia panel results appreciated: B12: 204. Patient reports that he used to be on B12 oral supplements 1000 g daily and when his levels were normalized, he stopped taking them. He was advised last week by his PCP to resume the same oral B12 supplements. PCP also refer him to outpatient GI and patient has an appointment on 7/30 which she is advised to keep. He states that he had EGD and colonoscopy during his gastric bypass procedure approximately 8 years ago. CBC stable compared to yesterday. 3. Stage II chronic kidney disease: Creatinine in June 2017: 1.14. Presented with creatinine of 1.27. Creatinine increased slightly and plateaued at 1.3 range despite IV fluid hydration. This is likely his baseline. Renal ultrasound shows no hydronephrosis. Continue home dose of lisinopril. Close outpatient follow-up of BMP. 4. Essential hypertension: Uncontrolled. Carvedilol was changed from 50 mg daily to 25 MG twice a day, continue amlodipine 10 MG daily. Lisinopril which was held in the hospital will be resumed at discharge. Blood pressure better controlled today. 5. Hyperlipidemia: LDL 116.  Consider titrating statin  dose as outpatient. 6. CAD status post remote CABG: Management as indicated above. 7. 1.2 cm partially exophytic left renal lesion: Seen on renal ultrasound. Possibly cyst but incompletely characterized. Outpatient follow-up with PCP regarding further evaluation and management as deemed necessary.   Consultants:  Cardiology   Procedures:  2-D echo 11/26/16: Study Conclusions  - Left ventricle: The cavity size was normal. Wall thickness was increased in a pattern of moderate LVH. Systolic function was normal. The estimated ejection fraction was in the range of 60% to 65%. Wall motion was normal; there were no regional wall motion abnormalities. Doppler parameters are consistent with abnormal left ventricular relaxation (grade 1 diastolic dysfunction). The E/e&' ratio is between 8-15, suggesting indeterminate LV filling pressure. - Aorta: Dilated aorta. Aortic root dimension: 43 mm (ED). Ascending aortic diameter: 39 mm (S). - Left atrium: The atrium was normal in size. - Right atrium: The atrium was mildly dilated. - Inferior vena cava: The vessel was dilated. The respirophasic diameter changes were blunted (<50%), consistent with elevated central venous pressure.  Impressions:  - Compared to a prior echo in 2014, the LVEF has improved to 60-65%. The aorta is dilated - 4.3 cm at the root and 3.9 cm at the most distally visualized portion of the ascending aorta  Discharge Instructions  Discharge Instructions    Call Daniel Blevins for:  difficulty breathing, headache or visual disturbances    Complete by:  As directed    Call Daniel Blevins for:  extreme fatigue    Complete by:  As directed    Call Daniel Blevins for:  persistant dizziness or light-headedness    Complete by:  As directed    Call Daniel Blevins for:  severe uncontrolled pain    Complete by:  As directed    Diet - low sodium heart healthy    Complete by:  As directed    Increase activity slowly    Complete by:  As directed         Medication List    TAKE these medications   amLODipine 10 MG tablet Commonly known as:  NORVASC Take 10 mg by mouth daily.   aspirin 81 MG tablet Take 81 mg by mouth every morning.   buPROPion 300 MG 24 hr tablet Commonly known as:  WELLBUTRIN XL Take 300 mg by mouth daily.   carvedilol 25 MG tablet Commonly known as:  COREG Take 1 tablet (25 mg total) by mouth 2 (two) times daily with a meal. What changed:  how much to take  how to take this  when to take this  additional instructions   cyanocobalamin 1000 MCG tablet Take 1 tablet (1,000 mcg total) by mouth daily.   lisinopril 40 MG tablet Commonly known as:  PRINIVIL,ZESTRIL Take 1 tablet (40 mg total) by mouth every morning.   simvastatin 20 MG tablet Commonly known as:  ZOCOR Take 1 tablet (20 mg total) by mouth every morning.      Follow-up Information    Daniel Blevins, Hal, Daniel Blevins. Schedule an appointment as soon as possible for a visit in 1 week(s).   Specialty:  Internal Medicine Why:  To be seen with repeat labs (CBC & BMP). Contact information: [address] [place] [address] [phone]        Daniel Crome, Daniel Blevins Follow up.   Specialty:  Cardiology Why:  The office will call you early next week to arrange follow-up appointment. Contact information: [address]  [place] [address] [phone]        Daniel Brace, Daniel Blevins Follow up on 11/29/2016.   Specialty:  Gastroenterology Why:  11:30 AM. Keep prior appointment. Contact information: [place] [address] [phone]          No Known Allergies    Procedures/Studies: Dg Chest 2 View  Result Date: 11/25/2016 CLINICAL DATA:  Chest pain, shortness of Breath EXAM: CHEST  2 VIEW COMPARISON:  05/26/2015 FINDINGS: Prior CABG. Heart is borderline in size. Mild vascular congestion. No confluent opacities or effusions. No acute bony abnormality. IMPRESSION:  Borderline heart size.  Vascular congestion. Electronically Signed   By: Daniel Baptise M.D.   On: 11/25/2016 11:28   US Renal  Result Date: 11/27/2016 CLINICAL DATA:  Chronic kidney disease EXAM: RENAL / URINARY TRACT ULTRASOUND COMPLETE COMPARISON:  CT 04/13/2016 FINDINGS: Right Kidney: Length: 12.1 cm. Echogenicity within normal limits. No mass or hydronephrosis visualized. Left Kidney: Length: 13.1 cm. There is an exophytic 0.9 x 0.7 x 1.2 cm hypoechoic lesion from the interpolar region, corresponding to a nonspecific 1.1 cm low-attenuation lesion on recent CT. Parenchymal Echogenicity within normal limits. No hydronephrosis visualized. Bladder: Appears normal for degree of bladder distention. IMPRESSION: 1. Negative for hydronephrosis. 2. 1.2 cm partially exophytic left renal lesion possibly cyst but incompletely characterized. Attention on follow-up imaging recommended. Electronically Signed   By: Daniel Europe M.D.   On: 11/27/2016 09:58   Nm Myocar Multi W/spect W/wall Motion / Ef  Addendum Date: 11/26/2016    There were nonspecific T wave changes during the infusion  There is a medium defect of mild severity present in the basal inferior, mid inferior and apical inferior location. The defect is partially reversible. This could represent variations in diaphragmatic attenuation artifact but cannot rule out ischemia in the inferior wall.  The left ventricular ejection fraction is mildly decreased (45-54%).  Nuclear stress EF: 53%.  This is an intermediate risk study.    Result Date: 11/26/2016  There were nonspecific T wave changes during the infusion  Defect 1: There is a medium defect of mild severity present in the basal inferior, mid inferior and apical inferior location.  The left ventricular ejection fraction is mildly decreased (45-54%).  Nuclear stress EF: 53%.  This is a low risk study.       Subjective: No further chest pain since hospital admission even with activity such as  walking in the halls. No other complaints reported.  Discharge Exam:  Vitals:   11/26/16 2001 11/27/16 0023 11/27/16 0537 11/27/16 1230  BP: 118/83 117/73 115/81 126/84  Pulse: (!) 59 64 (!) 59 63  Resp: 18  19 18   Temp: 98.4 F (36.9 C) 98.5 F (36.9 C) 98.7 F (37.1 C) 98.9 F (37.2 C)  TempSrc: Oral Oral Oral Oral  SpO2: 95% 93% 94% 97%  Weight:   118.2 kg (260 lb 9.6 oz)   Height:        General exam: Pleasant young male seen ambulating comfortably in the halls. Respiratory system: Clear to auscultation. Respiratory effort normal. Cardiovascular system: S1 & S2 heard, RRR. No JVD, murmurs, rubs, gallops or clicks. No pedal edema. Telemetry: Sinus rhythm - SB in the 50's. Gastrointestinal system: Abdomen is nondistended, soft and nontender. No organomegaly or masses felt. Normal bowel sounds heard. Central nervous system: Alert and oriented. No focal neurological deficits. Extremities: Symmetric 5 x 5 power. Skin: No rashes, lesions or ulcers Psychiatry: Judgement and insight appear  normal. Mood & affect appropriate.     The results of significant diagnostics from this hospitalization (including imaging, microbiology, ancillary and laboratory) are listed below for reference.       Labs: CBC:  Recent Labs Lab 11/25/16 1046 11/26/16 0325 11/27/16 0511  WBC 4.1 3.9* 3.6*  HGB 9.6* 8.9* 9.2*  HCT 28.6* 28.0* 28.7*  MCV 78.8 80.0 80.6  PLT 137* 128* 660*   Basic Metabolic Panel:  Recent Labs Lab 11/25/16 1046 11/26/16 0325 11/27/16 0511  NA 136 138 138  K 4.3 4.0 4.0  CL 104 105 107  CO2 23 24 24   GLUCOSE 123* 102* 98  BUN 12 15 17   CREATININE 1.27* 1.36* 1.35*  CALCIUM 8.6* 8.3* 8.2*   Cardiac Enzymes:  Recent Labs Lab 11/25/16 1358 11/25/16 1521 11/25/16 1945  TROPONINI <0.03 <0.03 <0.03   Lipid Profile  Recent Labs  11/25/16 1349  CHOL 204*  HDL 74  LDLCALC 116*  TRIG 68  CHOLHDL 2.8   Anemia work up  Recent Labs   11/25/16 1349 11/25/16 1358  VITAMINB12  --  204  FOLATE 7.9  --   FERRITIN  --  214  TIBC  --  347  IRON  --  113  RETICCTPCT  --  1.3       Time coordinating discharge: Over 30 minutes  SIGNED:  Vernell Leep, Daniel Blevins, FACP, FHM. [facility] Pager [phone]  If 7PM-7AM, please contact night-coverage [email or web address] Password [email or web address] 11/27/2016, 2:01 PM

## 2016-11-27 NOTE — Progress Notes (Signed)
Patient discharged home with spouse. IV removed and discharge instructions given. No questions at this time.

## 2016-11-27 NOTE — Discharge Instructions (Signed)

## 2016-12-12 NOTE — Progress Notes (Signed)
Cardiology Office Note    Date:  12/13/2016   ID:  Daniel Blevins, DOB 08/24/1960, MRN [id number]  PCP:  Lajean Manes, MD  Cardiologist: Sinclair Grooms, MD   Chief Complaint  Patient presents with  . Coronary Artery Disease    History of Present Illness:  Daniel Blevins is a 55 y.o. male for follow-up of coronary artery disease with bypass surgery 2001, history of abdominal aortic aneurysm treated with EVAR 2015, and subsequent repeat procedures for endoleak. Recent emergency room visit an overnight hospital stay for chest pain. Low risk myocardial perfusion study.  Recently hospitalized after developing mild tightness in the chest, diaphoresis, and feeling somewhat weak. This occurred in the midst of typical activities. 911 was called and he was taken to the hospital. Evaluation was done including an echocardiogram and myocardial perfusion study. The echo demonstrated normal overall LV systolic function with EF of 60-65% the myocardial perfusion imaging was low risk. There was decreased uptake in the inferior lateral regions felt to represent diaphragm attenuation.  Since discharge from the hospital he is done well over the last 10 days. Back to typical activity without symptoms.  Past Medical History:  Diagnosis Date  . AKI (acute kidney injury) ([place])   . Chest pain   . Coronary artery disease   . Deafness in right ear   . Depression   . Dysrhythmia   . 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 History of kidney stones   . Hx of CABG   . Hypertension   . Sleep apnea   . Urinary frequency     Past Surgical History:  Procedure Laterality Date  . ABDOMINAL AORTIC ENDOVASCULAR STENT GRAFT N/A 04/30/2014   Procedure: ABDOMINAL AORTIC ENDOVASCULAR STENT GRAFT;  Surgeon: Elam Dutch, MD;  [facility] OR;  Service: Vascular;  Laterality: N/A;  . ABDOMINAL AORTIC ENDOVASCULAR STENT GRAFT N/A 05/26/2015   Procedure: ABDOMINAL AORTIC ENDOVASCULAR STENT GRAFT RE-INTERVENTION;  REPAIR OF PROXIMAL CUFF;  Surgeon: Elam Dutch, MD;  Location: [facility];  Service: Vascular;  Laterality: N/A;  . BACK SURGERY  2009   lower back  . CARDIAC CATHETERIZATION    . COLONOSCOPY    . CORONARY ARTERY BYPASS GRAFT  2001  . CYSTOSCOPY WITH RETROGRADE PYELOGRAM, URETEROSCOPY AND STENT PLACEMENT Bilateral 02/07/2014   Procedure: CYSTOSCOPY WITH BILATERAL RETROGRADE PYELOGRAM, BILATERAL URETEROSCOPY, RIGHT EXTRACTION OF STONES AND RIGHT STENT PLACEMENT;  Surgeon: Jorja Loa, MD;  Location: [facility];  Service: Urology;  Laterality: Bilateral;  . ESOPHAGOGASTRODUODENOSCOPY    . IR GENERIC HISTORICAL  11/06/2015   IR RADIOLOGIST EVAL & MGMT 11/06/2015 Jacqulynn Cadet, MD [facility] INTERV RAD  . IR GENERIC HISTORICAL  04/20/2016   IR RADIOLOGIST EVAL & MGMT 04/20/2016 Jacqulynn Cadet, MD [facility] INTERV RAD  . LAPAROSCOPIC GASTRIC BYPASS  2010   [facility]  . LAPAROSCOPIC LYSIS OF ADHESIONS N/A 03/06/2013   Procedure: LAPAROSCOPIC LYSIS OF ADHESIONS AND CLOSURE OF INTERNAL HERNIAS x2;  Surgeon: Adin Hector, MD;  Location: [facility];  Service: General;  Laterality: N/A;  . LAPAROSCOPY N/A 03/06/2013   Procedure: LAPAROSCOPY DIAGNOSTIC;  Surgeon: Adin Hector, MD;  Location: [facility];  Service: General;  Laterality: N/A;  . PERIPHERAL VASCULAR CATHETERIZATION Right 04/30/2014   Procedure: EMBOLIZATION right internal iliac;  Surgeon: Elam Dutch, MD;  Location: [facility];  Service: Vascular;  Laterality: Right;    Current Medications: Outpatient Medications Prior to Visit  Medication Sig Dispense Refill  . amLODipine (  NORVASC) 10 MG tablet Take 10 mg by mouth daily.    Marland Kitchen aspirin 81 MG tablet Take 81 mg by mouth every morning.     Marland Kitchen buPROPion (WELLBUTRIN XL) 300 MG 24 hr tablet Take 300 mg by mouth daily.    . carvedilol (COREG) 25 MG tablet Take 1 tablet (25 mg total) by mouth 2 (two) times daily with a meal. 60 tablet 0  . lisinopril (PRINIVIL,ZESTRIL) 40 MG tablet Take 1 tablet  (40 mg total) by mouth every morning. 90 tablet 3  . simvastatin (ZOCOR) 20 MG tablet Take 1 tablet (20 mg total) by mouth every morning. 90 tablet 3  . vitamin B-12 1000 MCG tablet Take 1 tablet (1,000 mcg total) by mouth daily. 30 tablet 0   No facility-administered medications prior to visit.      Allergies:   Patient has no known allergies.   Social History   Social History  . Marital status: Married    Spouse name: N/A  . Number of children: N/A  . Years of education: N/A   Social History Main Topics  . Smoking status: Never Smoker  . Smokeless tobacco: Never Used  . Alcohol use Yes  . Drug use: No  . Sexual activity: No   Other Topics Concern  . None   Social History Narrative  . None     Family History:  The patient's family history includes AAA (abdominal aortic aneurysm) in his father; Cancer in his mother; Heart disease in his brother and father; Lung cancer in his mother.   ROS:   Please see the history of present illness.    Decreased hearing, abdominal discomfort,  All other systems reviewed and are negative.   PHYSICAL EXAM:   VS:  BP 134/88 (BP Location: Left Arm)   Pulse 63   Ht 5\' 11"  (1.803 m)   Wt 262 lb 6.4 oz (119 kg)   BMI 36.60 kg/m    GEN: Well nourished, well developed, in no acute distress  HEENT: normal  Neck: no JVD, carotid bruits, or masses Cardiac: RRR; no murmurs, rubs, or gallops,no edema  Respiratory:  clear to auscultation bilaterally, normal work of breathing GI: soft, nontender, nondistended, + BS MS: no deformity or atrophy  Skin: warm and dry, no rash Neuro:  Alert and Oriented x 3, Strength and sensation are intact Psych: euthymic mood, full affect  Wt Readings from Last 3 Encounters:  12/13/16 262 lb 6.4 oz (119 kg)  11/27/16 260 lb 9.6 oz (118.2 kg)  04/20/16 267 lb (121.1 kg)      Studies/Labs Reviewed:   EKG:  EKG  Not repeated. The electrocardiogram during complaint on 11/26/2016 was unremarkable.  Recent  Labs: 11/27/2016: BUN 17; Creatinine, Ser 1.35; Hemoglobin 9.2; Platelets 124; Potassium 4.0; Sodium 138   Lipid Panel    Component Value Date/Time   CHOL 204 (H) 11/25/2016 1349   TRIG 68 11/25/2016 1349   HDL 74 11/25/2016 1349   CHOLHDL 2.8 11/25/2016 1349   VLDL 14 11/25/2016 1349   LDLCALC 116 (H) 11/25/2016 1349    Additional studies/ records that were reviewed today include:  Myocardial perfusion imaging 11/26/16: Addendum   There were nonspecific T wave changes during the infusion  There is a medium defect of mild severity present in the basal inferior, mid inferior and apical inferior location. The defect is partially reversible. This could represent variations in diaphragmatic attenuation artifact but cannot rule out ischemia in the inferior wall.  The  left ventricular ejection fraction is mildly decreased (45-54%).  Nuclear stress EF: 53%.  This is an intermediate risk study.   2-D Doppler echocardiogram 11/26/2016: Study Conclusions   - Left ventricle: The cavity size was normal. Wall thickness was   increased in a pattern of moderate LVH. Systolic function was   normal. The estimated ejection fraction was in the range of 60%   to 65%. Wall motion was normal; there were no regional wall   motion abnormalities. Doppler parameters are consistent with   abnormal left ventricular relaxation (grade 1 diastolic   dysfunction). The E/e&' ratio is between 8-15, suggesting   indeterminate LV filling pressure. - Aorta: Dilated aorta. Aortic root dimension: 43 mm (ED).   Ascending aortic diameter: 39 mm (S). - Left atrium: The atrium was normal in size. - Right atrium: The atrium was mildly dilated. - Inferior vena cava: The vessel was dilated. The respirophasic   diameter changes were blunted (< 50%), consistent with elevated   central venous pressure.   Impressions:   - Compared to a prior echo in 2014, the LVEF has improved to   60-65%. The aorta is dilated - 4.3 cm  at the root and 3.9 cm at   the most distally visualized portion of the ascending aorta.   ASSESSMENT:    1. Chest pain, unspecified type   2. Essential hypertension   3. Endoleak post endovascular aneurysm repair, initial encounter ([place])   4. Coronary artery disease involving coronary bypass graft of native heart with unstable angina pectoris ([place])      PLAN:  In order of problems listed above:  1. Uncertain etiology but cannot totally exclude the possibility of myocardial ischemia and possibly even graft occlusion with subsequent reestablishment of antegrade flow or collaterals to a relatively small vascular territory. In the absence of recurrent symptoms, we will simply follow the patient clinically. I've asked him to resume typical physical activity. If recurrent episodes of similar chest discomfort, he may need to have coronary angiography. 2. Excellent control. No change in therapy needed. 3. Has routine follow-up with vascular surgery. 4. Prior coronary bypass grafting performed    Medication Adjustments/Labs and Tests Ordered: Current medicines are reviewed at length with the patient today.  Concerns regarding medicines are outlined above.  Medication changes, Labs and Tests ordered today are listed in the Patient Instructions below. Patient Instructions  Medication Instructions:  None  Labwork: None  Testing/Procedures: None  Follow-Up: Your physician wants you to follow-up in: 1 year with Dr. Tamala Julian.  You will receive a reminder letter in the mail two months in advance. If you don't receive a letter, please call our office to schedule the follow-up appointment.   Any Other Special Instructions Will Be Listed Below (If Applicable).  Your physician discussed the importance of regular exercise and recommended that you start or continue a regular exercise program for good health.    If you need a refill on your cardiac medications before your next appointment, please  call your pharmacy.      Signed, Sinclair Grooms, MD  12/13/2016 11:10 AM    [facility] [place], [place], [place]  [address] Phone: [phone]; Fax: [phone]

## 2016-12-13 ENCOUNTER — Encounter: Payer: Self-pay | Admitting: Interventional Cardiology

## 2016-12-13 ENCOUNTER — Ambulatory Visit (INDEPENDENT_AMBULATORY_CARE_PROVIDER_SITE_OTHER): Payer: 59 | Admitting: Interventional Cardiology

## 2016-12-13 VITALS — BP 134/88 | HR 63 | Ht 71.0 in | Wt 262.4 lb

## 2016-12-13 DIAGNOSIS — IMO0001 Reserved for inherently not codable concepts without codable children: Secondary | ICD-10-CM

## 2016-12-13 DIAGNOSIS — R079 Chest pain, unspecified: Secondary | ICD-10-CM | POA: Diagnosis not present

## 2016-12-13 DIAGNOSIS — I25700 Atherosclerosis of coronary artery bypass graft(s), unspecified, with unstable angina pectoris: Secondary | ICD-10-CM

## 2016-12-13 DIAGNOSIS — I10 Essential (primary) hypertension: Secondary | ICD-10-CM | POA: Diagnosis not present

## 2016-12-13 DIAGNOSIS — T82330A Leakage of aortic (bifurcation) graft (replacement), initial encounter: Secondary | ICD-10-CM

## 2016-12-13 NOTE — Patient Instructions (Signed)
Medication Instructions:  None  Labwork: None  Testing/Procedures: None  Follow-Up: Your physician wants you to follow-up in: 1 year with Dr. Smith. You will receive a reminder letter in the mail two months in advance. If you don't receive a letter, please call our office to schedule the follow-up appointment.   Any Other Special Instructions Will Be Listed Below (If Applicable).  Your physician discussed the importance of regular exercise and recommended that you start or continue a regular exercise program for good health.    If you need a refill on your cardiac medications before your next appointment, please call your pharmacy.   

## 2017-03-06 IMAGING — XA IR ANGIO/ADD [PERSON_NAME]
1 series · 11 of 24 positions shown · IV contrast (IODINE)
Comparison: none

CLINICAL DATA: 54-year-old male with an enlarging aneurysm sac
secondary to endoleak despite endovascular aortic repair. He
presents for diagnostic angiography to delineate type 1a from type 2
contributions. If the endoleak is type 2 he will undergo attempted
endovascular repair. Additionally, we plan to interrogate the left
internal iliac artery to evaluate for a possible arteriovenous
malformation in the left gluteal musculature.
TECHNIQUE: Informed consent was obtained from the patient following explanation
of the procedure, risks, benefits and alternatives. The patient
understands, agrees and consents for the procedure. All questions
were addressed. A time out was performed.

[Series 300: ir embo arterial not (person_name) inc g · arterial · 11 of 120 slices shown]
[im 6/120]
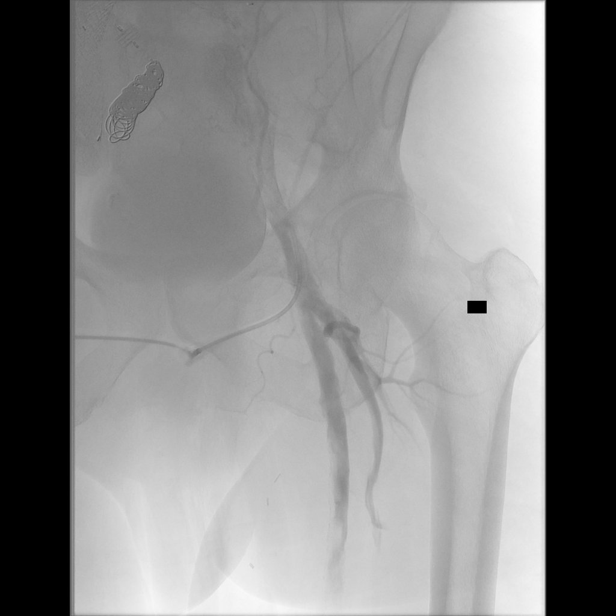
[im 16/120]
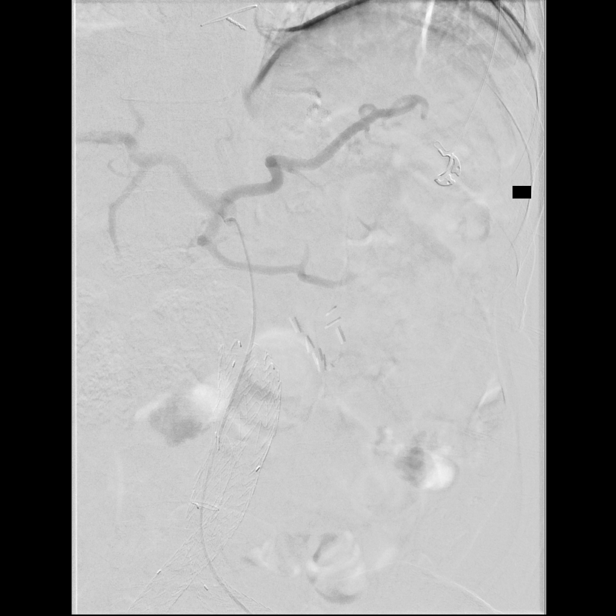
[im 26/120]
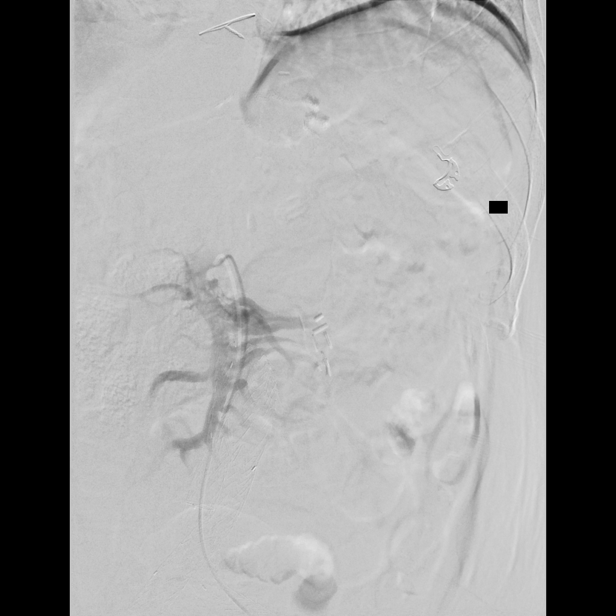
[im 37/120]
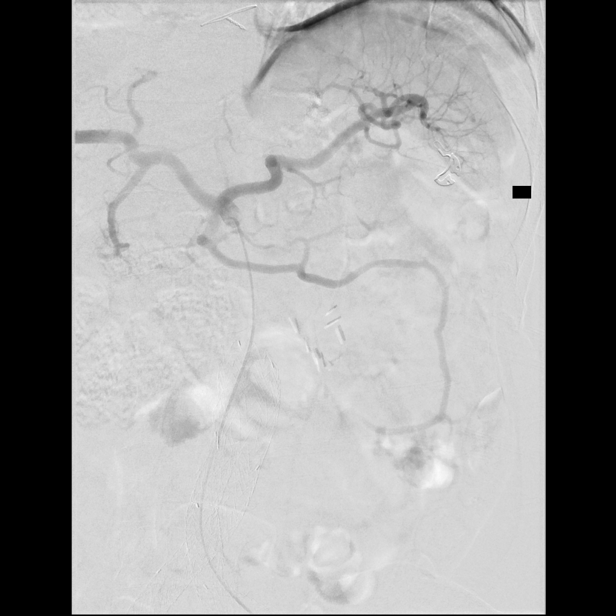
[im 47/120]
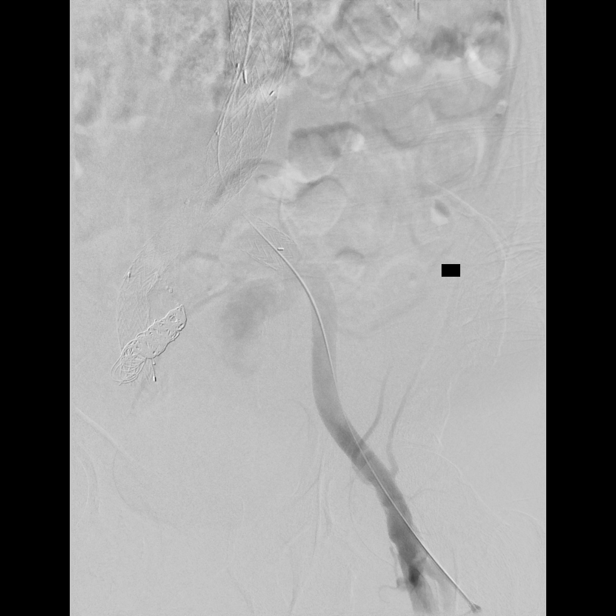
[im 63/120]
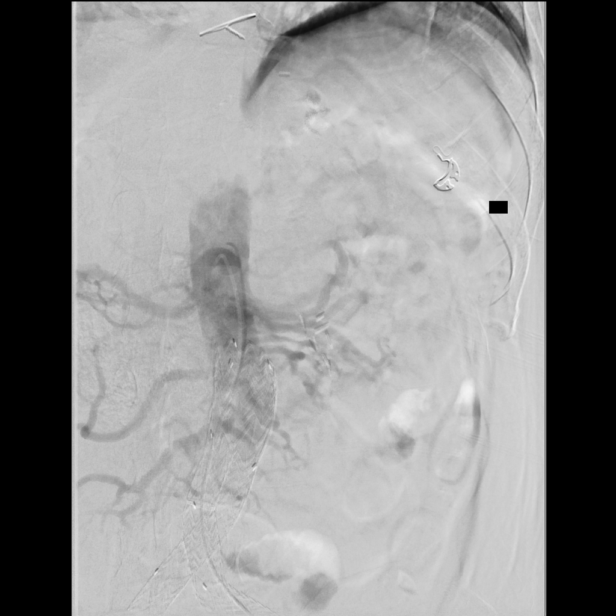
[im 73/120]
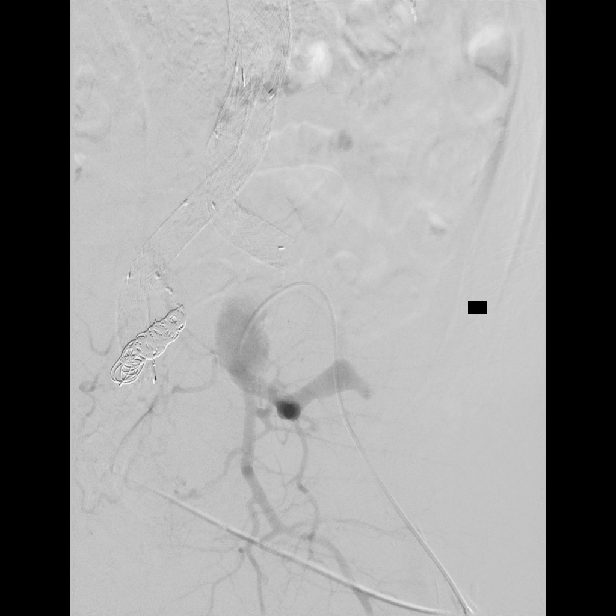
[im 83/120]
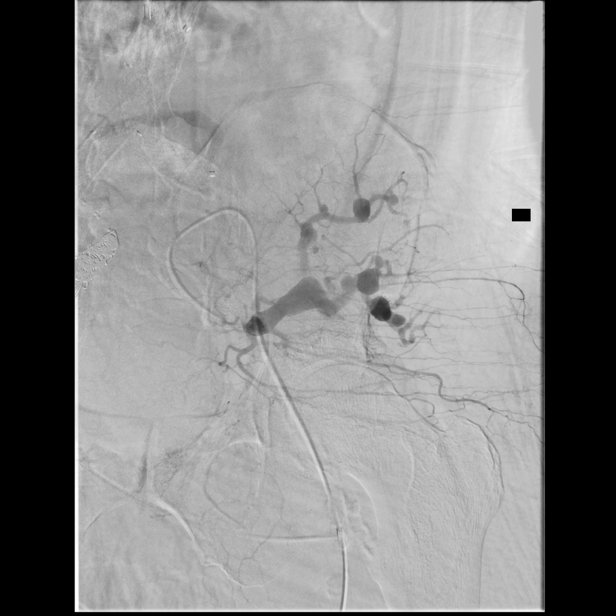
[im 94/120]
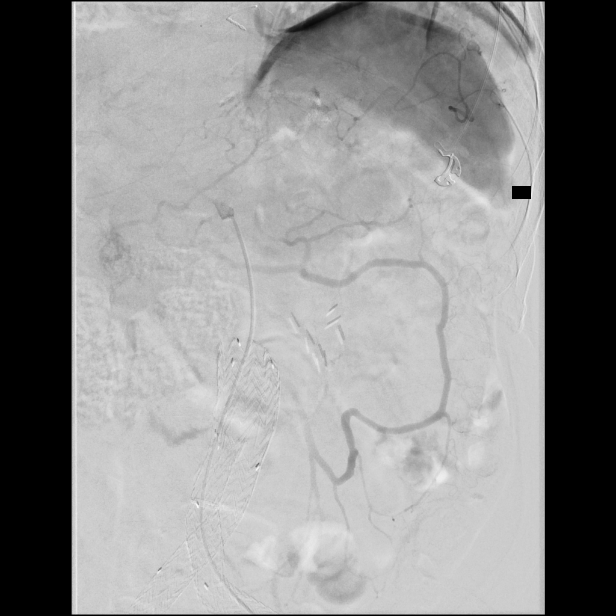
[im 104/120]
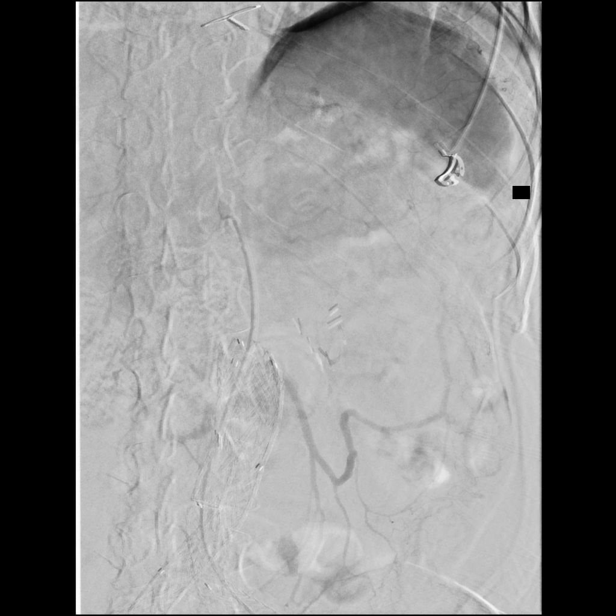
[im 114/120]
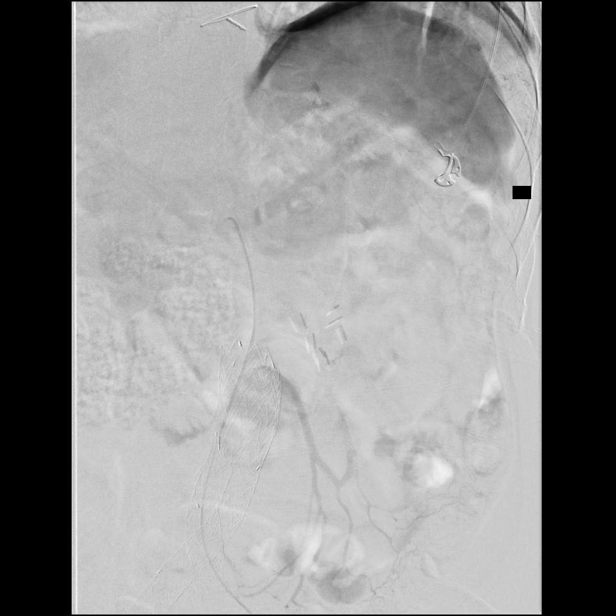

[11 of 24 positions shown; findings below may reference images not displayed]

EXAM:
SELECTIVE VISCERAL ARTERIOGRAPHY; PELVIC SELECTIVE ARTERIOGRAPHY; IR
ULTRASOUND GUIDANCE VASC ACCESS LEFT; ADDITIONAL ARTERIOGRAPHY

Date: 05/20/2015

PROCEDURE:
1. Ultrasound-guided puncture left common femoral artery
2. Aortic catheterization and aortogram
3. Celiac artery catheterization with arteriogram
4. Superior mesenteric artery catheterization with arteriogram
5. Left internal iliac artery catheterization with arteriogram
6. Left superior gluteal artery catheterization with arteriogram

ANESTHESIA/SEDATION:
Moderate (conscious) sedation was used. 2 mg Versed, 100 mcg
Fentanyl were administered intravenously. The patient's vital signs
were monitored continuously by radiology nursing throughout the
procedure.

Sedation Time: 65 minutes

MEDICATIONS:
None additional

FLUOROSCOPY TIME:  7 minutes 36 seconds for a total of 924 mGy

CONTRAST:  100mL OMNIPAQUE IOHEXOL 300 MG/ML  SOLN
Maximal barrier sterile technique utilized including caps, mask,
sterile gowns, sterile gloves, large sterile drape, hand hygiene,
and Betadine skin prep.

The left groin was interrogated with ultrasound. The common femoral
artery is ectatic but otherwise widely patent and unremarkable.
Image was obtained and stored for the medical record. Local
anesthesia was attained by infiltration with 1% lidocaine. A small
dermatotomy was made. Under real-time sonographic guidance, the
common femoral artery was punctured with a 21 gauge micropuncture
needle. Using standard technique, the needle was exchanged over a
micro wire for a transitional 4 French micro sheath. The micro
sheath was then exchanged over a Bentson wire for a working 5 French
vascular sheath.

A pigtail catheter was advanced in the abdominal aorta and an
arteriogram performed at the level of the renal arteries. The graft
is inferior to the renal arteries and tilted slightly leftward
resulting in Marjonas gapping along the right proximal seal zone. The
graft is approximately 1.8 cm below the left main renal artery and 2
cm below the right main renal artery. Of note, there is an accessory
right-sided renal artery supplying the lower pole. This [REDACTED]% of the total renal volume. There is early filling of the
aneurysm sac consistent with a type 1A endoleak. The distal seal
zones are patent. No evidence of type 1 be endoleak. The left
internal iliac artery is aneurysmal and measures up to 2.5 cm in
diameter.

The pigtail catheter was exchanged over a wire for a C2 Cobra
catheter which was then used to select the celiac artery. A celiac
arteriogram was performed. The middle colic artery is replaced to
the celiac artery. Via the marginal artery of Herz there is a
direct communication with the inferior mesenteric artery. The
inferior mesenteric artery originates from the aorta at the top of
the seal zone. There is evidence of a type 2 endoleak in the same
region as the type 1A endoleak.

The C2 cobra catheter was used to select the right internal iliac
artery. An internal iliac arteriogram was performed. The superior
gluteal artery is ectatic with multiple aneurysms distally. With the
assistance of a Glidewire, the C2 cobra catheter was successfully
navigated into the left superior gluteal artery. An arteriogram was
performed. The main body of the superior gluteal artery is ectatic
measuring up to 1.3 cm. There are multifocal aneurysmal dilatations
of the more distal branches. At least 10 small aneurysms are
identified the largest of which measures 1.2 cm. There is no
evidence of early draining vein, or arterial venous malformation.

Finally, a right external iliac arteriogram was performed to fully
assess the profunda femoral artery and its branches. There is no
evidence of stenosis of the proximal profunda femoral arteries or
branch vessels.

The sheath was removed and hemostasis attained with the assistance
of a Cordis Exoseal device.

COMPLICATIONS:
None

Estimated blood loss:  0
IMPRESSION: 1. Diagnostic angiography is positive for a type 1A endoleak at the
proximal seal zone.
2. Accessory right renal artery supplying the lower pole of the
right kidney (approximately 15- 20% total right renal parenchymal
volume).
3. The middle colic artery is replaced to the celiac artery and
there is a robust marginal artery of Herz communicating with the
inferior mesenteric artery and then retrograde into the aneurysm sac
consistent with a superimposed type 2 endoleak. This overlaps with
the type 1A endoleak described above.
4. Numerous aneurysms arising from the distal branches of the left
superior gluteal artery corresponding with the vascular abnormality
seen on the prior CTA. There is no evidence of arterial venous
fistula or arterial venous malformation in this region. Of note,
flow is relatively slow in the aneurysmal internal iliac artery and
throughout the gluteal artery aneurysms.
5. No evidence of hemodynamically significant stenosis involving the
profunda femoral artery or its branches.

PLAN:
Findings discussed and reviewed with Dr. Dr-Maher in person at the
time of the procedure. He plans to proceed with proximal extension
to seal the type 1A endoleak.

On follow-up imaging, if there is a persistent type 2 endoleak
originating from the inferior mesenteric artery with continued sac
growth then this gentleman would be amenable to endovascular repair
as was initially planned for today.

## 2017-03-09 ENCOUNTER — Other Ambulatory Visit: Payer: Self-pay | Admitting: Interventional Cardiology

## 2017-03-09 DIAGNOSIS — IMO0001 Reserved for inherently not codable concepts without codable children: Secondary | ICD-10-CM

## 2017-03-09 DIAGNOSIS — T82330A Leakage of aortic (bifurcation) graft (replacement), initial encounter: Secondary | ICD-10-CM

## 2017-03-09 DIAGNOSIS — E785 Hyperlipidemia, unspecified: Secondary | ICD-10-CM

## 2017-03-09 DIAGNOSIS — R55 Syncope and collapse: Secondary | ICD-10-CM

## 2017-03-09 DIAGNOSIS — I25701 Atherosclerosis of coronary artery bypass graft(s), unspecified, with angina pectoris with documented spasm: Secondary | ICD-10-CM

## 2017-03-09 DIAGNOSIS — I10 Essential (primary) hypertension: Secondary | ICD-10-CM

## 2017-03-12 IMAGING — CR DG CHEST 1V PORT
1 series · 1 of 1 positions shown · non-contrast
Comparison: April 30, 2014

CLINICAL DATA: Status post abdominal aortic aneurysm repair

EXAM:
PORTABLE CHEST 1 VIEW

[AP]
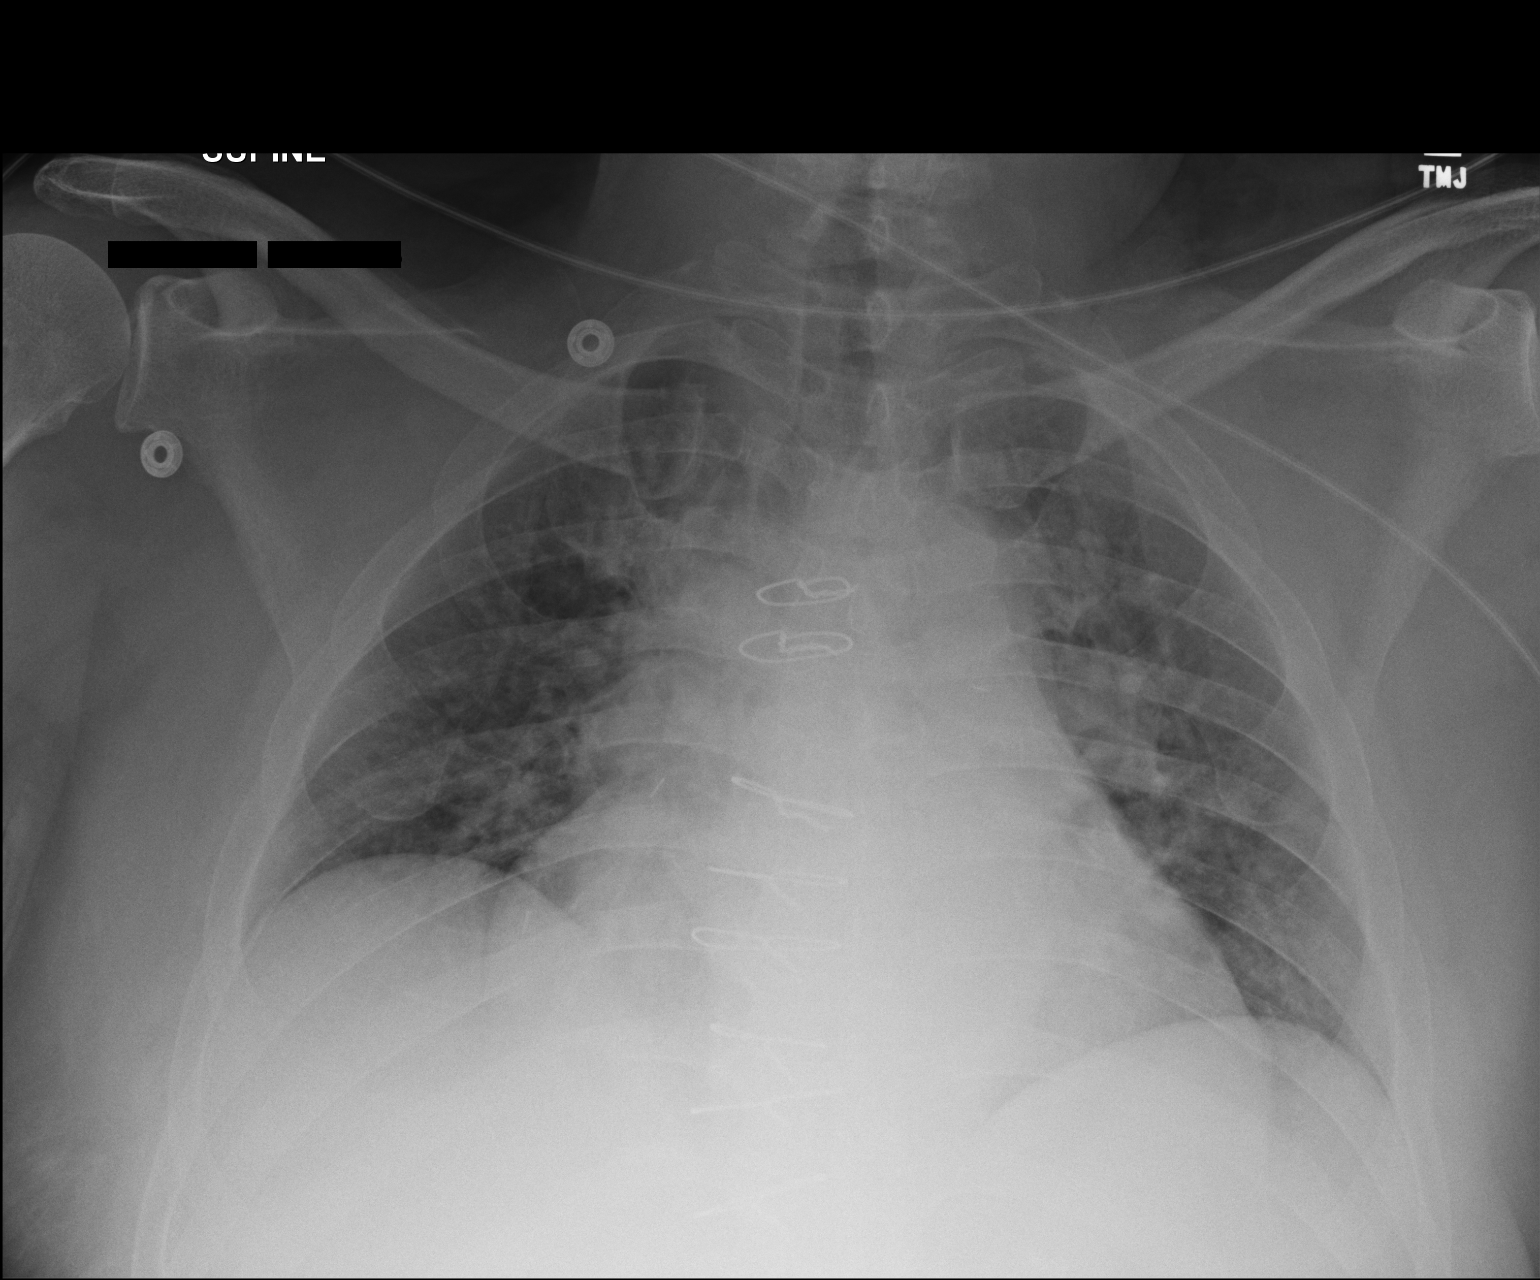

[1 of 1 positions shown; findings below may reference images not displayed]

FINDINGS: There is no edema or consolidation. Heart is enlarged with pulmonary
vascularity within normal limits. No adenopathy. Patient is status
post median sternotomy.
IMPRESSION: Stable cardiomegaly.  No edema or consolidation.

## 2017-04-20 ENCOUNTER — Other Ambulatory Visit: Payer: Self-pay | Admitting: Gastroenterology

## 2017-04-20 DIAGNOSIS — R1031 Right lower quadrant pain: Secondary | ICD-10-CM

## 2017-04-20 DIAGNOSIS — R1011 Right upper quadrant pain: Secondary | ICD-10-CM

## 2017-04-22 ENCOUNTER — Other Ambulatory Visit: Payer: 59

## 2017-04-25 ENCOUNTER — Ambulatory Visit
Admission: RE | Admit: 2017-04-25 | Discharge: 2017-04-25 | Disposition: A | Discharge status: Home / Self Care | Payer: 59 | Source: Ambulatory Visit | Attending: Gastroenterology | Admitting: Gastroenterology

## 2017-04-25 ENCOUNTER — Other Ambulatory Visit (HOSPITAL_COMMUNITY): Payer: Self-pay | Admitting: Interventional Radiology

## 2017-04-25 DIAGNOSIS — R1011 Right upper quadrant pain: Secondary | ICD-10-CM

## 2017-04-25 DIAGNOSIS — I714 Abdominal aortic aneurysm, without rupture, unspecified: Secondary | ICD-10-CM

## 2017-04-25 DIAGNOSIS — R1031 Right lower quadrant pain: Secondary | ICD-10-CM

## 2017-04-25 MED ORDER — IOPAMIDOL (ISOVUE-300) INJECTION 61%
125.0000 mL | Freq: Once | INTRAVENOUS | Status: AC | PRN
  Administered 2017-04-25: 125 mL via INTRAVENOUS

## 2017-05-11 ENCOUNTER — Other Ambulatory Visit (HOSPITAL_COMMUNITY): Payer: Self-pay | Admitting: Interventional Radiology

## 2017-05-11 ENCOUNTER — Ambulatory Visit
Admission: RE | Admit: 2017-05-11 | Discharge: 2017-05-11 | Disposition: A | Discharge status: Home / Self Care | Payer: 59 | Source: Ambulatory Visit | Attending: Interventional Radiology | Admitting: Interventional Radiology

## 2017-05-11 ENCOUNTER — Encounter: Payer: Self-pay | Admitting: *Deleted

## 2017-05-11 ENCOUNTER — Other Ambulatory Visit: Payer: Self-pay | Admitting: Interventional Radiology

## 2017-05-11 ENCOUNTER — Other Ambulatory Visit: Payer: Self-pay | Admitting: *Deleted

## 2017-05-11 DIAGNOSIS — I714 Abdominal aortic aneurysm, without rupture, unspecified: Secondary | ICD-10-CM

## 2017-05-11 DIAGNOSIS — I724 Aneurysm of artery of lower extremity: Secondary | ICD-10-CM

## 2017-05-11 DIAGNOSIS — Z951 Presence of aortocoronary bypass graft: Secondary | ICD-10-CM

## 2017-05-11 DIAGNOSIS — I729 Aneurysm of unspecified site: Secondary | ICD-10-CM

## 2017-05-11 DIAGNOSIS — IMO0001 Reserved for inherently not codable concepts without codable children: Secondary | ICD-10-CM

## 2017-05-11 DIAGNOSIS — I671 Cerebral aneurysm, nonruptured: Secondary | ICD-10-CM

## 2017-05-11 DIAGNOSIS — T82330D Leakage of aortic (bifurcation) graft (replacement), subsequent encounter: Secondary | ICD-10-CM

## 2017-05-11 HISTORY — PX: IR RADIOLOGIST EVAL & MGMT: IMG5224

## 2017-05-11 NOTE — Progress Notes (Signed)
Chief Complaint: Patient was seen in consultation today for  Chief Complaint  Patient presents with  . Follow-up    Follow up Endoleak Repair   at the request of McCullough,Heath  Referring Physician(s): McCullough,Heath  History of Present Illness: Daniel Blevins is a 57 y.o. male who underwent successful embolization of aortic endoleak back in 2017. He has been doing well. He's recently had some abd pain and his GI ordered a new CT ca of his abdomen and there have been some changes with his aneurysm, so we have asked him to come in today to discuss. He's otherwise been doing well. BP well controlled and he remains on Zocor. He is not a diabetic, nor smoker. He is active and walks regularly. Denies headaches, vision changes. Denies LE pain or claudication.  Past Medical History:  Diagnosis Date  . AKI (acute kidney injury) ([place])   . Chest pain   . Coronary artery disease   . Deafness in right ear   . Depression   . Dysrhythmia   . 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 History of kidney stones   . Hx of CABG   . Hypertension   . Sleep apnea   . Urinary frequency     Past Surgical History:  Procedure Laterality Date  . ABDOMINAL AORTIC ENDOVASCULAR STENT GRAFT N/A 04/30/2014   Procedure: ABDOMINAL AORTIC ENDOVASCULAR STENT GRAFT;  Surgeon: Elam Dutch, MD;  Location: [facility] OR;  Service: Vascular;  Laterality: N/A;  . ABDOMINAL AORTIC ENDOVASCULAR STENT GRAFT N/A 05/26/2015   Procedure: ABDOMINAL AORTIC ENDOVASCULAR STENT GRAFT RE-INTERVENTION; REPAIR OF PROXIMAL CUFF;  Surgeon: Elam Dutch, MD;  Location: [place];  Service: Vascular;  Laterality: N/A;  . BACK SURGERY  2009   lower back  . CARDIAC CATHETERIZATION    . COLONOSCOPY    . CORONARY ARTERY BYPASS GRAFT  2001  . CYSTOSCOPY WITH RETROGRADE PYELOGRAM, URETEROSCOPY AND STENT PLACEMENT Bilateral 02/07/2014   Procedure: CYSTOSCOPY WITH BILATERAL RETROGRADE PYELOGRAM, BILATERAL URETEROSCOPY, RIGHT EXTRACTION  OF STONES AND RIGHT STENT PLACEMENT;  Surgeon: Jorja Loa, MD;  Location: [facility];  Service: Urology;  Laterality: Bilateral;  . ESOPHAGOGASTRODUODENOSCOPY    . IR GENERIC HISTORICAL  11/06/2015   IR RADIOLOGIST EVAL & MGMT 11/06/2015 Jacqulynn Cadet, MD [facility] INTERV RAD  . IR GENERIC HISTORICAL  04/20/2016   IR RADIOLOGIST EVAL & MGMT 04/20/2016 Jacqulynn Cadet, MD [facility] INTERV RAD  . LAPAROSCOPIC GASTRIC BYPASS  2010   [facility]  . LAPAROSCOPIC LYSIS OF ADHESIONS N/A 03/06/2013   Procedure: LAPAROSCOPIC LYSIS OF ADHESIONS AND CLOSURE OF INTERNAL HERNIAS x2;  Surgeon: Adin Hector, MD;  Location: [facility];  Service: General;  Laterality: N/A;  . LAPAROSCOPY N/A 03/06/2013   Procedure: LAPAROSCOPY DIAGNOSTIC;  Surgeon: Adin Hector, MD;  Location: [facility];  Service: General;  Laterality: N/A;  . PERIPHERAL VASCULAR CATHETERIZATION Right 04/30/2014   Procedure: EMBOLIZATION right internal iliac;  Surgeon: Elam Dutch, MD;  Location: [place];  Service: Vascular;  Laterality: Right;    Allergies: Patient has no known allergies.  Medications: Prior to Admission medications   Medication Sig Start Date End Date Taking? Authorizing Provider  amLODipine (NORVASC) 10 MG tablet Take 10 mg by mouth daily.   Yes [provider]  ARIPiprazole (ABILIFY) 2 MG tablet Take 2 mg by mouth daily.   Yes [provider]  aspirin 81 MG tablet Take 81 mg by mouth every morning.    Yes [provider]  buPROPion (WELLBUTRIN XL) 300 MG 24 hr tablet Take 300 mg by mouth daily. 12/12/15  Yes [provider]  carvedilol (COREG) 25 MG tablet Take 1 tablet (25 mg total) by mouth 2 (two) times daily with a meal. 11/27/16  Yes Hongalgi, Lenis Dickinson, MD  lisinopril (PRINIVIL,ZESTRIL) 40 MG tablet Take 1 tablet (40 mg total) every morning by mouth. 03/09/17  Yes Belva Crome, MD  naltrexone (DEPADE) 50 MG tablet Take 50 mg by mouth daily.   Yes [provider]    simvastatin (ZOCOR) 20 MG tablet Take 1 tablet (20 mg total) by mouth every morning. 03/14/14  Yes Belva Crome, MD  vitamin B-12 1000 MCG tablet Take 1 tablet (1,000 mcg total) by mouth daily. Patient not taking: Reported on 05/11/2017 11/28/16   Modena Jansky, MD     Family History  Problem Relation Age of Onset  . Lung cancer Mother   . Cancer Mother        Lung  . AAA (abdominal aortic aneurysm) Father   . Heart disease Father        before age 45  . Heart disease Brother        before age 54    Social History   Socioeconomic History  . Marital status: Married    Spouse name: Not on file  . Number of children: Not on file  . Years of education: Not on file  . Highest education level: Not on file  Social Needs  . Financial resource strain: Not on file  . Food insecurity - worry: Not on file  . Food insecurity - inability: Not on file  . Transportation needs - medical: Not on file  . Transportation needs - non-medical: Not on file  Occupational History  . Not on file  Tobacco Use  . Smoking status: Never Smoker  . Smokeless tobacco: Never Used  Substance and Sexual Activity  . Alcohol use: Yes  . Drug use: No  . Sexual activity: No  Other Topics Concern  . Not on file  Social History Narrative  . Not on file     Review of Systems: A 12 point ROS discussed and pertinent positives are indicated in the HPI above.  All other systems are negative.  Review of Systems  Vital Signs: BP 135/89   Pulse 68   Temp 98.1 F (36.7 C) (Oral)   Resp 14   Ht 5\' 11"  (1.803 m)   Wt 262 lb (118.8 kg)   SpO2 93%   BMI 36.54 kg/m   Physical Exam  Constitutional: He is oriented to person, place, and time. He appears well-developed and well-nourished. No distress.  Neck: Normal range of motion. No JVD present.  Cardiovascular: Normal rate, regular rhythm, normal heart sounds and intact distal pulses.  Pulmonary/Chest: Effort normal and breath sounds normal.   Abdominal: Soft. Bowel sounds are normal. He exhibits no mass. There is no tenderness.  Neurological: He is alert and oriented to person, place, and time.  Skin: Skin is warm and dry.     Imaging: Ct Abdomen Pelvis W Contrast  Result Date: 04/25/2017 CLINICAL DATA:  57 year old male with a history of right-sided abdominal pain for a few years EXAM: CT ABDOMEN AND PELVIS WITH CONTRAST TECHNIQUE: Multidetector CT imaging of the abdomen and pelvis was performed using the standard protocol following bolus administration of intravenous contrast. CONTRAST:  179mL ISOVUE-300 IOPAMIDOL (ISOVUE-300) INJECTION 61% COMPARISON:  Most recent prior CT  scan of the abdomen and pelvis 04/13/2016 ; remote CT scan of the abdomen and pelvis 2020-03-514 FINDINGS: Lower chest: The lung bases are clear. There is an enlarging coronary artery aneurysm in the region of the right coronary artery. This is incompletely imaged and it is unclear if this is an aneurysm of a saphenous vein graft, or a true aneurysm of a coronary artery. Regardless, the aneurysm is well imaged today and measures 4.2 x 3.8 cm. This structure was not imaged well on many prior examinations due to its position at the upper most aspect of the imaged abdomen and pelvis. It appears to be larger than previously seen. Hepatobiliary: Normal hepatic contour and morphology. No discrete hepatic lesions. Normal appearance of the gallbladder. No intra or extrahepatic biliary ductal dilatation. Pancreas: Unremarkable. No pancreatic ductal dilatation or surrounding inflammatory changes. Spleen: Normal in size without focal abnormality. Adrenals/Urinary Tract: The adrenal glands appear normal. No hydronephrosis or enhancing renal mass. There is a solitary punctate stone in the interpolar right kidney. Tiny subcentimeter low-attenuation lesions bilaterally are too small for accurate characterization but statistically highly likely to represent benign cysts. Unremarkable  ureters and bladder. Stomach/Bowel: Surgical changes of prior gastric bypass surgery. No evidence of complication. No focal bowel wall thickening or evidence of obstruction. Normal appendix. Vascular/Lymphatic: Surgical changes of repair of an abdominal aortic aneurysm with a bifurcated endograft extended with the proximal cuff which begins just below the origin of the renal artery's and extends into the left common iliac artery an the right external iliac artery. Patient has also undergone prior repair of a type 2 aneurysm with combined coil and glue embolization of a channel within the aneurysm sac, the IMA and the L3 and L4 lumbar arteries. However, there is new development of contrast material within the aneurysm sac in close proximity to the left L2 lumbar artery. This is concerning for a new type 2 endoleak. 8.0 x 7.7 cm compared to 7.2 x 6.7 cm a year ago. Stable aneurysmal dilatation of the left internal iliac artery with a maximal diameter of 2.4 cm. Perhaps slight interval increase in the more new distal aneurysmal segments in the left superior gluteal artery with the largest measuring up to 1.8 cm compared to 1.6 cm previously. Reproductive: Prostate is unremarkable. Other: No abdominal wall hernia or abnormality. No abdominopelvic ascites. Musculoskeletal: No acute fracture or aggressive appearing lytic or blastic osseous lesion. IMPRESSION: 1. No acute abnormality or abnormal finding to explain the patient's clinical symptoms of chronic right-sided abdominal pain. 2. 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 There is significant interval growth of the aneurysm sac which now measures 8.0 x 7.7 cm compared to 7.2 x 6.7 cm one year ago. Patient is actively followed by interventional radiology and vascular surgery. Interventional Radiology will contact this patient to schedule a follow-up clinic visit. 3. Enlarging right coronary artery, or coronary vein  bypass graft aneurysm. Recommend further evaluation with dedicated CTA of the chest and referral to his cardiothoracic surgeon for further evaluation. 4. Slight interval enlargement of the dominant aneurysm in the left superior gluteal artery which now measures 1.8 cm compared to 1.6 cm. 5. 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 6. Nonobstructing punctate right-sided nephrolithiasis. Signed, Criselda Peaches, MD [facility] Radiology Electronically Signed   By: Jacqulynn Cadet M.D.   On: 04/25/2017 09:51    Labs:  CBC: Recent Labs  11/25/16 1046 11/26/16 0325 11/27/16 0511  WBC 4.1 3.9* 3.6*  HGB 9.6* 8.9* 9.2*  HCT 28.6* 28.0* 28.7*  PLT 137* 128* 124*    COAGS: Recent Labs    11/25/16 1358  INR 1.10    BMP: Recent Labs    11/25/16 1046 11/26/16 0325 11/27/16 0511  NA 136 138 138  K 4.3 4.0 4.0  CL 104 105 107  CO2 23 24 24   GLUCOSE 123* 102* 98  BUN 12 15 17   CALCIUM 8.6* 8.3* 8.2*  CREATININE 1.27* 1.36* 1.35*  GFRNONAA >60 57* 57*  GFRAA >60 >60 >60    LIVER FUNCTION TESTS: No results for input(s): BILITOT, AST, ALT, ALKPHOS, PROT, ALBUMIN in the last 8760 hours.  TUMOR MARKERS: No results for input(s): AFPTM, CEA, CA199, CHROMGRNA in the last 8760 hours.  Assessment and Plan: Abdominal aortic endoleak with enlarging sac. We reviewed his CTA extensively today showing enlarging aneurysmal dilatation in multiple locations including his abdominal aorta secondary to the identified leak, as well as the left gluteal artery and of his right coronary vein bypass(though this was not fully imaged on his abdominal CT) We recommend getting a full CT of the chest to further eval the coronary bypass aneurysm. We will get [place] LE arterial imaging to assess for popliteal aneurysms. Need to consider MRA of the brain to eval for cerebral aneurysms. We also discussed potentially fixing the newly identified  endoleak via direct stick access once these additional diagnostic tests are completed.  Thank you for this interesting consult.  I greatly enjoyed meeting Daniel Blevins and look forward to participating in their care.  A copy of this report was sent to the requesting provider on this date.  Electronically Signed: Ascencion Dike 05/11/2017, 9:13 AM   I spent a total of 30 minutes in face to face in clinical consultation, greater than 50% of which was counseling/coordinating care for abdominal aortic aneurysm endoleak

## 2017-05-12 ENCOUNTER — Ambulatory Visit (HOSPITAL_COMMUNITY)
Admission: RE | Admit: 2017-05-12 | Discharge: 2017-05-12 | Disposition: A | Discharge status: Home / Self Care | Payer: 59 | Source: Ambulatory Visit | Attending: Interventional Radiology | Admitting: Interventional Radiology

## 2017-05-12 ENCOUNTER — Encounter (HOSPITAL_COMMUNITY): Payer: Self-pay

## 2017-05-12 DIAGNOSIS — I2510 Atherosclerotic heart disease of native coronary artery without angina pectoris: Secondary | ICD-10-CM | POA: Diagnosis not present

## 2017-05-12 DIAGNOSIS — I729 Aneurysm of unspecified site: Secondary | ICD-10-CM

## 2017-05-12 DIAGNOSIS — Z951 Presence of aortocoronary bypass graft: Secondary | ICD-10-CM | POA: Diagnosis not present

## 2017-05-12 DIAGNOSIS — I517 Cardiomegaly: Secondary | ICD-10-CM | POA: Diagnosis not present

## 2017-05-12 DIAGNOSIS — I2541 Coronary artery aneurysm: Secondary | ICD-10-CM | POA: Diagnosis not present

## 2017-05-12 MED ORDER — IOPAMIDOL (ISOVUE-370) INJECTION 76%
INTRAVENOUS | Status: AC
  Administered 2017-05-12: 100 mL via INTRAVENOUS
  Filled 2017-05-12: qty 100

## 2017-05-12 MED ORDER — IOPAMIDOL (ISOVUE-370) INJECTION 76%
100.0000 mL | Freq: Once | INTRAVENOUS | Status: AC | PRN
  Administered 2017-05-12: 100 mL via INTRAVENOUS

## 2017-05-13 ENCOUNTER — Telehealth (HOSPITAL_COMMUNITY): Payer: Self-pay | Admitting: Radiology

## 2017-05-13 NOTE — Telephone Encounter (Signed)
Called pt, left VM that I will be setting up his endoleak repair with Dr. Laurence Ferrari on 05/20/17. Asked him to return the call for details, questions, and further instructions. JM

## 2017-05-16 ENCOUNTER — Other Ambulatory Visit: Payer: 59

## 2017-05-18 ENCOUNTER — Ambulatory Visit
Admission: RE | Admit: 2017-05-18 | Discharge: 2017-05-18 | Disposition: A | Discharge status: Home / Self Care | Payer: 59 | Source: Ambulatory Visit | Attending: Interventional Radiology | Admitting: Interventional Radiology

## 2017-05-18 ENCOUNTER — Other Ambulatory Visit: Payer: Self-pay | Admitting: Radiology

## 2017-05-18 DIAGNOSIS — I724 Aneurysm of artery of lower extremity: Secondary | ICD-10-CM

## 2017-05-19 ENCOUNTER — Other Ambulatory Visit: Payer: Self-pay | Admitting: General Surgery

## 2017-05-19 ENCOUNTER — Encounter (HOSPITAL_COMMUNITY): Payer: Self-pay | Admitting: *Deleted

## 2017-05-19 ENCOUNTER — Ambulatory Visit (HOSPITAL_COMMUNITY)
Admission: RE | Admit: 2017-05-19 | Discharge: 2017-05-19 | Disposition: A | Discharge status: Home / Self Care | Payer: 59 | Source: Ambulatory Visit | Attending: Interventional Radiology | Admitting: Interventional Radiology

## 2017-05-19 ENCOUNTER — Other Ambulatory Visit: Payer: Self-pay

## 2017-05-19 ENCOUNTER — Other Ambulatory Visit: Payer: Self-pay | Admitting: Radiology

## 2017-05-19 DIAGNOSIS — I671 Cerebral aneurysm, nonruptured: Secondary | ICD-10-CM | POA: Insufficient documentation

## 2017-05-19 NOTE — Anesthesia Preprocedure Evaluation (Addendum)
Anesthesia Evaluation  Patient identified by MRN, date of birth, ID band Patient awake    Reviewed: Allergy & Precautions, NPO status , Patient's Chart, lab work & pertinent test results, reviewed documented beta blocker date and time   History of Anesthesia Complications Negative for: history of anesthetic complications  Airway Mallampati: II  TM Distance: >3 FB Neck ROM: Full    Dental  (+) Edentulous Upper, Edentulous Lower   Pulmonary sleep apnea (no longer requires CPAP) ,    breath sounds clear to auscultation       Cardiovascular hypertension, Pt. on medications and Pt. on home beta blockers (-) angina+ CAD, + CABG and + Peripheral Vascular Disease (s/p AAA stent graft)   Rhythm:Regular Rate:Normal  7/18 ECHO: EF 60-65%, valves OK   Neuro/Psych negative neurological ROS     GI/Hepatic Neg liver ROS, neg GERD  ,S/p gastric bypass   Endo/Other  Morbid obesity  Renal/GU negative Renal ROS     Musculoskeletal   Abdominal (+) + obese,   Peds  Hematology negative hematology ROS (+)   Anesthesia Other Findings   Reproductive/Obstetrics                            Anesthesia Physical Anesthesia Plan  ASA: III  Anesthesia Plan: General   Post-op Pain Management:    Induction: Intravenous  PONV Risk Score and Plan: 1 and Ondansetron and Dexamethasone  Airway Management Planned: Oral ETT  Additional Equipment: Arterial line  Intra-op Plan:   Post-operative Plan: Extubation in OR  Informed Consent: I have reviewed the patients History and Physical, chart, labs and discussed the procedure including the risks, benefits and alternatives for the proposed anesthesia with the patient or authorized representative who has indicated his/her understanding and acceptance.   Dental advisory given  Plan Discussed with: CRNA and Surgeon  Anesthesia Plan Comments: (Plan routine monitors,  A-line, GETA)        Anesthesia Quick Evaluation

## 2017-05-19 NOTE — Progress Notes (Signed)
Spoke with pt for pre-op call. Pt has hx of CAD with CABG. Pt's cardiologist is Dr. Linard Millers. His last office visit was 12/03/16 and was told to follow up in a year. Pt denies any recent chest pain or sob.   Echo -11/26/16 Cath - 2001

## 2017-05-20 ENCOUNTER — Ambulatory Visit (HOSPITAL_COMMUNITY): Payer: 59 | Admitting: Anesthesiology

## 2017-05-20 ENCOUNTER — Encounter (HOSPITAL_COMMUNITY): Payer: Self-pay

## 2017-05-20 ENCOUNTER — Encounter (HOSPITAL_COMMUNITY): Payer: Self-pay | Admitting: *Deleted

## 2017-05-20 ENCOUNTER — Other Ambulatory Visit: Payer: Self-pay

## 2017-05-20 ENCOUNTER — Observation Stay (HOSPITAL_COMMUNITY)
Admission: RE | Admit: 2017-05-20 | Discharge: 2017-05-20 | Disposition: A | Discharge status: Home / Self Care | Payer: 59 | Source: Ambulatory Visit | Attending: Interventional Radiology | Admitting: Interventional Radiology

## 2017-05-20 ENCOUNTER — Ambulatory Visit (HOSPITAL_COMMUNITY)
Admission: RE | Admit: 2017-05-20 | Discharge: 2017-05-20 | Disposition: A | Discharge status: Home / Self Care | Payer: 59 | Source: Ambulatory Visit | Attending: Interventional Radiology | Admitting: Interventional Radiology

## 2017-05-20 ENCOUNTER — Encounter (HOSPITAL_COMMUNITY)
Admission: RE | Disposition: A | Discharge status: Home / Self Care | Payer: Self-pay | Source: Ambulatory Visit | Attending: Interventional Radiology

## 2017-05-20 DIAGNOSIS — Z9884 Bariatric surgery status: Secondary | ICD-10-CM | POA: Insufficient documentation

## 2017-05-20 DIAGNOSIS — Z7982 Long term (current) use of aspirin: Secondary | ICD-10-CM | POA: Insufficient documentation

## 2017-05-20 DIAGNOSIS — I724 Aneurysm of artery of lower extremity: Secondary | ICD-10-CM | POA: Diagnosis not present

## 2017-05-20 DIAGNOSIS — I739 Peripheral vascular disease, unspecified: Secondary | ICD-10-CM | POA: Insufficient documentation

## 2017-05-20 DIAGNOSIS — T82330D Leakage of aortic (bifurcation) graft (replacement), subsequent encounter: Secondary | ICD-10-CM

## 2017-05-20 DIAGNOSIS — T82330A Leakage of aortic (bifurcation) graft (replacement), initial encounter: Secondary | ICD-10-CM | POA: Diagnosis present

## 2017-05-20 DIAGNOSIS — Z79899 Other long term (current) drug therapy: Secondary | ICD-10-CM | POA: Diagnosis not present

## 2017-05-20 DIAGNOSIS — I10 Essential (primary) hypertension: Secondary | ICD-10-CM | POA: Insufficient documentation

## 2017-05-20 DIAGNOSIS — IMO0001 Reserved for inherently not codable concepts without codable children: Secondary | ICD-10-CM

## 2017-05-20 DIAGNOSIS — Y832 Surgical operation with anastomosis, bypass or graft as the cause of abnormal reaction of the patient, or of later complication, without mention of misadventure at the time of the procedure: Secondary | ICD-10-CM | POA: Insufficient documentation

## 2017-05-20 DIAGNOSIS — Z951 Presence of aortocoronary bypass graft: Secondary | ICD-10-CM

## 2017-05-20 DIAGNOSIS — I2584 Coronary atherosclerosis due to calcified coronary lesion: Secondary | ICD-10-CM | POA: Insufficient documentation

## 2017-05-20 DIAGNOSIS — I2510 Atherosclerotic heart disease of native coronary artery without angina pectoris: Secondary | ICD-10-CM | POA: Diagnosis not present

## 2017-05-20 DIAGNOSIS — H9191 Unspecified hearing loss, right ear: Secondary | ICD-10-CM | POA: Insufficient documentation

## 2017-05-20 DIAGNOSIS — Z6836 Body mass index (BMI) 36.0-36.9, adult: Secondary | ICD-10-CM | POA: Insufficient documentation

## 2017-05-20 DIAGNOSIS — T82330S Leakage of aortic (bifurcation) graft (replacement), sequela: Secondary | ICD-10-CM

## 2017-05-20 HISTORY — PX: IR EMBO ARTERIAL NOT HEMORR HEMANG INC GUIDE ROADMAPPING: IMG5448

## 2017-05-20 HISTORY — PX: RADIOLOGY WITH ANESTHESIA: SHX6223

## 2017-05-20 HISTORY — PX: IR AORTAGRAM ABDOMINAL SERIALOGRAM: IMG636

## 2017-05-20 HISTORY — PX: IR CT SPINE LTD: IMG2387

## 2017-05-20 LAB — CBC
HEMATOCRIT: 34 % — AB (ref 39.0–52.0)
Hemoglobin: 11.2 g/dL — ABNORMAL LOW (ref 13.0–17.0)
MCH: 26.6 pg (ref 26.0–34.0)
MCHC: 32.9 g/dL (ref 30.0–36.0)
MCV: 80.8 fL (ref 78.0–100.0)
Platelets: 145 10*3/uL — ABNORMAL LOW (ref 150–400)
RBC: 4.21 MIL/uL — ABNORMAL LOW (ref 4.22–5.81)
RDW: 14.9 % (ref 11.5–15.5)
WBC: 4 10*3/uL (ref 4.0–10.5)

## 2017-05-20 LAB — TYPE AND SCREEN
ABO/RH(D): O POS
ANTIBODY SCREEN: NEGATIVE

## 2017-05-20 LAB — PROTIME-INR
INR: 1.1
Prothrombin Time: 14.2 seconds (ref 11.4–15.2)

## 2017-05-20 LAB — BASIC METABOLIC PANEL
ANION GAP: 10 (ref 5–15)
BUN: 17 mg/dL (ref 6–20)
CHLORIDE: 105 mmol/L (ref 101–111)
CO2: 22 mmol/L (ref 22–32)
Calcium: 8.4 mg/dL — ABNORMAL LOW (ref 8.9–10.3)
Creatinine, Ser: 1.22 mg/dL (ref 0.61–1.24)
GFR calc Af Amer: >60 mL/min (ref >60)
Glucose, Bld: 93 mg/dL (ref 65–99)
POTASSIUM: 3.8 mmol/L (ref 3.5–5.1)
SODIUM: 137 mmol/L (ref 135–145)

## 2017-05-20 LAB — APTT: APTT: 28 s (ref 24–36)

## 2017-05-20 SURGERY — IR WITH ANESTHESIA
Anesthesia: General

## 2017-05-20 MED ORDER — AMLODIPINE BESYLATE 10 MG PO TABS: 10.0000 mg | ORAL_TABLET | Freq: Every day | ORAL | Status: DC

## 2017-05-20 MED ORDER — DEXAMETHASONE SODIUM PHOSPHATE 10 MG/ML IJ SOLN
INTRAMUSCULAR | Status: DC | PRN
  Administered 2017-05-20: 10 mg via INTRAVENOUS

## 2017-05-20 MED ORDER — SODIUM CHLORIDE 0.9 % IV SOLN: INTRAVENOUS | Status: DC

## 2017-05-20 MED ORDER — CARVEDILOL 25 MG PO TABS
25.0000 mg | ORAL_TABLET | Freq: Two times a day (BID) | ORAL | Status: DC
  Administered 2017-05-20: 25 mg via ORAL
  Filled 2017-05-20: qty 1

## 2017-05-20 MED ORDER — DOCUSATE SODIUM 100 MG PO CAPS: 100.0000 mg | ORAL_CAPSULE | Freq: Two times a day (BID) | ORAL | Status: DC

## 2017-05-20 MED ORDER — ONDANSETRON HCL 4 MG/2ML IJ SOLN: 4.0000 mg | Freq: Four times a day (QID) | INTRAMUSCULAR | Status: DC | PRN

## 2017-05-20 MED ORDER — GELATIN ABSORBABLE 12-7 MM EX MISC
CUTANEOUS | Status: AC
  Administered 2017-05-20: 10:00:00
  Filled 2017-05-20: qty 1

## 2017-05-20 MED ORDER — LACTATED RINGERS IV SOLN
INTRAVENOUS | Status: DC | PRN
  Administered 2017-05-20 (×2): via INTRAVENOUS

## 2017-05-20 MED ORDER — HYDROMORPHONE HCL 2 MG PO TABS: 1.0000 mg | ORAL_TABLET | ORAL | Status: DC | PRN

## 2017-05-20 MED ORDER — PHENYLEPHRINE HCL 10 MG/ML IJ SOLN
INTRAMUSCULAR | Status: DC | PRN
  Administered 2017-05-20 (×2): 80 ug via INTRAVENOUS

## 2017-05-20 MED ORDER — LIDOCAINE HCL 1 % IJ SOLN
INTRAMUSCULAR | Status: DC | PRN
  Administered 2017-05-20: 25 mg via INTRADERMAL

## 2017-05-20 MED ORDER — ARIPIPRAZOLE 2 MG PO TABS: 2.0000 mg | ORAL_TABLET | Freq: Every day | ORAL | Status: DC

## 2017-05-20 MED ORDER — SUGAMMADEX SODIUM 500 MG/5ML IV SOLN
INTRAVENOUS | Status: DC | PRN
  Administered 2017-05-20: 240 mg via INTRAVENOUS

## 2017-05-20 MED ORDER — FENTANYL CITRATE (PF) 100 MCG/2ML IJ SOLN
INTRAMUSCULAR | Status: DC | PRN
  Administered 2017-05-20: 50 ug via INTRAVENOUS
  Administered 2017-05-20: 100 ug via INTRAVENOUS

## 2017-05-20 MED ORDER — ROCURONIUM BROMIDE 100 MG/10ML IV SOLN
INTRAVENOUS | Status: DC | PRN
  Administered 2017-05-20: 50 mg via INTRAVENOUS
  Administered 2017-05-20: 20 mg via INTRAVENOUS

## 2017-05-20 MED ORDER — SENNOSIDES-DOCUSATE SODIUM 8.6-50 MG PO TABS: 1.0000 | ORAL_TABLET | Freq: Every evening | ORAL | Status: DC | PRN

## 2017-05-20 MED ORDER — BUPROPION HCL ER (XL) 150 MG PO TB24: 300.0000 mg | ORAL_TABLET | Freq: Every day | ORAL | Status: DC

## 2017-05-20 MED ORDER — SIMVASTATIN 20 MG PO TABS: 20.0000 mg | ORAL_TABLET | Freq: Every day | ORAL | Status: DC

## 2017-05-20 MED ORDER — ASPIRIN 81 MG PO CHEW: 81.0000 mg | CHEWABLE_TABLET | Freq: Every day | ORAL | Status: DC

## 2017-05-20 MED ORDER — EPHEDRINE SULFATE 50 MG/ML IJ SOLN
INTRAMUSCULAR | Status: DC | PRN
  Administered 2017-05-20 (×2): 10 mg via INTRAVENOUS

## 2017-05-20 MED ORDER — NALTREXONE HCL 50 MG PO TABS: 50.0000 mg | ORAL_TABLET | Freq: Every day | ORAL | Status: DC

## 2017-05-20 MED ORDER — IOPAMIDOL (ISOVUE-300) INJECTION 61%
INTRAVENOUS | Status: AC
  Filled 2017-05-20: qty 150

## 2017-05-20 MED ORDER — MEPERIDINE HCL 25 MG/ML IJ SOLN: 6.2500 mg | INTRAMUSCULAR | Status: DC | PRN

## 2017-05-20 MED ORDER — PHENYLEPHRINE HCL 10 MG/ML IJ SOLN
INTRAVENOUS | Status: DC | PRN
  Administered 2017-05-20: 50 ug/min via INTRAVENOUS

## 2017-05-20 MED ORDER — MIDAZOLAM HCL 2 MG/2ML IJ SOLN
INTRAMUSCULAR | Status: DC | PRN
  Administered 2017-05-20 (×2): 1 mg via INTRAVENOUS

## 2017-05-20 MED ORDER — PROMETHAZINE HCL 25 MG/ML IJ SOLN: 6.2500 mg | INTRAMUSCULAR | Status: DC | PRN

## 2017-05-20 MED ORDER — IOPAMIDOL (ISOVUE-300) INJECTION 61%
INTRAVENOUS | Status: AC
  Administered 2017-05-20: 30 mL
  Filled 2017-05-20: qty 150

## 2017-05-20 MED ORDER — CEFAZOLIN SODIUM-DEXTROSE 2-4 GM/100ML-% IV SOLN
2.0000 g | INTRAVENOUS | Status: AC
  Administered 2017-05-20: 2 g via INTRAVENOUS
  Filled 2017-05-20: qty 100

## 2017-05-20 MED ORDER — LISINOPRIL 40 MG PO TABS: 40.0000 mg | ORAL_TABLET | Freq: Every morning | ORAL | Status: DC

## 2017-05-20 MED ORDER — HYDROCODONE-ACETAMINOPHEN 5-325 MG PO TABS: 1.0000 | ORAL_TABLET | ORAL | Status: DC | PRN

## 2017-05-20 MED ORDER — MIDAZOLAM HCL 2 MG/2ML IJ SOLN: 0.5000 mg | Freq: Once | INTRAMUSCULAR | Status: DC | PRN

## 2017-05-20 MED ORDER — ONDANSETRON HCL 4 MG/2ML IJ SOLN
INTRAMUSCULAR | Status: DC | PRN
  Administered 2017-05-20: 4 mg via INTRAVENOUS

## 2017-05-20 MED ORDER — PROPOFOL 10 MG/ML IV BOLUS
INTRAVENOUS | Status: DC | PRN
  Administered 2017-05-20: 50 mg via INTRAVENOUS
  Administered 2017-05-20: 150 mg via INTRAVENOUS

## 2017-05-20 NOTE — Procedures (Signed)
Interventional Radiology Procedure Note  Procedure: Direct stick aortic endoleak repair.   Complications: None  Estimated Blood Loss: None  Recommendations: - Bedrest x 6 hrs - Observe overnight  Signed,  Criselda Peaches, MD

## 2017-05-20 NOTE — Transfer of Care (Signed)
Immediate Anesthesia Transfer of Care Note  Patient: Daniel Blevins  Procedure(s) Performed: Endoleak Repair (N/A )  Patient Location: PACU  Anesthesia Type:General  Level of Consciousness: awake and patient cooperative  Airway & Oxygen Therapy: Patient Spontanous Breathing  Post-op Assessment: Report given to RN and Post -op Vital signs reviewed and stable  Post vital signs: Reviewed and stable  Last Vitals:  Vitals:   05/20/17 0605  BP: (!) 139/93  Pulse: (!) 59  Resp: 16  Temp: 37 C  SpO2: 96%    Last Pain:  Vitals:   05/20/17 0605  TempSrc: Oral      Patients Stated Pain Goal: 3 (72/18/28 8337)  Complications: No apparent anesthesia complications

## 2017-05-20 NOTE — Anesthesia Procedure Notes (Signed)
Procedure Name: Intubation Date/Time: 05/20/2017 8:30 AM Performed by: Lance Coon, CRNA Pre-anesthesia Checklist: Patient identified, Emergency Drugs available, Suction available, Patient being monitored and Timeout performed Patient Re-evaluated:Patient Re-evaluated prior to induction Oxygen Delivery Method: Circle system utilized Preoxygenation: Pre-oxygenation with 100% oxygen Induction Type: IV induction Ventilation: Mask ventilation without difficulty Laryngoscope Size: Miller and 3 Grade View: Grade I Tube type: Oral Tube size: 7.5 mm Number of attempts: 1 Airway Equipment and Method: Stylet Placement Confirmation: ETT inserted through vocal cords under direct vision,  positive ETCO2 and breath sounds checked- equal and bilateral Secured at: 21 cm Tube secured with: Tape Dental Injury: Teeth and Oropharynx as per pre-operative assessment

## 2017-05-20 NOTE — Progress Notes (Signed)
Patient assessed this afternoon s/p endoleak repair. Patient states he is feeling well and is without complaints.  He has eaten and tolerated his diet well.  He has been able to void. He asks if he can go home tonight.   Discussed with Dr. Laurence Ferrari.  Patient stable, VSS, puncture site intact and non-tender. Patient doing well overall.  Stable for discharge home.   Orders and discharge instructions placed in chart.   RN aware patient may discharge home this evening if ambulating well.   Brynda Greathouse, MS RD PA-C 5:21 PM

## 2017-05-20 NOTE — H&P (Signed)
Chief Complaint: Patient was seen in consultation today for new AAA endoleak embolization at the request of Dr Pershing Proud  Referring Physician(s): Dr Pershing Proud    Supervising Physician: Jacqulynn Cadet  Patient Status: [facility] - Out-pt  History of Present Illness: Daniel Blevins is a 57 y.o. male   History AAA surgery 04/2014 and 05/2015 New endoleak per CTA 05/12/17: IMPRESSION: 1. Enlarging right native coronary artery aneurysm now measuring up to 4.6 cm in greatest diameter compared to 2.8 cm in November of 2014. 2. Coronary artery calcifications. 3. Patent 2 vessel CABG. 4. Mild cardiomegaly.  1/9 note-- Dr Laurence Ferrari Unfortunately, Mr. 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  I think he must have some underlying predisposition to aneurysm formation.  I'd like to further evaluate this saphenous vein graft aneurysm and send him to a cardiothoracic surgeon for evaluation. Additionally, I like to screen him for other sites of common aneurysm formation including the intracranial vasculature and the popliteal arteries. Finally, I think we probably need to proceed with an attempted repeat repair of his new type II endoleak given the interval enlargement of his aneurysm sac. This will require a percutaneous direct sac puncture under general anesthesia.  We will hold off on treatment of his left superior gluteal artery aneurysms for now. I will continue to monitor those on follow-up imaging. At some point in the future he may require embolization if they continue to enlarge but this could result in severe buttock claudication.  1.) CTA chest to evaluate his saphenous vein graft aneurysm  2.) Referral to Dr. Ceasar Mons following completion of the CTA chest 3.) Bilateral lower extremity duplex arterial US to  screen for popliteal artery aneurysms 4.) Brain MRA to screen for intra-cranial aneuryms 5.) Schedule for repeat endoleak repair at [facility].  Will be a direct sac puncture approach.   MRA Head yesterday: IMPRESSION: Negative  B LE arterial doppler yesterday: IMPRESSION: 1. Positive for bilateral popliteal artery aneurysms measuring up to 2.5 cm on the right and 2.6 cm on the left. Mild, smooth peripheral wall adherent thrombus versus fibrofatty plaque. 2. Scattered heterogeneous atherosclerotic plaque throughout the other visualized arteries without evidence of significant stenosis or occlusion. 3. Normal bilateral resting ankle-brachial indices  Scheduled now for endoleak embolization in IR with Dr Laurence Ferrari   Past Medical History:  Diagnosis Date  . AKI (acute kidney injury) ([place])   . Chest pain   . Coronary artery disease   . Deafness in right ear   . Depression   . Dysrhythmia   . 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 History of kidney stones   . Hx of CABG   . Hypertension   . Sleep apnea    no longer is an issue  . Urinary frequency     Past Surgical History:  Procedure Laterality Date  . ABDOMINAL AORTIC ENDOVASCULAR STENT GRAFT N/A 04/30/2014   Procedure: ABDOMINAL AORTIC ENDOVASCULAR STENT GRAFT;  Surgeon: Elam Dutch, MD;  Location: [facility] OR;  Service: Vascular;  Laterality: N/A;  . ABDOMINAL AORTIC ENDOVASCULAR STENT GRAFT N/A 05/26/2015   Procedure: ABDOMINAL AORTIC ENDOVASCULAR STENT GRAFT RE-INTERVENTION; REPAIR OF PROXIMAL CUFF;  Surgeon: Elam Dutch, MD;  Location: [place];  Service: Vascular;  Laterality: N/A;  . BACK SURGERY  2009   lower back  . CARDIAC CATHETERIZATION    .  COLONOSCOPY    . CORONARY ARTERY BYPASS GRAFT  2001  . CYSTOSCOPY WITH RETROGRADE PYELOGRAM, URETEROSCOPY AND STENT PLACEMENT Bilateral 02/07/2014   Procedure: CYSTOSCOPY WITH BILATERAL RETROGRADE PYELOGRAM, BILATERAL URETEROSCOPY, RIGHT EXTRACTION OF STONES AND RIGHT STENT  PLACEMENT;  Surgeon: Jorja Loa, MD;  Location: [facility]S;  Service: Urology;  Laterality: Bilateral;  . ESOPHAGOGASTRODUODENOSCOPY    . IR GENERIC HISTORICAL  11/06/2015   IR RADIOLOGIST EVAL & MGMT 11/06/2015 Jacqulynn Cadet, MD [facility] INTERV RAD  . IR GENERIC HISTORICAL  04/20/2016   IR RADIOLOGIST EVAL & MGMT 04/20/2016 Jacqulynn Cadet, MD [facility] INTERV RAD  . IR RADIOLOGIST EVAL & MGMT  05/11/2017  . LAPAROSCOPIC GASTRIC BYPASS  2010   [facility] [address]  . LAPAROSCOPIC LYSIS OF ADHESIONS N/A 03/06/2013   Procedure: LAPAROSCOPIC LYSIS OF ADHESIONS AND CLOSURE OF INTERNAL HERNIAS x2;  Surgeon: Adin Hector, MD;  Location: [facility];  Service: General;  Laterality: N/A;  . LAPAROSCOPY N/A 03/06/2013   Procedure: LAPAROSCOPY DIAGNOSTIC;  Surgeon: Adin Hector, MD;  Location: [facility];  Service: General;  Laterality: N/A;  . PERIPHERAL VASCULAR CATHETERIZATION Right 04/30/2014   Procedure: EMBOLIZATION right internal iliac;  Surgeon: Elam Dutch, MD;  Location: [place];  Service: Vascular;  Laterality: Right;    Allergies: Patient has no known allergies.  Medications: Prior to Admission medications   Medication Sig Start Date End Date Taking? Authorizing Provider  amLODipine (NORVASC) 10 MG tablet Take 10 mg by mouth daily.    [provider]  ARIPiprazole (ABILIFY) 2 MG tablet Take 2 mg by mouth daily.    [provider]  aspirin 81 MG tablet Take 81 mg by mouth every morning.     [provider]  buPROPion (WELLBUTRIN XL) 300 MG 24 hr tablet Take 300 mg by mouth daily. 12/12/15   [provider]  carvedilol (COREG) 25 MG tablet Take 1 tablet (25 mg total) by mouth 2 (two) times daily with a meal. 11/27/16   Hongalgi, Lenis Dickinson, MD  lisinopril (PRINIVIL,ZESTRIL) 40 MG tablet Take 1 tablet (40 mg total) every morning by mouth. 03/09/17   Belva Crome, MD  naltrexone (DEPADE) 50 MG tablet Take 50 mg by mouth daily.    [provider]  simvastatin  (ZOCOR) 20 MG tablet Take 1 tablet (20 mg total) by mouth every morning. 03/14/14   Belva Crome, MD  vitamin B-12 1000 MCG tablet Take 1 tablet (1,000 mcg total) by mouth daily. Patient not taking: Reported on 05/11/2017 11/28/16   Modena Jansky, MD     Family History  Problem Relation Age of Onset  . Lung cancer Mother   . Cancer Mother        Lung  . AAA (abdominal aortic aneurysm) Father   . Heart disease Father        before age 66  . Heart disease Brother        before age 43    Social History   Socioeconomic History  . Marital status: Married    Spouse name: None  . Number of children: None  . Years of education: None  . Highest education level: None  Social Needs  . Financial resource strain: None  . Food insecurity - worry: None  . Food insecurity - inability: None  . Transportation needs - medical: None  . Transportation needs - non-medical: None  Occupational History  . None  Tobacco Use  . Smoking status: Never Smoker  .  Smokeless tobacco: Never Used  Substance and Sexual Activity  . Alcohol use: Yes    Comment: occasional  . Drug use: No  . Sexual activity: No  Other Topics Concern  . None  Social History Narrative  . None     Review of Systems: A 12 point ROS discussed and pertinent positives are indicated in the HPI above.  All other systems are negative.  Review of Systems  Constitutional: Negative for activity change and fever.  Eyes: Negative for visual disturbance.  Respiratory: Negative for cough and shortness of breath.   Cardiovascular: Negative for chest pain.  Gastrointestinal: Negative for abdominal pain.  Musculoskeletal: Negative for back pain and gait problem.  Neurological: Negative for weakness.  Psychiatric/Behavioral: Negative for behavioral problems and confusion.    Vital Signs: There were no vitals taken for this visit.  Physical Exam  Constitutional: He is oriented to person, place, and time. He appears  well-nourished.  HENT:  Head: Atraumatic.  Eyes: EOM are normal.  Neck: Neck supple.  Cardiovascular: Normal rate, regular rhythm and normal heart sounds.  Pulmonary/Chest: Effort normal and breath sounds normal. He has no wheezes.  Abdominal: Soft. Bowel sounds are normal. There is no tenderness.  Musculoskeletal: Normal range of motion.  Neurological: He is alert and oriented to person, place, and time.  Skin: Skin is warm and dry.  Psychiatric: He has a normal mood and affect. His behavior is normal. Judgment and thought content normal.  Nursing note and vitals reviewed.   Imaging: Mr Virgel Paling [facility] Contrast  Result Date: 05/19/2017 CLINICAL DATA:  Screening for intracranial aneurysm EXAM: MRA HEAD WITHOUT CONTRAST TECHNIQUE: Angiographic images of the Circle of Willis were obtained using MRA technique without intravenous contrast. COMPARISON:  None. FINDINGS: Both vertebral arteries normal. Basilar normal. Right PICA patent. Left PICA not visualized. Superior cerebellar and posterior cerebral arteries patent bilaterally. Fetal origin of the posterior cerebral artery bilaterally. Cavernous carotid is patent bilaterally without significant stenosis. Anterior and middle cerebral arteries are patent bilaterally. Negative for cerebral aneurysm. IMPRESSION: Negative Electronically Signed   By: Franchot Gallo M.D.   On: 05/19/2017 10:22   Ct Abdomen Pelvis W Contrast  Result Date: 04/25/2017 CLINICAL DATA:  57 year old male with a history of right-sided abdominal pain for a few years EXAM: CT ABDOMEN AND PELVIS WITH CONTRAST TECHNIQUE: Multidetector CT imaging of the abdomen and pelvis was performed using the standard protocol following bolus administration of intravenous contrast. CONTRAST:  163mL ISOVUE-300 IOPAMIDOL (ISOVUE-300) INJECTION 61% COMPARISON:  Most recent prior CT scan of the abdomen and pelvis 04/13/2016 ; remote CT scan of the abdomen and pelvis 2020-07-3013 FINDINGS: Lower chest: The  lung bases are clear. There is an enlarging coronary artery aneurysm in the region of the right coronary artery. This is incompletely imaged and it is unclear if this is an aneurysm of a saphenous vein graft, or a true aneurysm of a coronary artery. Regardless, the aneurysm is well imaged today and measures 4.2 x 3.8 cm. This structure was not imaged well on many prior examinations due to its position at the upper most aspect of the imaged abdomen and pelvis. It appears to be larger than previously seen. Hepatobiliary: Normal hepatic contour and morphology. No discrete hepatic lesions. Normal appearance of the gallbladder. No intra or extrahepatic biliary ductal dilatation. Pancreas: Unremarkable. No pancreatic ductal dilatation or surrounding inflammatory changes. Spleen: Normal in size without focal abnormality. Adrenals/Urinary Tract: The adrenal glands appear normal. No hydronephrosis or enhancing renal  mass. There is a solitary punctate stone in the interpolar right kidney. Tiny subcentimeter low-attenuation lesions bilaterally are too small for accurate characterization but statistically highly likely to represent benign cysts. Unremarkable ureters and bladder. Stomach/Bowel: Surgical changes of prior gastric bypass surgery. No evidence of complication. No focal bowel wall thickening or evidence of obstruction. Normal appendix. Vascular/Lymphatic: Surgical changes of repair of an abdominal aortic aneurysm with a bifurcated endograft extended with the proximal cuff which begins just below the origin of the renal artery's and extends into the left common iliac artery an the right external iliac artery. Patient has also undergone prior repair of a type 2 aneurysm with combined coil and glue embolization of a channel within the aneurysm sac, the IMA and the L3 and L4 lumbar arteries. However, there is new development of contrast material within the aneurysm sac in close proximity to the left L2 lumbar artery. This  is concerning for a new type 2 endoleak. 8.0 x 7.7 cm compared to 7.2 x 6.7 cm a year ago. Stable aneurysmal dilatation of the left internal iliac artery with a maximal diameter of 2.4 cm. Perhaps slight interval increase in the more new distal aneurysmal segments in the left superior gluteal artery with the largest measuring up to 1.8 cm compared to 1.6 cm previously. Reproductive: Prostate is unremarkable. Other: No abdominal wall hernia or abnormality. No abdominopelvic ascites. Musculoskeletal: No acute fracture or aggressive appearing lytic or blastic osseous lesion. IMPRESSION: 1. No acute abnormality or abnormal finding to explain the patient's clinical symptoms of chronic right-sided abdominal pain. 2. 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 There is significant interval growth of the aneurysm sac which now measures 8.0 x 7.7 cm compared to 7.2 x 6.7 cm one year ago. Patient is actively followed by interventional radiology and vascular surgery. Interventional Radiology will contact this patient to schedule a follow-up clinic visit. 3. Enlarging right coronary artery, or coronary vein bypass graft aneurysm. Recommend further evaluation with dedicated CTA of the chest and referral to his cardiothoracic surgeon for further evaluation. 4. Slight interval enlargement of the dominant aneurysm in the left superior gluteal artery which now measures 1.8 cm compared to 1.6 cm. 5. 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 6. Nonobstructing punctate right-sided nephrolithiasis. Signed, Criselda Peaches, MD [facility] Radiology Electronically Signed   By: Jacqulynn Cadet M.D.   On: 04/25/2017 09:51   US Arterial Lower Extremity Duplex Bilateral  Result Date: 05/19/2017 CLINICAL DATA:  57 year old male with a history of coronary artery, abdominal aortic and left superior gluteal aneurysms. Evaluate for  underlying popliteal artery aneurysms. EXAM: BILATERAL LOWER EXTREMITY ARTERIAL DUPLEX SCAN TECHNIQUE: Gray-scale sonography as well as color Doppler and duplex ultrasound was performed to evaluate the arteries of both lower extremities including the common, superficial and profunda femoral arteries, popliteal artery and calf arteries. COMPARISON:  None. FINDINGS: Right Lower Extremity ABI: 1.13 Inflow: Normal common femoral arterial waveforms and velocities. Mild scattered atherosclerotic plaque. No evidence of inflow (aortoiliac) disease. Outflow: Normal profunda femoral, superficial femoral and popliteal arterial waveforms and velocities. Mild scattered heterogeneous atherosclerotic plaque. No focal elevation of the PSV to suggest stenosis. Mild aneurysmal dilatation of the popliteal artery measuring approximately 2.2 by 2.5 cm. Runoff: Normal posterior and anterior tibial arterial waveforms and velocities. Vessels are patent to the ankle. Left Lower Extremity ABI: 1.2 Inflow: Normal common femoral arterial waveforms and velocities. Scattered  heterogeneous atherosclerotic plaque. No evidence of inflow (aortoiliac) disease. Aneurysmal dilatation of the popliteal artery measuring up to 2.6 cm. Mild peripheral wall adherent thrombus versus fibrofatty plaque. Outflow: Normal profunda femoral, superficial femoral and popliteal arterial waveforms and velocities. Scattered heterogeneous atherosclerotic plaque no focal elevation of the PSV to suggest stenosis. Runoff: Normal posterior and anterior tibial arterial waveforms and velocities. Vessels are patent to the ankle. IMPRESSION: 1. Positive for bilateral popliteal artery aneurysms measuring up to 2.5 cm on the right and 2.6 cm on the left. Mild, smooth peripheral wall adherent thrombus versus fibrofatty plaque. 2. Scattered heterogeneous atherosclerotic plaque throughout the other visualized arteries without evidence of significant stenosis or occlusion. 3. Normal  bilateral resting ankle-brachial indices. Signed, Criselda Peaches, MD [facility] Radiology Electronically Signed   By: Jacqulynn Cadet M.D.   On: 05/19/2017 09:01   Ct Angio Chest Aorta W &/or Wo Contrast  Result Date: 05/12/2017 CLINICAL DATA:  57 year old male with a history of multifocal aneurysm disease and prior coronary artery bypass graft with saphenous graft aneurysm. EXAM: CT ANGIOGRAPHY CHEST WITH CONTRAST TECHNIQUE: Multidetector CT imaging of the chest was performed using the standard protocol during bolus administration of intravenous contrast. Multiplanar CT image reconstructions and MIPs were obtained to evaluate the vascular anatomy. CONTRAST:  100 mL Isovue 370 COMPARISON:  Multiple prior CT scans of the abdomen and pelvis, most recently 04/25/2017, more remotely 03/05/2013 FINDINGS: Cardiovascular: Slow interval enlargement of a fusiform aneurysmal dilatation of the native right coronary artery. The maximal diameter of the aneurysm measures 4.6 x 4.0 cm compared to 2.8 x 2.4 cm in November of 2014. Patient is status post multivessel CABG with aortic to diagonal and aortic to posterior descending saphenous venous bypass grafts. Atherosclerotic calcifications are present throughout the native coronary arteries. The aorta is normal in caliber. Conventional 3 vessel arch anatomy. The pulmonary arteries are normal in size. Mild cardiomegaly. No pericardial effusion. Mediastinum/Nodes: Unremarkable CT appearance of the thyroid gland. No suspicious mediastinal or hilar adenopathy. No soft tissue mediastinal mass. The thoracic esophagus is unremarkable. Lungs/Pleura: Lungs are clear. No pleural effusion or pneumothorax. Upper Abdomen: Surgical changes of prior gastric bypass surgery. No acute abnormality in the upper abdomen. Musculoskeletal: No acute fracture or aggressive appearing lytic or blastic osseous lesion. Healed median sternotomy.  Review of the MIP images confirms the above findings. IMPRESSION: 1. Enlarging right native coronary artery aneurysm now measuring up to 4.6 cm in greatest diameter compared to 2.8 cm in November of 2014. 2. Coronary artery calcifications. 3. Patent 2 vessel CABG. 4. Mild cardiomegaly. Signed, Criselda Peaches, MD [facility] Radiology Electronically Signed   By: Jacqulynn Cadet M.D.   On: 05/12/2017 12:42   Ir Radiologist Eval & Mgmt  Result Date: 05/11/2017 Please refer to notes tab for details about interventional procedure. (Op Note)   Labs:  CBC: Recent Labs    11/25/16 1046 11/26/16 0325 11/27/16 0511  WBC 4.1 3.9* 3.6*  HGB 9.6* 8.9* 9.2*  HCT 28.6* 28.0* 28.7*  PLT 137* 128* 124*    COAGS: Recent Labs    11/25/16 1358  INR 1.10    BMP: Recent Labs    11/25/16 1046 11/26/16 0325 11/27/16 0511 05/20/17 0607  NA 136 138 138 137  K 4.3 4.0 4.0 3.8  CL 104 105 107 105  CO2 23 24 24 22   GLUCOSE 123* 102* 98 93  BUN 12 15 17 17   CALCIUM  8.6* 8.3* 8.2* 8.4*  CREATININE 1.27* 1.36* 1.35* 1.22  GFRNONAA >60 57* 57* >60  GFRAA >60 >60 >60 >60    LIVER FUNCTION TESTS: No results for input(s): BILITOT, AST, ALT, ALKPHOS, PROT, ALBUMIN in the last 8760 hours.  TUMOR MARKERS: No results for input(s): AFPTM, CEA, CA199, CHROMGRNA in the last 8760 hours.  Assessment and Plan:  New abdominal aorta aneurysm endoleak Scheduled for endoleak repair/embolization in IR  Risks and benefits of endoleak embolization were discussed with the patient including, but not limited to bleeding, infection, vascular injury or contrast induced renal failure. This interventional procedure involves the use of X-rays and because of the nature of the planned procedure, it is possible that we will have prolonged use of X-ray fluoroscopy. Potential radiation risks to you include (but are not limited to) the following: - A slightly elevated  risk for cancer  several years later in life. This risk is typically less than 0.5% percent. This risk is low in comparison to the normal incidence of human cancer, which is 33% for women and 50% for men according to the [place]. - Radiation induced injury can include skin redness, resembling a rash, tissue breakdown / ulcers and hair loss (which can be temporary or permanent).  The likelihood of either of these occurring depends on the difficulty of the procedure and whether you are sensitive to radiation due to previous procedures, disease, or genetic conditions.  IF your procedure requires a prolonged use of radiation, you will be notified and given written instructions for further action.  It is your responsibility to monitor the irradiated area for the 2 weeks following the procedure and to notify your physician if you are concerned that you have suffered a radiation induced injury.    All of the patient's questions were answered, patient is agreeable to proceed. Consent signed and in chart.  Thank you for this interesting consult.  I greatly enjoyed meeting Lance Huar[place] and look forward to participating in their care.  A copy of this report was sent to the requesting provider on this date.  Electronically Signed: Lavonia Drafts, PA-C 05/20/2017, 6:50 AM   I spent a total of    25 Minutes in face to face in clinical consultation, greater than 50% of which was counseling/coordinating care for endoleak embolization

## 2017-05-20 NOTE — Anesthesia Postprocedure Evaluation (Signed)
Anesthesia Post Note  Patient: Daniel Blevins  Procedure(s) Performed: Endoleak Repair (N/A )     Patient location during evaluation: PACU Anesthesia Type: General Level of consciousness: awake and alert Pain management: pain level controlled Vital Signs Assessment: post-procedure vital signs reviewed and stable Respiratory status: spontaneous breathing, nonlabored ventilation and respiratory function stable Cardiovascular status: blood pressure returned to baseline and stable Postop Assessment: no apparent nausea or vomiting Anesthetic complications: no    Last Vitals:  Vitals:   05/20/17 1140 05/20/17 1155  BP: (!) 117/93 139/84  Pulse: (!) 57 60  Resp: 18 18  Temp:    SpO2: 95% 95%    Last Pain:  Vitals:   05/20/17 1125  TempSrc:   PainSc: 0-No pain                 Shawn Carattini,W. EDMOND

## 2017-05-21 NOTE — Discharge Summary (Signed)
Patient ID: Daniel Blevins MRN: [id number] DOB/AGE: Dec 08, 1960 57 y.o.  Admit date: 05/20/2017 Discharge date: 05/21/2017  Supervising Physician: Jacqulynn Cadet  Patient Status: [facility] - In-pt  Admission Diagnoses: Aortic endoleak  Discharge Diagnoses:  Active Problems:   Endoleak post (EVAR) endovascular aneurysm repair, sequela   Discharged Condition: good  Hospital Course: Patient presented to [facility] radiology department for aortic endoleak repair.  Patient previously met with Dr. Laurence Ferrari in Interventional Radiology clinic to discuss management and procedure. He presented today in his usual state of health and without complaint.  He underwent direct stick aortic endoleak repair without complication.  He recovered well over the course of the afternoon. Initial plan was for patient to remain in observation overnight, however he has completed bedrest, tolerated a PO diet, been able to void without difficulty, and is now ambulating well in his room.  He requests discharge home.  This was discussed with Dr. Laurence Ferrari who agrees patient is stable for discharge home tonight. Patient is to continue taking aspirin 81 mg daily.  He will be seen in follow-up in outpatient Interventional Radiology clinic.  Schedulers will contact him with date and time of appointment.  Patient is aware of care plan and agreeable.     Discharge Exam: Blood pressure 121/78, pulse 73, temperature 98.4 F (36.9 C), temperature source Oral, resp. rate 18, weight 263 lb (119.3 kg), SpO2 96 %. General appearance: alert, cooperative and no distress Resp: clear to auscultation bilaterally Cardio: regular rate and rhythm, S1, S2 normal, no murmur, click, rub or gallop GI: soft, non-tender; bowel sounds normal; no masses,  no organomegaly Skin: Skin color, texture, turgor normal. No rashes or lesions or erythema.  Puncture site intact.  Non-tender.  Disposition: 01-Home or Self Care  Discharge Instructions    Call MD  for:  persistant dizziness or light-headedness   Complete by:  As directed    Call MD for:  persistant nausea and vomiting   Complete by:  As directed    Call MD for:  severe uncontrolled pain   Complete by:  As directed    Call MD for:  temperature >100.4   Complete by:  As directed    Diet - low sodium heart healthy   Complete by:  As directed    Discharge instructions   Complete by:  As directed    Change dressing on back as needed to keep puncture site clean and dry.  Continue taking aspirin 81 mg daily.  See Dr. Laurence Ferrari in follow-up in Interventional Radiology clinic.  Schedulers will contact you with date and time of appointment.   Driving Restrictions   Complete by:  As directed    No driving today or tomorrow.   Increase activity slowly   Complete by:  As directed      Allergies as of 05/20/2017   No Known Allergies     Medication List    TAKE these medications   amLODipine 10 MG tablet Commonly known as:  NORVASC Take 10 mg by mouth daily.   ARIPiprazole 2 MG tablet Commonly known as:  ABILIFY Take 2 mg by mouth daily.   aspirin 81 MG tablet Take 81 mg by mouth every morning.   buPROPion 300 MG 24 hr tablet Commonly known as:  WELLBUTRIN XL Take 300 mg by mouth daily.   carvedilol 25 MG tablet Commonly known as:  COREG Take 1 tablet (25 mg total) by mouth 2 (two) times daily with a meal.   cyanocobalamin 1000  MCG tablet Take 1 tablet (1,000 mcg total) by mouth daily.   lisinopril 40 MG tablet Commonly known as:  PRINIVIL,ZESTRIL Take 1 tablet (40 mg total) every morning by mouth.   naltrexone 50 MG tablet Commonly known as:  DEPADE Take 50 mg by mouth daily.   simvastatin 20 MG tablet Commonly known as:  ZOCOR Take 1 tablet (20 mg total) by mouth every morning.         Electronically Signed: Docia Barrier, PA 05/21/2017, 12:21 PM   I have spent Greater Than 30 Minutes discharging [name].

## 2017-05-23 ENCOUNTER — Encounter (HOSPITAL_COMMUNITY): Payer: Self-pay | Admitting: Interventional Radiology

## 2017-06-02 ENCOUNTER — Encounter: Payer: Self-pay | Admitting: Interventional Cardiology

## 2017-06-03 ENCOUNTER — Ambulatory Visit (INDEPENDENT_AMBULATORY_CARE_PROVIDER_SITE_OTHER): Payer: 59 | Admitting: Interventional Cardiology

## 2017-06-03 ENCOUNTER — Encounter: Payer: Self-pay | Admitting: Interventional Cardiology

## 2017-06-03 VITALS — BP 122/80 | HR 79 | Ht 71.0 in | Wt 258.4 lb

## 2017-06-03 DIAGNOSIS — I10 Essential (primary) hypertension: Secondary | ICD-10-CM | POA: Diagnosis not present

## 2017-06-03 DIAGNOSIS — D696 Thrombocytopenia, unspecified: Secondary | ICD-10-CM

## 2017-06-03 DIAGNOSIS — I25700 Atherosclerosis of coronary artery bypass graft(s), unspecified, with unstable angina pectoris: Secondary | ICD-10-CM

## 2017-06-03 DIAGNOSIS — T82330A Leakage of aortic (bifurcation) graft (replacement), initial encounter: Secondary | ICD-10-CM | POA: Diagnosis not present

## 2017-06-03 DIAGNOSIS — I2541 Coronary artery aneurysm: Secondary | ICD-10-CM | POA: Insufficient documentation

## 2017-06-03 DIAGNOSIS — IMO0002 Reserved for concepts with insufficient information to code with codable children: Secondary | ICD-10-CM

## 2017-06-03 MED ORDER — ROSUVASTATIN CALCIUM 40 MG PO TABS: 40.0000 mg | ORAL_TABLET | Freq: Every day | ORAL | 3 refills | Status: DC

## 2017-06-03 NOTE — Progress Notes (Signed)
Cardiology Office Note    Date:  06/03/2017   ID:  Daniel Blevins, DOB May 28, 1960, MRN [id number]  PCP:  Lajean Manes, MD  Cardiologist: Sinclair Grooms, MD   Chief Complaint  Patient presents with  . Coronary Artery Disease    History of Present Illness:  Daniel Blevins is a 57 y.o. male for follow-up of coronary artery disease with bypass surgery 2001, history of abdominal aortic aneurysm treated with EVAR 2015, and subsequent repeat procedures for endoleak. Recent emergency room visit an overnight hospital stay for chest pain. Low risk myocardial perfusion study.  There is a possible saphenous vein graft.  Daniel Blevins no cardiac symptoms.  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  The patient has prior history of bypass grafting with known LIMA to LAD that can be seen on the CT scans.  No aortic rings are noted to suggest saphenous vein graft sites.  Despite absence of symptoms, this growing a coronary aneurysm is at risk for rupture.   Past Medical History:  Diagnosis Date  . AKI (acute kidney injury) ([place])   . Chest pain   . Coronary artery disease   . Deafness in right ear   . Depression   . Dysrhythmia   . 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 History of kidney stones   . Hx of CABG   . Hypertension   . Sleep apnea    no longer is an issue  . Urinary frequency     Past Surgical History:  Procedure Laterality Date  . ABDOMINAL AORTIC ENDOVASCULAR STENT GRAFT N/A 04/30/2014   Procedure: ABDOMINAL AORTIC ENDOVASCULAR STENT GRAFT;  Surgeon: Elam Dutch, MD;  Location: [facility] OR;  Service: Vascular;  Laterality: N/A;  . ABDOMINAL AORTIC ENDOVASCULAR STENT GRAFT N/A 05/26/2015   Procedure: ABDOMINAL AORTIC ENDOVASCULAR STENT GRAFT RE-INTERVENTION; REPAIR OF PROXIMAL CUFF;  Surgeon: Elam Dutch, MD;   Location: [facility];  Service: Vascular;  Laterality: N/A;  . BACK SURGERY  2009   lower back  . CARDIAC CATHETERIZATION    . COLONOSCOPY    . CORONARY ARTERY BYPASS GRAFT  2001  . CYSTOSCOPY WITH RETROGRADE PYELOGRAM, URETEROSCOPY AND STENT PLACEMENT Bilateral 02/07/2014   Procedure: CYSTOSCOPY WITH BILATERAL RETROGRADE PYELOGRAM, BILATERAL URETEROSCOPY, RIGHT EXTRACTION OF STONES AND RIGHT STENT PLACEMENT;  Surgeon: Jorja Loa, MD;  Location: [facility];  Service: Urology;  Laterality: Bilateral;  . ESOPHAGOGASTRODUODENOSCOPY    . IR AORTAGRAM ABDOMINAL SERIALOGRAM  05/20/2017  . IR CT SPINE LTD  05/20/2017  . IR EMBO ARTERIAL NOT HEMORR HEMANG INC GUIDE ROADMAPPING  05/20/2017  . IR GENERIC HISTORICAL  11/06/2015   IR RADIOLOGIST EVAL & MGMT 11/06/2015 Jacqulynn Cadet, MD [facility] INTERV RAD  . IR GENERIC HISTORICAL  04/20/2016   IR RADIOLOGIST EVAL & MGMT 04/20/2016 Jacqulynn Cadet, MD [facility] INTERV RAD  . IR RADIOLOGIST EVAL & MGMT  05/11/2017  . LAPAROSCOPIC GASTRIC BYPASS  2010   [facility]  . LAPAROSCOPIC LYSIS OF ADHESIONS N/A 03/06/2013   Procedure: LAPAROSCOPIC LYSIS OF ADHESIONS AND CLOSURE OF INTERNAL HERNIAS x2;  Surgeon: Adin Hector, MD;  Location: [facility];  Service: General;  Laterality: N/A;  . LAPAROSCOPY N/A 03/06/2013   Procedure: LAPAROSCOPY DIAGNOSTIC;  Surgeon: Adin Hector, MD;  Location: [facility];  Service: General;  Laterality: N/A;  . PERIPHERAL  VASCULAR CATHETERIZATION Right 04/30/2014   Procedure: EMBOLIZATION right internal iliac;  Surgeon: Elam Dutch, MD;  Location: [facility];  Service: Vascular;  Laterality: Right;  . RADIOLOGY WITH ANESTHESIA N/A 05/20/2017   Procedure: Endoleak Repair;  Surgeon: Jacqulynn Cadet, MD;  Location: [facility];  Service: Radiology;  Laterality: N/A;    Current Medications: Outpatient Medications Prior to Visit  Medication Sig Dispense Refill  . amLODipine (NORVASC) 10 MG tablet Take 10 mg by mouth daily.    . ARIPiprazole  (ABILIFY) 2 MG tablet Take 2 mg by mouth daily.    Marland Kitchen aspirin 81 MG tablet Take 81 mg by mouth every morning.     Marland Kitchen buPROPion (WELLBUTRIN XL) 300 MG 24 hr tablet Take 300 mg by mouth daily.    . carvedilol (COREG) 25 MG tablet Take 1 tablet (25 mg total) by mouth 2 (two) times daily with a meal. 60 tablet 0  . lisinopril (PRINIVIL,ZESTRIL) 40 MG tablet Take 1 tablet (40 mg total) every morning by mouth. 90 tablet 2  . naltrexone (DEPADE) 50 MG tablet Take 50 mg by mouth daily.    . simvastatin (ZOCOR) 20 MG tablet Take 1 tablet (20 mg total) by mouth every morning. 90 tablet 3  . vitamin B-12 1000 MCG tablet Take 1 tablet (1,000 mcg total) by mouth daily. (Patient not taking: Reported on 06/03/2017) 30 tablet 0   No facility-administered medications prior to visit.      Allergies:   Patient has no known allergies.   Social History   Socioeconomic History  . Marital status: Married    Spouse name: None  . Number of children: None  . Years of education: None  . Highest education level: None  Social Needs  . Financial resource strain: None  . Food insecurity - worry: None  . Food insecurity - inability: None  . Transportation needs - medical: None  . Transportation needs - non-medical: None  Occupational History  . None  Tobacco Use  . Smoking status: Never Smoker  . Smokeless tobacco: Never Used  Substance and Sexual Activity  . Alcohol use: Yes    Comment: occasional  . Drug use: No  . Sexual activity: No  Other Topics Concern  . None  Social History Narrative  . None     Family History:  The patient's family history includes AAA (abdominal aortic aneurysm) in his father; Cancer in his mother; Heart disease in his brother and father; Lung cancer in his mother.   ROS:   Please see the history of present illness.    Pression, anxiety, ran for [employer] this past midterm but did not when.  Daniel Blevins is optimistic and states that there is always next  time. All other systems reviewed and are negative.   PHYSICAL EXAM:   VS:  BP 122/80   Pulse 79   Ht 5\' 11"  (1.803 m)   Wt 258 lb 6.4 oz (117.2 kg)   BMI 36.04 kg/m    GEN: Well nourished, well developed, in no acute distress  HEENT: normal  Neck: no JVD, carotid bruits, or masses Cardiac: RRR; no murmurs, rubs, or gallops,no edema 2+ radial pulses bilaterally. Respiratory:  clear to auscultation bilaterally, normal work of breathing GI: soft, nontender, nondistended, + BS MS: no deformity or atrophy  Skin: warm and dry, no rash Neuro:  Alert and Oriented x 3, Strength and sensation are intact Psych: euthymic mood, full affect  Wt Readings from Last  3 Encounters:  06/03/17 258 lb 6.4 oz (117.2 kg)  05/20/17 263 lb (119.3 kg)  05/11/17 262 lb (118.8 kg)      Studies/Labs Reviewed:   EKG:  EKG sinus rhythm with inferior Q waves extending to the lateral precordial leads V5 and 6.  Occasional PVC.  Otherwise unremarkable.  Recent Labs: 05/20/2017: BUN 17; Creatinine, Ser 1.22; Hemoglobin 11.2; Platelets 145; Potassium 3.8; Sodium 137   Lipid Panel    Component Value Date/Time   CHOL 204 (H) 11/25/2016 1349   TRIG 68 11/25/2016 1349   HDL 74 11/25/2016 1349   CHOLHDL 2.8 11/25/2016 1349   VLDL 14 11/25/2016 1349   LDLCALC 116 (H) 11/25/2016 1349    Additional studies/ records that were reviewed today include:  CT angiogram of the chest 05/12/2017: IMPRESSION: 1. Enlarging right native coronary artery aneurysm now measuring up to 4.6 cm in greatest diameter compared to 2.8 cm in November of 2014. 2. Coronary artery calcifications. 3. Patent 2 vessel CABG. 4. Mild cardiomegaly.      ASSESSMENT:    1. Coronary artery disease involving coronary bypass graft of native heart with unstable angina pectoris ([place])   2. Endoleak of aortic graft ([place])   3. Essential hypertension   4. Thrombocytopenia ([place])   5. Coronary aneurysm      PLAN:  In order of problems  listed above:  1. Dr. Magdalen Spatz phone - [phone] in [place] Daniel Blevins has his prior cardiologist.  We will attempt to get information about bypass graft sites from the surgical note.  Daniel Blevins is not having angina.  I have advised that Daniel Blevins undergo coronary angiography to definitively analyzed the right coronary saccular aneurysm that now is markedly enlarged compared to prior data.  This will help define management strategy. 2. Recent plugging by interventional radiology was apparently successful. 3. Excellent blood pressure control. 4. Most recent platelet count from January 18 was 149,000. 5. I have recommended diagnostic coronary angiography to define this problem of the right coronary mid segment.  The patient was counseled to undergo left heart catheterization, coronary angiography, and possible percutaneous coronary intervention with stent implantation. The procedural risks and benefits were discussed in detail. The risks discussed included death, stroke, myocardial infarction, life-threatening bleeding, limb ischemia, kidney injury, allergy, and possible emergency cardiac surgery. The risk of these significant complications were estimated to occur less than 1% of the time. After discussion, the patient has agreed to proceed.  Medication Adjustments/Labs and Tests Ordered: Current medicines are reviewed at length with the patient today.  Concerns regarding medicines are outlined above.  Medication changes, Labs and Tests ordered today are listed in the Patient Instructions below. Patient Instructions  Medication Instructions:  1) DISCONTINUE Simvastatin 2) START Rosuvastatin 40mg  once daily  Labwork: BMET, CBC and INR today  Your physician recommends that you return for lab work in: 2 months (Lipid, liver)  Please come to this appointment fasting.   Testing/Procedures: Your physician has requested that you have a cardiac catheterization. Cardiac catheterization is used to  diagnose and/or treat various heart conditions. Doctors may recommend this procedure for a number of different reasons. The most common reason is to evaluate chest pain. Chest pain can be a symptom of coronary artery disease (CAD), and cardiac catheterization can show whether plaque is narrowing or blocking your heart's arteries. This procedure is also used to evaluate the valves, as well as measure the blood flow and oxygen levels in different parts of your heart. For further  information please visit HugeFiesta.[email or web address]. Please follow instruction sheet, as given.    Follow-Up: Your physician recommends that you schedule a follow-up appointment in: 2 weeks after heart cath (scheduled 2/7) with a PA or NP.   Any Other Special Instructions Will Be Listed Below (If Applicable).    [place] OFFICE [address] [place] [address] Dept: [phone] Loc: [phone]  Daniel Blevins  06/03/2017  You are scheduled for a Cardiac Catheterization on Thursday, February 7 with Dr. Daneen Schick.  1. Please arrive at the [facility] (Main Entrance A) at [facility]: [address] [place], [place] [address] at 11:30 AM (two hours before your procedure to ensure your preparation). Free valet parking service is available.   Special note: Every effort is made to have your procedure done on time. Please understand that emergencies sometimes delay scheduled procedures.  2. Diet: Do not eat or drink anything after midnight prior to your procedure except sips of water to take medications.  3. Labs: Your labs will be drawn today in the office.   4. Medication instructions in preparation for your procedure:  *For reference purposes while preparing patient instructions.   Delete this med list prior to printing instructions for patient.*   On the morning of your procedure, take your Aspirin and any morning  medicines NOT listed above.  You may use sips of water.  5. Plan for one night stay--bring personal belongings. 6. Bring a current list of your medications and current insurance cards. 7. You MUST have a responsible person to drive you home. 8. Someone MUST be with you the first 24 hours after you arrive home or your discharge will be delayed. 9. Please wear clothes that are easy to get on and off and wear slip-on shoes.  Thank you for allowing [place] to care for you!   -- [place] [facility]    If you need a refill on your cardiac medications before your next appointment, please call your pharmacy.      Signed, Sinclair Grooms, MD  06/03/2017 5:47 PM    [facility] [place], [place], [place]  [address] Phone: [phone]; Fax: [phone]

## 2017-06-03 NOTE — Patient Instructions (Addendum)
Medication Instructions:  1) DISCONTINUE Simvastatin 2) START Rosuvastatin 40mg  once daily  Labwork: BMET, CBC and INR today  Your physician recommends that you return for lab work in: 2 months (Lipid, liver)  Please come to this appointment fasting.   Testing/Procedures: Your physician has requested that you have a cardiac catheterization. Cardiac catheterization is used to diagnose and/or treat various heart conditions. Doctors may recommend this procedure for a number of different reasons. The most common reason is to evaluate chest pain. Chest pain can be a symptom of coronary artery disease (CAD), and cardiac catheterization can show whether plaque is narrowing or blocking your heart's arteries. This procedure is also used to evaluate the valves, as well as measure the blood flow and oxygen levels in different parts of your heart. For further information please visit HugeFiesta.tn. Please follow instruction sheet, as given.    Follow-Up: Your physician recommends that you schedule a follow-up appointment in: 2 weeks after heart cath (scheduled 2/7) with a PA or NP.   Any Other Special Instructions Will Be Listed Below (If Applicable).    [facility] OFFICE [address] [place] [address] Dept: [phone] Loc: [phone]  Daniel Blevins  06/03/2017  You are scheduled for a Cardiac Catheterization on Thursday, February 7 with Dr. Daneen Schick.  1. Please arrive at the [facility] (Main Entrance A) at [facility]: [address] [place], [place] [address] at 11:30 AM (two hours before your procedure to ensure your preparation). Free valet parking service is available.   Special note: Every effort is made to have your procedure done on time. Please understand that emergencies sometimes delay scheduled procedures.  2. Diet: Do not eat or drink anything after midnight prior to  your procedure except sips of water to take medications.  3. Labs: Your labs will be drawn today in the office.   4. Medication instructions in preparation for your procedure:  *For reference purposes while preparing patient instructions.   Delete this med list prior to printing instructions for patient.*   On the morning of your procedure, take your Aspirin and any morning medicines NOT listed above.  You may use sips of water.  5. Plan for one night stay--bring personal belongings. 6. Bring a current list of your medications and current insurance cards. 7. You MUST have a responsible person to drive you home. 8. Someone MUST be with you the first 24 hours after you arrive home or your discharge will be delayed. 9. Please wear clothes that are easy to get on and off and wear slip-on shoes.  Thank you for allowing [place] to care for you!   -- [place] [facility]    If you need a refill on your cardiac medications before your next appointment, please call your pharmacy.

## 2017-06-03 NOTE — H&P (View-Only) (Signed)
Cardiology Office Note    Date:  06/03/2017   ID:  Daniel Blevins, DOB Dec 03, 1960, MRN [id number]  PCP:  Lajean Manes, MD  Cardiologist: Sinclair Grooms, MD   Chief Complaint  Patient presents with  . Coronary Artery Disease    History of Present Illness:  Daniel Blevins is a 57 y.o. male for follow-up of coronary artery disease with bypass surgery 2001, history of abdominal aortic aneurysm treated with EVAR 2015, and subsequent repeat procedures for endoleak. Recent emergency room visit an overnight hospital stay for chest pain. Low risk myocardial perfusion study.  There is a possible saphenous vein graft.  Daniel Blevins no cardiac symptoms.  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  The patient has prior history of bypass grafting with known LIMA to LAD that can be seen on the CT scans.  No aortic rings are noted to suggest saphenous vein graft sites.  Despite absence of symptoms, this growing a coronary aneurysm is at risk for rupture.   Past Medical History:  Diagnosis Date  . AKI (acute kidney injury) ([place])   . Chest pain   . Coronary artery disease   . Deafness in right ear   . Depression   . Dysrhythmia   . 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 History of kidney stones   . Hx of CABG   . Hypertension   . Sleep apnea    no longer is an issue  . Urinary frequency     Past Surgical History:  Procedure Laterality Date  . ABDOMINAL AORTIC ENDOVASCULAR STENT GRAFT N/A 04/30/2014   Procedure: ABDOMINAL AORTIC ENDOVASCULAR STENT GRAFT;  Surgeon: Elam Dutch, MD;  Location: [facility] OR;  Service: Vascular;  Laterality: N/A;  . ABDOMINAL AORTIC ENDOVASCULAR STENT GRAFT N/A 05/26/2015   Procedure: ABDOMINAL AORTIC ENDOVASCULAR STENT GRAFT RE-INTERVENTION; REPAIR OF PROXIMAL CUFF;  Surgeon: Elam Dutch, MD;   Location: [place];  Service: Vascular;  Laterality: N/A;  . BACK SURGERY  2009   lower back  . CARDIAC CATHETERIZATION    . COLONOSCOPY    . CORONARY ARTERY BYPASS GRAFT  2001  . CYSTOSCOPY WITH RETROGRADE PYELOGRAM, URETEROSCOPY AND STENT PLACEMENT Bilateral 02/07/2014   Procedure: CYSTOSCOPY WITH BILATERAL RETROGRADE PYELOGRAM, BILATERAL URETEROSCOPY, RIGHT EXTRACTION OF STONES AND RIGHT STENT PLACEMENT;  Surgeon: Jorja Loa, MD;  Location: [facility];  Service: Urology;  Laterality: Bilateral;  . ESOPHAGOGASTRODUODENOSCOPY    . IR AORTAGRAM ABDOMINAL SERIALOGRAM  05/20/2017  . IR CT SPINE LTD  05/20/2017  . IR EMBO ARTERIAL NOT HEMORR HEMANG INC GUIDE ROADMAPPING  05/20/2017  . IR GENERIC HISTORICAL  11/06/2015   IR RADIOLOGIST EVAL & MGMT 11/06/2015 Jacqulynn Cadet, MD [facility] INTERV RAD  . IR GENERIC HISTORICAL  04/20/2016   IR RADIOLOGIST EVAL & MGMT 04/20/2016 Jacqulynn Cadet, MD [facility] INTERV RAD  . IR RADIOLOGIST EVAL & MGMT  05/11/2017  . LAPAROSCOPIC GASTRIC BYPASS  2010   [facility]  . LAPAROSCOPIC LYSIS OF ADHESIONS N/A 03/06/2013   Procedure: LAPAROSCOPIC LYSIS OF ADHESIONS AND CLOSURE OF INTERNAL HERNIAS x2;  Surgeon: Adin Hector, MD;  Location: [facility];  Service: General;  Laterality: N/A;  . LAPAROSCOPY N/A 03/06/2013   Procedure: LAPAROSCOPY DIAGNOSTIC;  Surgeon: Adin Hector, MD;  Location: [facility];  Service: General;  Laterality: N/A;  . PERIPHERAL  VASCULAR CATHETERIZATION Right 04/30/2014   Procedure: EMBOLIZATION right internal iliac;  Surgeon: Elam Dutch, MD;  Location: [place];  Service: Vascular;  Laterality: Right;  . RADIOLOGY WITH ANESTHESIA N/A 05/20/2017   Procedure: Endoleak Repair;  Surgeon: Jacqulynn Cadet, MD;  Location: [place];  Service: Radiology;  Laterality: N/A;    Current Medications: Outpatient Medications Prior to Visit  Medication Sig Dispense Refill  . amLODipine (NORVASC) 10 MG tablet Take 10 mg by mouth daily.    . ARIPiprazole  (ABILIFY) 2 MG tablet Take 2 mg by mouth daily.    Marland Kitchen aspirin 81 MG tablet Take 81 mg by mouth every morning.     Marland Kitchen buPROPion (WELLBUTRIN XL) 300 MG 24 hr tablet Take 300 mg by mouth daily.    . carvedilol (COREG) 25 MG tablet Take 1 tablet (25 mg total) by mouth 2 (two) times daily with a meal. 60 tablet 0  . lisinopril (PRINIVIL,ZESTRIL) 40 MG tablet Take 1 tablet (40 mg total) every morning by mouth. 90 tablet 2  . naltrexone (DEPADE) 50 MG tablet Take 50 mg by mouth daily.    . simvastatin (ZOCOR) 20 MG tablet Take 1 tablet (20 mg total) by mouth every morning. 90 tablet 3  . vitamin B-12 1000 MCG tablet Take 1 tablet (1,000 mcg total) by mouth daily. (Patient not taking: Reported on 06/03/2017) 30 tablet 0   No facility-administered medications prior to visit.      Allergies:   Patient has no known allergies.   Social History   Socioeconomic History  . Marital status: Married    Spouse name: None  . Number of children: None  . Years of education: None  . Highest education level: None  Social Needs  . Financial resource strain: None  . Food insecurity - worry: None  . Food insecurity - inability: None  . Transportation needs - medical: None  . Transportation needs - non-medical: None  Occupational History  . None  Tobacco Use  . Smoking status: Never Smoker  . Smokeless tobacco: Never Used  Substance and Sexual Activity  . Alcohol use: Yes    Comment: occasional  . Drug use: No  . Sexual activity: No  Other Topics Concern  . None  Social History Narrative  . None     Family History:  The patient's family history includes AAA (abdominal aortic aneurysm) in his father; Cancer in his mother; Heart disease in his brother and father; Lung cancer in his mother.   ROS:   Please see the history of present illness.    Pression, anxiety, ran for [employer] this past midterm but did not when.  He is optimistic and states that there is always next  time. All other systems reviewed and are negative.   PHYSICAL EXAM:   VS:  BP 122/80   Pulse 79   Ht 5\' 11"  (1.803 m)   Wt 258 lb 6.4 oz (117.2 kg)   BMI 36.04 kg/m    GEN: Well nourished, well developed, in no acute distress  HEENT: normal  Neck: no JVD, carotid bruits, or masses Cardiac: RRR; no murmurs, rubs, or gallops,no edema 2+ radial pulses bilaterally. Respiratory:  clear to auscultation bilaterally, normal work of breathing GI: soft, nontender, nondistended, + BS MS: no deformity or atrophy  Skin: warm and dry, no rash Neuro:  Alert and Oriented x 3, Strength and sensation are intact Psych: euthymic mood, full affect  Wt Readings from Last  3 Encounters:  06/03/17 258 lb 6.4 oz (117.2 kg)  05/20/17 263 lb (119.3 kg)  05/11/17 262 lb (118.8 kg)      Studies/Labs Reviewed:   EKG:  EKG sinus rhythm with inferior Q waves extending to the lateral precordial leads V5 and 6.  Occasional PVC.  Otherwise unremarkable.  Recent Labs: 05/20/2017: BUN 17; Creatinine, Ser 1.22; Hemoglobin 11.2; Platelets 145; Potassium 3.8; Sodium 137   Lipid Panel    Component Value Date/Time   CHOL 204 (H) 11/25/2016 1349   TRIG 68 11/25/2016 1349   HDL 74 11/25/2016 1349   CHOLHDL 2.8 11/25/2016 1349   VLDL 14 11/25/2016 1349   LDLCALC 116 (H) 11/25/2016 1349    Additional studies/ records that were reviewed today include:  CT angiogram of the chest 05/12/2017: IMPRESSION: 1. Enlarging right native coronary artery aneurysm now measuring up to 4.6 cm in greatest diameter compared to 2.8 cm in November of 2014. 2. Coronary artery calcifications. 3. Patent 2 vessel CABG. 4. Mild cardiomegaly.      ASSESSMENT:    1. Coronary artery disease involving coronary bypass graft of native heart with unstable angina pectoris ([place])   2. Endoleak of aortic graft ([place])   3. Essential hypertension   4. Thrombocytopenia ([place])   5. Coronary aneurysm      PLAN:  In order of problems  listed above:  1. Dr. Magdalen Spatz phone - [phone] in [place] he has his prior cardiologist.  We will attempt to get information about bypass graft sites from the surgical note.  He is not having angina.  I have advised that he undergo coronary angiography to definitively analyzed the right coronary saccular aneurysm that now is markedly enlarged compared to prior data.  This will help define management strategy. 2. Recent plugging by interventional radiology was apparently successful. 3. Excellent blood pressure control. 4. Most recent platelet count from January 18 was 149,000. 5. I have recommended diagnostic coronary angiography to define this problem of the right coronary mid segment.  The patient was counseled to undergo left heart catheterization, coronary angiography, and possible percutaneous coronary intervention with stent implantation. The procedural risks and benefits were discussed in detail. The risks discussed included death, stroke, myocardial infarction, life-threatening bleeding, limb ischemia, kidney injury, allergy, and possible emergency cardiac surgery. The risk of these significant complications were estimated to occur less than 1% of the time. After discussion, the patient has agreed to proceed.  Medication Adjustments/Labs and Tests Ordered: Current medicines are reviewed at length with the patient today.  Concerns regarding medicines are outlined above.  Medication changes, Labs and Tests ordered today are listed in the Patient Instructions below. Patient Instructions  Medication Instructions:  1) DISCONTINUE Simvastatin 2) START Rosuvastatin 40mg  once daily  Labwork: BMET, CBC and INR today  Your physician recommends that you return for lab work in: 2 months (Lipid, liver)  Please come to this appointment fasting.   Testing/Procedures: Your physician has requested that you have a cardiac catheterization. Cardiac catheterization is used to  diagnose and/or treat various heart conditions. Doctors may recommend this procedure for a number of different reasons. The most common reason is to evaluate chest pain. Chest pain can be a symptom of coronary artery disease (CAD), and cardiac catheterization can show whether plaque is narrowing or blocking your heart's arteries. This procedure is also used to evaluate the valves, as well as measure the blood flow and oxygen levels in different parts of your heart. For further  information please visit [email or web address]. Please follow instruction sheet, as given.    Follow-Up: Your physician recommends that you schedule a follow-up appointment in: 2 weeks after heart cath (scheduled 2/7) with a PA or NP.   Any Other Special Instructions Will Be Listed Below (If Applicable).    [facility] [address], [place] [address] Dept: [phone] Loc: [phone]  Daniel Blevins  06/03/2017  You are scheduled for a Cardiac Catheterization on Thursday, February 7 with Dr. Daneen Schick.  1. Please arrive at the [facility] (Main Entrance A) at [facility]: [address] [place], [place] [address] at 11:30 AM (two hours before your procedure to ensure your preparation). Free valet parking service is available.   Special note: Every effort is made to have your procedure done on time. Please understand that emergencies sometimes delay scheduled procedures.  2. Diet: Do not eat or drink anything after midnight prior to your procedure except sips of water to take medications.  3. Labs: Your labs will be drawn today in the office.   4. Medication instructions in preparation for your procedure:  *For reference purposes while preparing patient instructions.   Delete this med list prior to printing instructions for patient.*   On the morning of your procedure, take your Aspirin and any morning  medicines NOT listed above.  You may use sips of water.  5. Plan for one night stay--bring personal belongings. 6. Bring a current list of your medications and current insurance cards. 7. You MUST have a responsible person to drive you home. 8. Someone MUST be with you the first 24 hours after you arrive home or your discharge will be delayed. 9. Please wear clothes that are easy to get on and off and wear slip-on shoes.  Thank you for allowing [place] to care for you!   -- [facility]    If you need a refill on your cardiac medications before your next appointment, please call your pharmacy.      Signed, Sinclair Grooms, MD  06/03/2017 5:47 PM    [facility] [place], [place], [place]  [address] Phone: [phone]; Fax: [phone]

## 2017-06-04 LAB — CBC
Hematocrit: 37.1 % — ABNORMAL LOW (ref 37.5–51.0)
Hemoglobin: 11.8 g/dL — ABNORMAL LOW (ref 13.0–17.7)
MCH: 25.9 pg — AB (ref 26.6–33.0)
MCHC: 31.8 g/dL (ref 31.5–35.7)
MCV: 82 fL (ref 79–97)
PLATELETS: 187 10*3/uL (ref 150–379)
RBC: 4.55 x10E6/uL (ref 4.14–5.80)
RDW: 16.6 % — AB (ref 12.3–15.4)
WBC: 4.9 10*3/uL (ref 3.4–10.8)

## 2017-06-04 LAB — PROTIME-INR
INR: 1 (ref 0.8–1.2)
Prothrombin Time: 10.5 s (ref 9.1–12.0)

## 2017-06-04 LAB — BASIC METABOLIC PANEL
BUN/Creatinine Ratio: 16 (ref 9–20)
BUN: 19 mg/dL (ref 6–24)
CO2: 22 mmol/L (ref 20–29)
Calcium: 8.9 mg/dL (ref 8.7–10.2)
Chloride: 102 mmol/L (ref 96–106)
Creatinine, Ser: 1.22 mg/dL (ref 0.76–1.27)
GFR, EST AFRICAN AMERICAN: 76 mL/min/{1.73_m2} (ref >59)
GFR, EST NON AFRICAN AMERICAN: 66 mL/min/{1.73_m2} (ref >59)
Glucose: 92 mg/dL (ref 65–99)
POTASSIUM: 4.2 mmol/L (ref 3.5–5.2)
SODIUM: 142 mmol/L (ref 134–144)

## 2017-06-06 ENCOUNTER — Telehealth: Payer: Self-pay | Admitting: Interventional Cardiology

## 2017-06-06 NOTE — Telephone Encounter (Signed)
Patient calling to r/s medical procedure

## 2017-06-06 NOTE — Telephone Encounter (Signed)
Spoke with pt and he wanted to see about rescheduling cath because he didn't want to fast that long.  Advised pt Dr. Thompson Caul next day in the cath lab after the 7th is the 11th.  Pt decided to proceed with having cath this Thurs.  Pt appreciative for call.

## 2017-06-07 ENCOUNTER — Telehealth: Payer: Self-pay | Admitting: *Deleted

## 2017-06-07 NOTE — Telephone Encounter (Signed)
Cardiac Cath scheduled for: Thursday February 7,2019 1:30 PM [facility] Entrance A/[place] at: 11:30 AM Allergies: No known allergies-confirmed with patient. Diabetes: No diabetes-confirmed with patient.  HOLD medications: NONE   AM meds can be  taken pre-cath with sip of water including: ASA 81 mg am of procedure.  Nothing to eat or drink after midnight. Confirmed patient has responsible person to drive home post procedure and observe patient for 24 hours:   Pt asked me to call him back about 1 PM to discuss in more detail, he was in the car on the way to an appointment.

## 2017-06-07 NOTE — Telephone Encounter (Signed)
I spoke with patient, confirmed and discussed heart cath instructions, pt verbalized understanding, thanked me for the call.

## 2017-06-09 ENCOUNTER — Ambulatory Visit (HOSPITAL_COMMUNITY)
Admission: RE | Disposition: A | Discharge status: Home / Self Care | Payer: Self-pay | Source: Ambulatory Visit | Attending: Interventional Cardiology

## 2017-06-09 ENCOUNTER — Encounter (HOSPITAL_COMMUNITY): Payer: Self-pay

## 2017-06-09 ENCOUNTER — Ambulatory Visit (HOSPITAL_COMMUNITY)
Admission: RE | Admit: 2017-06-09 | Discharge: 2017-06-09 | Disposition: A | Discharge status: Home / Self Care | Payer: 59 | Source: Ambulatory Visit | Attending: Interventional Cardiology | Admitting: Interventional Cardiology

## 2017-06-09 DIAGNOSIS — Z9884 Bariatric surgery status: Secondary | ICD-10-CM | POA: Diagnosis not present

## 2017-06-09 DIAGNOSIS — IMO0002 Reserved for concepts with insufficient information to code with codable children: Secondary | ICD-10-CM | POA: Diagnosis present

## 2017-06-09 DIAGNOSIS — F329 Major depressive disorder, single episode, unspecified: Secondary | ICD-10-CM | POA: Insufficient documentation

## 2017-06-09 DIAGNOSIS — G4730 Sleep apnea, unspecified: Secondary | ICD-10-CM | POA: Insufficient documentation

## 2017-06-09 DIAGNOSIS — T82330A Leakage of aortic (bifurcation) graft (replacement), initial encounter: Secondary | ICD-10-CM | POA: Diagnosis present

## 2017-06-09 DIAGNOSIS — D696 Thrombocytopenia, unspecified: Secondary | ICD-10-CM | POA: Diagnosis present

## 2017-06-09 DIAGNOSIS — Z7982 Long term (current) use of aspirin: Secondary | ICD-10-CM | POA: Diagnosis not present

## 2017-06-09 DIAGNOSIS — I2541 Coronary artery aneurysm: Secondary | ICD-10-CM | POA: Diagnosis present

## 2017-06-09 DIAGNOSIS — I25810 Atherosclerosis of coronary artery bypass graft(s) without angina pectoris: Secondary | ICD-10-CM | POA: Diagnosis not present

## 2017-06-09 DIAGNOSIS — I2582 Chronic total occlusion of coronary artery: Secondary | ICD-10-CM | POA: Diagnosis not present

## 2017-06-09 DIAGNOSIS — I10 Essential (primary) hypertension: Secondary | ICD-10-CM | POA: Diagnosis not present

## 2017-06-09 HISTORY — PX: LEFT HEART CATH AND CORS/GRAFTS ANGIOGRAPHY: CATH118250

## 2017-06-09 SURGERY — LEFT HEART CATH AND CORS/GRAFTS ANGIOGRAPHY
Anesthesia: LOCAL

## 2017-06-09 MED ORDER — IOHEXOL 350 MG/ML SOLN
INTRAVENOUS | Status: DC | PRN
  Administered 2017-06-09: 100 mL via INTRA_ARTERIAL

## 2017-06-09 MED ORDER — IOPAMIDOL (ISOVUE-370) INJECTION 76%
INTRAVENOUS | Status: AC
  Filled 2017-06-09: qty 50

## 2017-06-09 MED ORDER — LIDOCAINE HCL (PF) 1 % IJ SOLN
INTRAMUSCULAR | Status: DC | PRN
  Administered 2017-06-09: 2 mL via INTRADERMAL

## 2017-06-09 MED ORDER — FENTANYL CITRATE (PF) 100 MCG/2ML IJ SOLN
INTRAMUSCULAR | Status: DC | PRN
  Administered 2017-06-09: 50 ug via INTRAVENOUS

## 2017-06-09 MED ORDER — ONDANSETRON HCL 4 MG/2ML IJ SOLN: 4.0000 mg | Freq: Four times a day (QID) | INTRAMUSCULAR | Status: DC | PRN

## 2017-06-09 MED ORDER — LIDOCAINE HCL 1 % IJ SOLN
INTRAMUSCULAR | Status: AC
  Filled 2017-06-09: qty 20

## 2017-06-09 MED ORDER — SODIUM CHLORIDE 0.9 % IV SOLN: INTRAVENOUS | Status: DC

## 2017-06-09 MED ORDER — OXYCODONE HCL 5 MG PO TABS: 5.0000 mg | ORAL_TABLET | ORAL | Status: DC | PRN

## 2017-06-09 MED ORDER — ACETAMINOPHEN 325 MG PO TABS: 650.0000 mg | ORAL_TABLET | ORAL | Status: DC | PRN

## 2017-06-09 MED ORDER — MIDAZOLAM HCL 2 MG/2ML IJ SOLN
INTRAMUSCULAR | Status: AC
Start: 2017-06-09 — End: ?
  Filled 2017-06-09: qty 2

## 2017-06-09 MED ORDER — MIDAZOLAM HCL 2 MG/2ML IJ SOLN
INTRAMUSCULAR | Status: DC | PRN
  Administered 2017-06-09 (×2): 1 mg via INTRAVENOUS

## 2017-06-09 MED ORDER — HEPARIN SODIUM (PORCINE) 1000 UNIT/ML IJ SOLN
INTRAMUSCULAR | Status: AC
  Filled 2017-06-09: qty 1

## 2017-06-09 MED ORDER — SODIUM CHLORIDE 0.9 % WEIGHT BASED INFUSION
3.0000 mL/kg/h | INTRAVENOUS | Status: DC
  Administered 2017-06-09: 3 mL/kg/h via INTRAVENOUS

## 2017-06-09 MED ORDER — SODIUM CHLORIDE 0.9% FLUSH: 3.0000 mL | INTRAVENOUS | Status: DC | PRN

## 2017-06-09 MED ORDER — HEPARIN SODIUM (PORCINE) 1000 UNIT/ML IJ SOLN
INTRAMUSCULAR | Status: DC | PRN
  Administered 2017-06-09: 5000 [IU] via INTRAVENOUS

## 2017-06-09 MED ORDER — ASPIRIN 81 MG PO CHEW: 81.0000 mg | CHEWABLE_TABLET | ORAL | Status: DC

## 2017-06-09 MED ORDER — VERAPAMIL HCL 2.5 MG/ML IV SOLN
INTRAVENOUS | Status: DC | PRN
  Administered 2017-06-09: 10 mL via INTRA_ARTERIAL

## 2017-06-09 MED ORDER — SODIUM CHLORIDE 0.9 % WEIGHT BASED INFUSION: 1.0000 mL/kg/h | INTRAVENOUS | Status: DC

## 2017-06-09 MED ORDER — SODIUM CHLORIDE 0.9 % IV SOLN: 250.0000 mL | INTRAVENOUS | Status: DC | PRN

## 2017-06-09 MED ORDER — HEPARIN (PORCINE) IN NACL 2-0.9 UNIT/ML-% IJ SOLN
INTRAMUSCULAR | Status: AC | PRN
  Administered 2017-06-09: 1000 mL via INTRA_ARTERIAL

## 2017-06-09 MED ORDER — VERAPAMIL HCL 2.5 MG/ML IV SOLN
INTRAVENOUS | Status: AC
  Filled 2017-06-09: qty 2

## 2017-06-09 MED ORDER — HEPARIN (PORCINE) IN NACL 2-0.9 UNIT/ML-% IJ SOLN
INTRAMUSCULAR | Status: AC
  Filled 2017-06-09: qty 1000

## 2017-06-09 MED ORDER — SODIUM CHLORIDE 0.9% FLUSH: 3.0000 mL | Freq: Two times a day (BID) | INTRAVENOUS | Status: DC

## 2017-06-09 MED ORDER — FENTANYL CITRATE (PF) 100 MCG/2ML IJ SOLN
INTRAMUSCULAR | Status: AC
  Filled 2017-06-09: qty 2

## 2017-06-09 MED ORDER — IOPAMIDOL (ISOVUE-370) INJECTION 76%
INTRAVENOUS | Status: DC | PRN
  Administered 2017-06-09: 95 mL via INTRA_ARTERIAL

## 2017-06-09 SURGICAL SUPPLY — 10 items
CATH INFINITI 5FR MPB2 (CATHETERS) ×2 IMPLANT
CATH INFINITI MULTIPACK ST 5F (CATHETERS) ×2 IMPLANT
COVER PRB 48X5XTLSCP FOLD TPE (BAG) ×1 IMPLANT
COVER PROBE 5X48 (BAG) ×1
GUIDEWIRE INQWIRE 1.5J.035X260 (WIRE) ×1 IMPLANT
INQWIRE 1.5J .035X260CM (WIRE) ×2
KIT HEART LEFT (KITS) ×2 IMPLANT
PACK CARDIAC CATHETERIZATION (CUSTOM PROCEDURE TRAY) ×2 IMPLANT
TRANSDUCER W/STOPCOCK (MISCELLANEOUS) ×2 IMPLANT
TUBING CIL FLEX 10 FLL-RA (TUBING) ×2 IMPLANT

## 2017-06-09 NOTE — Interval H&P Note (Signed)
Cath Lab Visit (complete for each Cath Lab visit)  Clinical Evaluation Leading to the Procedure:   ACS: No.  Non-ACS:    Anginal Classification: CCS II  Anti-ischemic medical therapy: Minimal Therapy (1 class of medications)  Non-Invasive Test Results: No non-invasive testing performed  Prior CABG: Previous CABG      History and Physical Interval Note:  06/09/2017 3:17 PM  [name]  has presented today for surgery, with the diagnosis of rca aneuyrism  The various methods of treatment have been discussed with the patient and family. After consideration of risks, benefits and other options for treatment, the patient has consented to  Procedure(s): LEFT HEART CATH AND CORS/GRAFTS ANGIOGRAPHY (N/A) as a surgical intervention .  The patient's history has been reviewed, patient examined, no change in status, stable for surgery.  I have reviewed the patient's chart and labs.  Questions were answered to the patient's satisfaction.     Belva Crome III

## 2017-06-09 NOTE — Discharge Instructions (Signed)

## 2017-06-10 ENCOUNTER — Telehealth: Payer: Self-pay | Admitting: *Deleted

## 2017-06-10 ENCOUNTER — Encounter (HOSPITAL_COMMUNITY): Payer: Self-pay | Admitting: Interventional Cardiology

## 2017-06-10 DIAGNOSIS — I2541 Coronary artery aneurysm: Secondary | ICD-10-CM

## 2017-06-10 MED FILL — Heparin Sodium (Porcine) 2 Unit/ML in Sodium Chloride 0.9%: INTRAMUSCULAR | Qty: 1000 | Status: AC

## 2017-06-10 MED FILL — Lidocaine HCl Local Inj 1%: INTRAMUSCULAR | Qty: 20 | Status: AC

## 2017-06-10 NOTE — Telephone Encounter (Signed)
-----   Message from Belva Crome, MD sent at 06/09/2017  6:33 PM EST ----- Please refer to Dr. Gaye Pollack for surgical opinion concerning coronary aneurysm.  HS

## 2017-06-13 ENCOUNTER — Other Ambulatory Visit: Payer: Self-pay | Admitting: *Deleted

## 2017-06-15 ENCOUNTER — Encounter: Payer: Self-pay | Admitting: Surgery

## 2017-06-15 ENCOUNTER — Institutional Professional Consult (permissible substitution) (INDEPENDENT_AMBULATORY_CARE_PROVIDER_SITE_OTHER): Payer: 59 | Admitting: Surgery

## 2017-06-15 VITALS — BP 117/70 | HR 67 | Resp 20 | Ht 71.0 in | Wt 255.0 lb

## 2017-06-15 DIAGNOSIS — Z951 Presence of aortocoronary bypass graft: Secondary | ICD-10-CM

## 2017-06-15 DIAGNOSIS — I2541 Coronary artery aneurysm: Secondary | ICD-10-CM | POA: Diagnosis not present

## 2017-06-15 NOTE — Progress Notes (Signed)
Cardiothoracic Surgery Consultation  PCP is Lajean Manes, MD Referring Provider is Belva Crome, MD  Chief Complaint  Patient presents with  . 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  He also has a history of an abdominal aortic aneurysm that was treated with endovascular aneurysm repair in 2015 by Dr. Oneida Alar.  The patient subsequently developed an endoleak which was treated percutaneously by IR.  In the workup of this patient had a CTA of the abdomen and pelvis which showed an enlarging mid right coronary artery aneurysm.  He had a dedicated CTA of the chest on 05/12/2017 which showed the maximum diameter of this and 4.6 x 4.0 cm compared to 2.8 x 2.4 cm on his abdominal and pelvic CT done in November 2014.  Patient then underwent cardiac catheterization on 06/09/2017 which showed a large aneurysm in the mid right coronary artery.  The contrast channel within the aneurysm 20 mm compared to the total aneurysm diameter of 4.6 cm noted on CT.  The right coronary artery proximal to the aneurysm appeared to have a tight stenosis just before the aneurysm.  A more distal right coronary artery was completely occluded beyond the aneurysm with filling of the distal vessel by a patent saphenous vein graft.  There is also aneurysmal change of the distal left main coronary artery at about 20 mm.  There was a moderate size ramus branch with a 60% proximal stenosis.  There is total occlusion of the native left circumflex with the distal vessel filling faintly by collaterals.  The LAD had 80% proximal stenosis with competitive flow due to the patent LIMA graft.  Left ventricular ejection fraction was estimated at 55%.  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  The patient denies any chest pain or pressure.  He does not exercise regularly but gets out and walks without any problems.  Feels like his stamina is good.  He denies any shortness of breath. Past Medical History:  Diagnosis Date  . AKI (acute kidney injury) ([place])   . Chest pain   . Coronary artery disease   . Deafness in right ear   . Depression   . Dysrhythmia   . 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 History of kidney stones   . Hx of CABG   . Hypertension   . Sleep apnea    no longer is an issue  . Urinary frequency     Past Surgical History:  Procedure Laterality Date  . ABDOMINAL AORTIC ENDOVASCULAR STENT GRAFT N/A 04/30/2014   Procedure: ABDOMINAL AORTIC ENDOVASCULAR STENT GRAFT;  Surgeon: Elam Dutch, MD;  Location: [facility] OR;  Service: Vascular;  Laterality: N/A;  . ABDOMINAL AORTIC ENDOVASCULAR STENT GRAFT N/A 05/26/2015   Procedure: ABDOMINAL AORTIC ENDOVASCULAR STENT GRAFT RE-INTERVENTION; REPAIR OF PROXIMAL CUFF;  Surgeon: Elam Dutch, MD;  Location: [place];  Service: Vascular;  Laterality: N/A;  . BACK SURGERY  2009   lower back  . CARDIAC CATHETERIZATION    . COLONOSCOPY    . CORONARY ARTERY BYPASS GRAFT  2001  . CYSTOSCOPY WITH RETROGRADE PYELOGRAM, URETEROSCOPY AND STENT PLACEMENT Bilateral 02/07/2014   Procedure: CYSTOSCOPY WITH BILATERAL RETROGRADE  PYELOGRAM, BILATERAL URETEROSCOPY, RIGHT EXTRACTION OF STONES AND RIGHT STENT PLACEMENT;  Surgeon: Jorja Loa, MD;  Location: [facility];  Service: Urology;  Laterality: Bilateral;  . ESOPHAGOGASTRODUODENOSCOPY    . IR AORTAGRAM ABDOMINAL SERIALOGRAM  05/20/2017  . IR CT SPINE LTD  05/20/2017  . IR EMBO ARTERIAL NOT HEMORR HEMANG INC GUIDE ROADMAPPING  05/20/2017  . IR GENERIC HISTORICAL  11/06/2015   IR RADIOLOGIST EVAL & MGMT 11/06/2015 Jacqulynn Cadet, MD [facility] INTERV RAD  . IR GENERIC HISTORICAL  04/20/2016   IR RADIOLOGIST EVAL & MGMT  04/20/2016 Jacqulynn Cadet, MD [facility] INTER[facility] RAD  . IR RADIOLOGIST EVAL & MGMT  05/11/2017  . LAPAROSCOPIC GASTRIC BYPASS  2010   [facility]  . LAPAROSCOPIC LYSIS OF ADHESIONS N/A 03/06/2013   Procedure: LAPAROSCOPIC LYSIS OF ADHESIONS AND CLOSURE OF INTERNAL HERNIAS x2;  Surgeon: Adin Hector, MD;  Location: [facility];  Service: General;  Laterality: N/A;  . LAPAROSCOPY N/A 03/06/2013   Procedure: LAPAROSCOPY DIAGNOSTIC;  Surgeon: Adin Hector, MD;  Location: [facility];  Service: General;  Laterality: N/A;  . LEFT HEART CATH AND CORS/GRAFTS ANGIOGRAPHY N/A 06/09/2017   Procedure: LEFT HEART CATH AND CORS/GRAFTS ANGIOGRAPHY;  Surgeon: Belva Crome, MD;  Location: [place] [facility];  Service: Cardiovascular;  Laterality: N/A;  . PERIPHERAL VASCULAR CATHETERIZATION Right 04/30/2014   Procedure: EMBOLIZATION right internal iliac;  Surgeon: Elam Dutch, MD;  Location: [place];  Service: Vascular;  Laterality: Right;  . RADIOLOGY WITH ANESTHESIA N/A 05/20/2017   Procedure: Endoleak Repair;  Surgeon: Jacqulynn Cadet, MD;  Location: [place];  Service: Radiology;  Laterality: N/A;    Family History  Problem Relation Age of Onset  . Lung cancer Mother   . Cancer Mother        Lung  . AAA (abdominal aortic aneurysm) Father   . Heart disease Father        before age 58  . Heart disease Brother        before age 56    Social History Social History   Tobacco Use  . Smoking status: Never Smoker  . Smokeless tobacco: Never Used  Substance Use Topics  . Alcohol use: Yes    Comment: occasional  . Drug use: No    Current Outpatient Medications  Medication Sig Dispense Refill  . amLODipine (NORVASC) 10 MG tablet Take 10 mg by mouth daily.    . ARIPiprazole (ABILIFY) 2 MG tablet Take 2 mg by mouth daily.    Marland Kitchen aspirin 81 MG tablet Take 81 mg by mouth every morning.     Marland Kitchen buPROPion (WELLBUTRIN XL) 300 MG 24 hr tablet Take 300 mg by mouth daily.    . carvedilol (COREG) 25 MG tablet Take 1  tablet (25 mg total) by mouth 2 (two) times daily with a meal. 60 tablet 0  . lisinopril (PRINIVIL,ZESTRIL) 40 MG tablet Take 1 tablet (40 mg total) every morning by mouth. 90 tablet 2  . naltrexone (DEPADE) 50 MG tablet Take 50 mg by mouth daily.    . rosuvastatin (CRESTOR) 40 MG tablet Take 1 tablet (40 mg total) by mouth daily. 90 tablet 3   No current facility-administered medications for this visit.     No Known Allergies  Review of Systems  Constitutional: Negative.  Negative for fatigue.  HENT: Positive for hearing loss.   Eyes: Negative.   Respiratory: Negative for shortness of breath.   Cardiovascular: Negative for chest pain, palpitations and leg  swelling.  Gastrointestinal: Negative.   Endocrine: Negative.   Genitourinary: Negative.   Musculoskeletal: Negative.   Allergic/Immunologic: Negative.   Neurological: Negative.   Hematological: Negative.   Psychiatric/Behavioral: Negative.     BP 117/70   Pulse 67   Resp 20   Ht 5\' 11"  (1.803 m)   Wt 255 lb (115.7 kg)   SpO2 96%   BMI 35.57 kg/m  Physical Exam  Constitutional: He is oriented to person, place, and time. He appears well-developed and well-nourished. He appears distressed.  HENT:  Head: Normocephalic and atraumatic.  Mouth/Throat: Oropharynx is clear and moist.  Eyes: EOM are normal. Pupils are equal, round, and reactive to light.  Neck: Normal range of motion. Neck supple. No thyromegaly present.  Cardiovascular: Normal rate, regular rhythm, normal heart sounds and intact distal pulses.  No murmur heard. Pulmonary/Chest: Effort normal and breath sounds normal. No respiratory distress.  Abdominal: Soft. Bowel sounds are normal. He exhibits no distension. There is no tenderness.  Musculoskeletal: Normal range of motion. He exhibits no edema.  Lymphadenopathy:    He has no cervical adenopathy.  Neurological: He is alert and oriented to person, place, and time.  Skin: Skin is warm and dry.  Psychiatric:  He has a normal mood and affect.     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  EXAM: CT ANGIOGRAPHY CHEST WITH CONTRAST  TECHNIQUE: Multidetector CT imaging of the chest was performed using the standard protocol during bolus administration of intravenous contrast. Multiplanar CT image reconstructions and MIPs were obtained to evaluate the vascular anatomy.  CONTRAST:  100 mL Isovue 370  COMPARISON:  Multiple prior CT scans of the abdomen and pelvis, most recently 04/25/2017, more remotely 03/05/2013  FINDINGS: Cardiovascular: Slow interval enlargement of a fusiform aneurysmal dilatation of the native right coronary artery. The maximal diameter of the aneurysm measures 4.6 x 4.0 cm compared to 2.8 x 2.4 cm in November of 2014. Patient is status post multivessel CABG with aortic to diagonal and aortic to posterior descending saphenous venous bypass grafts. Atherosclerotic calcifications are present throughout the native coronary arteries. The aorta is normal in caliber. Conventional 3 vessel arch anatomy. The pulmonary arteries are normal in size. Mild cardiomegaly. No pericardial effusion.  Mediastinum/Nodes: Unremarkable CT appearance of the thyroid gland. No suspicious mediastinal or hilar adenopathy. No soft tissue mediastinal mass. The thoracic esophagus is unremarkable.  Lungs/Pleura: Lungs are clear. No pleural effusion or pneumothorax.  Upper Abdomen: Surgical changes of prior gastric bypass surgery. No acute abnormality in the upper abdomen.  Musculoskeletal: No acute fracture or aggressive appearing lytic or blastic osseous lesion. Healed median sternotomy.  Review of the MIP images confirms the above findings.  IMPRESSION: 1. Enlarging right native coronary artery aneurysm now measuring up to 4.6 cm in greatest diameter  compared to 2.8 cm in November of 2014. 2. Coronary artery calcifications. 3. Patent 2 vessel CABG. 4. Mild cardiomegaly.  Signed,  Criselda Peaches, MD  [facility]  [facility]      Panel Physicians Referring Physician Case Authorizing Physician  Belva Crome, MD (Primary)    Procedures   LEFT HEART CATH AND CORS/GRAFTS ANGIOGRAPHY  Conclusion    Severe aneurysmal coronary disease with a massive native right coronary aneurysm in the mid segment.  There is also a large distal left main aneurysm.  RCA aneurysm measures greater  than 20 mm in diameter in the left main aneurysm less than 20 mm.  Total occlusion of the mid to distal RCA.  Distal vessel fills by SVG sequential graft.  Total occlusion of the native circumflex.  Distal vessel fills by collaterals.  80% proximal tubular LAD stenosis with competitive flow due to patent LIMA.  Segmental 60% proximal ramus obstruction  Patent sequential saphenous vein graft to the PDA.  Patent LIMA to the LAD.  No other saphenous vein grafts could be identified.  SVG to diagonal is felt to be occluded.  Left ventricular function is normal.  Hemodynamics are normal.  EF 55%.  RECOMMENDATIONS:   Heart team approach.  Consider endovascular therapy with Graftmaster versus surgical ligation and consideration of repeat coronary bypass.  Indications   Coronary artery aneurysm [I25.41 (ICD-10-CM)]  Coronary artery disease involving coronary bypass graft of native heart without angina pectoris [I25.810 (ICD-10-CM)]  Procedural Details/Technique   Technical Details The left radial area was sterilely prepped and draped. Intravenous sedation with Versed and fentanyl was administered. 1% Xylocaine was infiltrated to achieve local analgesia. Using real-time vascular ultrasound, a double wall stick with an angiocath was utilized to obtain intra-arterial access. A VUS image was  saved for the record.The modified Seldinger technique was used to place a 89F " Slender" sheath in the right radial artery. Weight based heparin was administered. Coronary angiography was done using 5 F catheters. Right coronary angiography was performed with a JR4 and MP B2. Left ventricular hemodynamic recordings and angiography was done using the JR 4 catheter and hand injection. Left coronary angiography was performed with a JL 3.5 cm. The B2 multipurpose catheter was used for native right coronary angiography as well as right saphenous vein graft and JL. A 5 French internal mammary catheter was used for left IMA angiography. No surgical note is available to outline saphenous vein graft sites and distribution. The RCA SVG is sequential to the PDA and continuation of the right coronary. Suspect there is an occluded SVG to diagonal which we were unable to demonstrate. Vein grafts were not anatomically marked on the aorta. LIMA to the LAD is widely patent.  The study demonstrated aneurysmal native coronary disease with a greater than 20 mm diameter mid right coronary aneurysm and surrounding calcification suggesting even larger diameter filled with thrombus. The distal left main is also aneurysmal and less than 20 mm in diameter.  Hemostasis was achieved using a pneumatic band.  During this procedure the patient is administered a total of Versed 2 mg and Fentanyl 50 mg to achieve and maintain moderate conscious sedation. The patient's heart rate, blood pressure, and oxygen saturation are monitored continuously during the procedure. The period of conscious sedation is 47 minutes, of which I was present face-to-face 100% of this time.   Estimated blood loss <50 mL.  During this procedure the patient was administered the following to achieve and maintain moderate conscious sedation: Versed 2 mg, Fentanyl 50 mcg, while the patient's heart rate, blood pressure, and oxygen saturation were continuously monitored.  The period of conscious sedation was 47 minutes, of which I was present face-to-face 100% of this time.  Coronary Findings   Diagnostic  Dominance: Right  Left Main  Ost LM to Dist LM lesion 0% stenosed  Non-stenotic Ost LM to Dist LM lesion.  Left Anterior Descending  Ost LAD to Prox LAD lesion 80% stenosed  Ost LAD to Prox LAD lesion is 80% stenosed.  Ramus Intermedius  Ost Ramus to  Ramus lesion 60% stenosed  Ost Ramus to Ramus lesion is 60% stenosed.  Left Circumflex  Collaterals  Dist Cx filled by collaterals from Post Atrio.    Ost Cx to Prox Cx lesion 100% stenosed  Ost Cx to Prox Cx lesion is 100% stenosed.  Right Coronary Artery  Prox RCA lesion 95% stenosed  Prox RCA lesion is 95% stenosed.  Prox RCA to Mid RCA lesion 0% stenosed  Non-stenotic Prox RCA to Mid RCA lesion.  Dist RCA lesion 100% stenosed  Dist RCA lesion is 100% stenosed.  Acute Marginal Branch  Vessel is small in size.  LIMA Graft to Mid LAD  Sequential Graft to Post Atrio, RPDA  Dist Graft to Insertion lesion before Post Atrio 60% stenosed  Dist Graft to Insertion lesion before Post Atrio is 60% stenosed.  Graft to 1st Diag  Origin lesion 100% stenosed  Origin lesion is 100% stenosed.  Intervention   No interventions have been documented.  Wall Motion   Resting       All segments of the heart are normal.          Left Heart   Left Ventricle The left ventricular size is normal. The left ventricular systolic function is normal. LV end diastolic pressure is normal. The left ventricular ejection fraction is 50-55% by visual estimate.  Coronary Diagrams   Diagnostic Diagram       Implants     No implant documentation for this case.  MERGE Images   Show images for CARDIAC CATHETERIZATION   Link to Procedure Log   Procedure Log    Hemo Data    Most Recent Value  AO Systolic Pressure 314 mmHg  AO Diastolic Pressure 79 mmHg  AO Mean 970 mmHg  LV Systolic Pressure 263 mmHg  LV  Diastolic Pressure 4 mmHg  LV EDP 11 mmHg  Arterial Occlusion Pressure Extended Systolic Pressure 785 mmHg  Arterial Occlusion Pressure Extended Diastolic Pressure 77 mmHg  Arterial Occlusion Pressure Extended Mean Pressure 100 mmHg  Left Ventricular Apex Extended Systolic Pressure 885 mmHg  Left Ventricular Apex Extended Diastolic Pressure 2 mmHg  Left Ventricular Apex Extended EDP Pressure 8 mmHg    Impression:  This 57 year old gentleman has a history of coronary artery bypass graft surgery in 2001 at [facility] with an excellent long-term result.  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  The contrast channel within the aneurysm is only about 20 mm with the remainder of the aneurysm sac filled with laminated thrombus.  There is a fairly tight stenosis just proximal to the aneurysm and the right coronary artery is occluded just beyond it with the distal right coronary artery filling by a patent saphenous vein graft.  There is no danger of ischemia due to thrombosis or emboli from this aneurysm since the right coronary artery is occluded just beyond the aneurysm.  I am surprised that the aneurysm has grown over the past few years since the wall is fairly diffusely calcified as is the remainder of the right coronary artery.  The distal right coronary artery is diffusely diseased with calcific plaque but appears well perfused by the patent saphenous vein graft.  I think the best option for treating the right coronary aneurysm is occlusion of the inflow with a coil or plug.  This would allow thrombosis of the aneurysm.  I do not think open surgical treatment for this aneurysm is indicated and would be high risk due to his previous coronary bypass surgery and the calcified nature of his blood vessels including the aneurysm sac.  He also has a smaller aneurysm of the  distal left main coronary artery that is about 20 mm by catheterization but does not appear that impressive and only supplies the ramus branch since the left circumflex is occluded at its ostium and the LAD has a high-grade proximal stenosis and is supplied by a patent LIMA graft.  The left main is not surgically accessible for ligation of an aneurysm.  There is no indication for redo coronary artery bypass graft surgery in this patient at this time and I do not think his vessels would be suitable anyway since he has diffuse calcific plaque that can be seen outlining his vessels into the distal segments.  I reviewed the catheterization films and CT scan images with him and answered all of his questions.  Plan:  He will continue to follow-up with Dr. Tamala Julian concerning nonsurgical treatment of this problem.   I spent 60 minutes performing this consultation and > 50% of this time was spent face to face counseling and coordinating the care of this patient's coronary artery aneurysms.   Gaye Pollack, MD [facility] [phone]

## 2017-06-20 ENCOUNTER — Encounter: Payer: Self-pay | Admitting: Physician Assistant

## 2017-06-20 NOTE — Progress Notes (Deleted)
Cardiology Office Note    Date:  06/20/2017  ID:  Daniel Blevins, DOB 1961/04/16, MRN [id number] PCP:  Lajean Manes, MD  Cardiologist:  Dr. Tamala Julian   Chief Complaint: post-cath visit  History of Present Illness:  Daniel Blevins is a 57 y.o. male with history of CAD s/p CABG 2001 at [facility], coronary aneurysm, AAA s/p EVAR 2015 with repeat procedures for endoleak, multiple vascular aneurysms, deafness in right ear, gastric bypass, HTN, pancytopenia during admission 10/2016 of unclear cause (?r/t B12 def from bypass), CKD II who presents for post-cath follow-up.  Last echo in 10/2016 showed moderate LVH, EF 60-65%, aortic root dimension 37mm and ascending aortic diabeter 62mm, dilated IVC. Earlier this month he saw Dr. Tamala Julian for follow-up. He had no new cardiac symptoms. However, in the course of management for abdominal aortic aneurysm, CT scans have been performed and recently documented significant growth and native right coronary aneurysm/diameter from 2.5 cm to 4.2 cm. He has had recent treatment with IR for his endoleak. Imaging has also shown bilateral popliteal artery aneurysms as well as aneurysm in the superior gluteal artery and left internal iliac artery. For his evolving coronary aneurysm, Dr. Tamala Julian recommended cardiac cath which was performed 06/09/17 with results below:   Severe aneurysmal coronary disease with a massive native right coronary aneurysm in the mid segment.  There is also a large distal left main aneurysm.  RCA aneurysm measures greater than 20 mm in diameter in the left main aneurysm less than 20 mm.  Total occlusion of the mid to distal RCA.  Distal vessel fills by SVG sequential graft.  Total occlusion of the native circumflex.  Distal vessel fills by collaterals.  80% proximal tubular LAD stenosis with competitive flow due to patent LIMA.  Segmental 60% proximal ramus obstruction  Patent sequential saphenous vein graft to the PDA.  Patent LIMA to the  LAD.  No other saphenous vein grafts could be identified.  SVG to diagonal is felt to be occluded.  Left ventricular function is normal.  Hemodynamics are normal.  EF 55%.  Dr. Tamala Julian recommended consideration of endovascular therapy with Graftmaster versus surgical ligation and consideration of repeat coronary bypass. The patient was referred to Dr. Cyndia Bent for consideration of such. His consult note stated, "He has developed a giant coronary artery aneurysm of the mid right coronary artery which is currently measured at 4.6 cm on CTA of the chest compared to 2.8 cm in 2014.  The contrast channel within the aneurysm is only about 20 mm with the remainder of the aneurysm sac filled with laminated thrombus.  There is a fairly tight stenosis just proximal to the aneurysm and the right coronary artery is occluded just beyond it with the distal right coronary artery filling by a patent saphenous vein graft.  There is no danger of ischemia due to thrombosis or emboli from this aneurysm since the right coronary artery is occluded just beyond the aneurysm.  I am surprised that the aneurysm has grown over the past few years since the wall is fairly diffusely calcified as is the remainder of the right coronary artery.  The distal right coronary artery is diffusely diseased with calcific plaque but appears well perfused by the patent saphenous vein graft.  I think the best option for treating the right coronary aneurysm is occlusion of the inflow with a coil or plug.  This would allow thrombosis of the aneurysm.  I do not think open surgical treatment for this aneurysm is  indicated and would be high risk due to his previous coronary bypass surgery and the calcified nature of his blood vessels including the aneurysm sac.  He also has a smaller aneurysm of the distal left main coronary artery that is about 20 mm by catheterization but does not appear that impressive and only supplies the ramus branch since the left  circumflex is occluded at its ostium and the LAD has a high-grade proximal stenosis and is supplied by a patent LIMA graft.  The left main is not surgically accessible for ligation of an aneurysm.  There is no indication for redo coronary artery bypass graft surgery in this patient at this time and I do not think his vessels would be suitable anyway since he has diffuse calcific plaque that can be seen outlining his vessels into the distal segments." He recommended the patient follow up  with Dr. Tamala Julian concerning nonsurgical treatment of this problem.  Review of chart otherwise shows last labwork with Hgb 11.8 and Cr 1.22 (06/03/17), LDL 116 (10/2016), LFTs ok 06/2015 except albumin 3.4.  Coronary artery aneurysm CAD HTN Hyperlipidemia    Past Medical History:  Diagnosis Date  . AAA (abdominal aortic aneurysm) ([place])    a. s/p EVAR 2015 with endoleak treated by IR.  Marland Kitchen AKI (acute kidney injury) ([place])   . Chest pain   . CKD (chronic kidney disease), stage II   . Coronary artery disease    a. CABG 2001 at [facility]. b. Cath 06/2017 to evaluate coronary aneurysm, workup in progress as of 06/2017.  Marland Kitchen Deafness in right ear   . Depression   . Dysrhythmia   . 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 History of kidney stones   . Hx of CABG   . Hyperlipidemia   . Hypertension   . Sleep apnea    no longer is an issue  . Urinary frequency   . Vascular anomaly    a. 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    Past Surgical History:  Procedure Laterality Date  . ABDOMINAL AORTIC ENDOVASCULAR STENT GRAFT N/A 04/30/2014   Procedure: ABDOMINAL AORTIC ENDOVASCULAR STENT GRAFT;  Surgeon: Elam Dutch, MD;  Location[facility] OR;  Service: Vascular;  Laterality: N/A;  . ABDOMINAL AORTIC ENDOVASCULAR STENT GRAFT N/A 05/26/2015   Procedure: ABDOMINAL AORTIC ENDOVASCULAR STENT  GRAFT RE-INTERVENTION; REPAIR OF PROXIMAL CUFF;  Surgeon: Elam Dutch, MD;  Location: [place];  Service: Vascular;  Laterality: N/A;  . BACK SURGERY  2009   lower back  . CARDIAC CATHETERIZATION    . COLONOSCOPY    . CORONARY ARTERY BYPASS GRAFT  2001  . CYSTOSCOPY WITH RETROGRADE PYELOGRAM, URETEROSCOPY AND STENT PLACEMENT Bilateral 02/07/2014   Procedure: CYSTOSCOPY WITH BILATERAL RETROGRADE PYELOGRAM, BILATERAL URETEROSCOPY, RIGHT EXTRACTION OF STONES AND RIGHT STENT PLACEMENT;  Surgeon: Jorja Loa, MD;  Location: [facility];  Service: Urology;  Laterality: Bilateral;  . ESOPHAGOGASTRODUODENOSCOPY    . IR AORTAGRAM ABDOMINAL SERIALOGRAM  05/20/2017  . IR CT SPINE LTD  05/20/2017  . IR EMBO ARTERIAL NOT HEMORR HEMANG INC GUIDE ROADMAPPING  05/20/2017  . IR GENERIC HISTORICAL  11/06/2015   IR RADIOLOGIST EVAL & MGMT 11/06/2015 Jacqulynn Cadet, MD [facility] INTERV RAD  . IR GENERIC HISTORICAL  04/20/2016   IR RADIOLOGIST EVAL & MGMT 04/20/2016 Jacqulynn Cadet, MD [facility] INTERV RAD  . IR RADIOLOGIST EVAL &  MGMT  05/11/2017  . LAPAROSCOPIC GASTRIC BYPASS  2010   [facility]  . LAPAROSCOPIC LYSIS OF ADHESIONS N/A 03/06/2013   Procedure: LAPAROSCOPIC LYSIS OF ADHESIONS AND CLOSURE OF INTERNAL HERNIAS x2;  Surgeon: Adin Hector, MD;  Location: [facility];  Service: General;  Laterality: N/A;  . LAPAROSCOPY N/A 03/06/2013   Procedure: LAPAROSCOPY DIAGNOSTIC;  Surgeon: Adin Hector, MD;  Location: [facility];  Service: General;  Laterality: N/A;  . LEFT HEART CATH AND CORS/GRAFTS ANGIOGRAPHY N/A 06/09/2017   Procedure: LEFT HEART CATH AND CORS/GRAFTS ANGIOGRAPHY;  Surgeon: Belva Crome, MD;  Location: [place] [facility];  Service: Cardiovascular;  Laterality: N/A;  . PERIPHERAL VASCULAR CATHETERIZATION Right 04/30/2014   Procedure: EMBOLIZATION right internal iliac;  Surgeon: Elam Dutch, MD;  Location: [place];  Service: Vascular;  Laterality: Right;  . RADIOLOGY WITH ANESTHESIA N/A 05/20/2017    Procedure: Endoleak Repair;  Surgeon: Jacqulynn Cadet, MD;  Location: [place];  Service: Radiology;  Laterality: N/A;    Current Medications: No outpatient medications have been marked as taking for the 06/23/17 encounter (Appointment) with Charlie Pitter, PA-C.   ***   Allergies:   Patient has no known allergies.   Social History   Socioeconomic History  . Marital status: Married    Spouse name: Not on file  . Number of children: Not on file  . Years of education: Not on file  . Highest education level: Not on file  Social Needs  . Financial resource strain: Not on file  . Food insecurity - worry: Not on file  . Food insecurity - inability: Not on file  . Transportation needs - medical: Not on file  . Transportation needs - non-medical: Not on file  Occupational History  . Not on file  Tobacco Use  . Smoking status: Never Smoker  . Smokeless tobacco: Never Used  Substance and Sexual Activity  . Alcohol use: Yes    Comment: occasional  . Drug use: No  . Sexual activity: No  Other Topics Concern  . Not on file  Social History Narrative  . Not on file     Family History:  Family History  Problem Relation Age of Onset  . Lung cancer Mother   . Cancer Mother        Lung  . AAA (abdominal aortic aneurysm) Father   . Heart disease Father        before age 80  . Heart disease Brother        before age 68   ***  ROS:   Please see the history of present illness. Otherwise, review of systems is positive for ***.  All other systems are reviewed and otherwise negative.    PHYSICAL EXAM:   VS:  There were no vitals taken for this visit.  BMI: There is no height or weight on file to calculate BMI. GEN: Well nourished, well developed, in no acute distress  HEENT: normocephalic, atraumatic Neck: no JVD, carotid bruits, or masses Cardiac: ***RRR; no murmurs, rubs, or gallops, no edema  Respiratory:  clear to auscultation bilaterally, normal work of breathing GI: soft,  nontender, nondistended, + BS MS: no deformity or atrophy  Skin: warm and dry, no rash Neuro:  Alert and Oriented x 3, Strength and sensation are intact, follows commands Psych: euthymic mood, full affect  Wt Readings from Last 3 Encounters:  06/15/17 255 lb (115.7 kg)  06/09/17 255 lb (115.7 kg)  06/03/17 258 lb 6.4  oz (117.2 kg)      Studies/Labs Reviewed:   EKG:  EKG was ordered today and personally reviewed by me and demonstrates *** EKG was not ordered today.***  Recent Labs: 06/03/2017: BUN 19; Creatinine, Ser 1.22; Hemoglobin 11.8; Platelets 187; Potassium 4.2; Sodium 142   Lipid Panel    Component Value Date/Time   CHOL 204 (H) 11/25/2016 1349   TRIG 68 11/25/2016 1349   HDL 74 11/25/2016 1349   CHOLHDL 2.8 11/25/2016 1349   VLDL 14 11/25/2016 1349   LDLCALC 116 (H) 11/25/2016 1349    Additional studies/ records that were reviewed today include: Summarized above.***    ASSESSMENT & PLAN:   1. ***  Disposition: F/u with ***   Medication Adjustments/Labs and Tests Ordered: Current medicines are reviewed at length with the patient today.  Concerns regarding medicines are outlined above. Medication changes, Labs and Tests ordered today are summarized above and listed in the Patient Instructions accessible in Encounters.   Signed, Charlie Pitter, PA-C  06/20/2017 4:30 PM    [facility] [address] [place], [place]  [address] Phone: [phone]; Fax: [phone]

## 2017-06-22 ENCOUNTER — Ambulatory Visit (INDEPENDENT_AMBULATORY_CARE_PROVIDER_SITE_OTHER): Payer: 59 | Admitting: Interventional Cardiology

## 2017-06-22 ENCOUNTER — Ambulatory Visit
Admission: RE | Admit: 2017-06-22 | Discharge: 2017-06-22 | Disposition: A | Discharge status: Home / Self Care | Payer: 59 | Source: Ambulatory Visit | Attending: Student | Admitting: Student

## 2017-06-22 ENCOUNTER — Encounter: Payer: Self-pay | Admitting: *Deleted

## 2017-06-22 VITALS — BP 124/86 | HR 66 | Ht 71.0 in | Wt 258.2 lb

## 2017-06-22 DIAGNOSIS — I2541 Coronary artery aneurysm: Secondary | ICD-10-CM | POA: Diagnosis not present

## 2017-06-22 DIAGNOSIS — I25700 Atherosclerosis of coronary artery bypass graft(s), unspecified, with unstable angina pectoris: Secondary | ICD-10-CM

## 2017-06-22 DIAGNOSIS — T82330S Leakage of aortic (bifurcation) graft (replacement), sequela: Secondary | ICD-10-CM

## 2017-06-22 DIAGNOSIS — T82330A Leakage of aortic (bifurcation) graft (replacement), initial encounter: Secondary | ICD-10-CM

## 2017-06-22 DIAGNOSIS — I10 Essential (primary) hypertension: Secondary | ICD-10-CM | POA: Diagnosis not present

## 2017-06-22 DIAGNOSIS — IMO0001 Reserved for inherently not codable concepts without codable children: Secondary | ICD-10-CM

## 2017-06-22 HISTORY — PX: IR RADIOLOGIST EVAL & MGMT: IMG5224

## 2017-06-22 NOTE — H&P (View-Only) (Signed)
Cardiology Office Note    Date:  06/22/2017   ID:  Sergei Delo, DOB 30-Dec-1960, MRN [id number]  PCP:  Lajean Manes, MD  Cardiologist: Sinclair Grooms, MD   Chief Complaint  Patient presents with  . Coronary Artery Disease    History of Present Illness:  Daniel Blevins is a 57 y.o. male with history of diffuse vascular disease including CABG in 2001 at [facility] receiving LIMA to LAD, SVG to RCA, and SVG to diagonal.  History of abdominal aortic aneurysm status post EVAR for abdominal aortic aneurysm.  Recent cath was performed after CT scan demonstrated a growing native right coronary aneurysm, estimated to be greater than 4 cm in diameter.  Angiography demonstrated total occlusion of the mid native right coronary small distal RV branch at the total occlusion and high-grade obstruction proximal to the aneurysm.  He is back today to discuss management.  He saw Dr. Pamalee Leyden for surgical consideration.  Felt that the risk was too high given prior bypass surgery and other comorbidities.  Felt appropriate management strategy should be endovascular either coiling or stent graft.  Discussed the approach with the patient in which we would consider PTCA and angioplasty with Graft Master covered stent to exclude the aneurysm from hemodynamic forces and therefore hopefully prevent future growth.  Discussed in detail with the patient the limited database on this management strategy.  Absence of personal experience with this type entity.  Wide discussion with colleagues including both vascular surgery and interventional radiology.    Past Medical History:  Diagnosis Date  . AAA (abdominal aortic aneurysm) ([place])    a. s/p EVAR 2015 with endoleak treated by IR.  Marland Kitchen AKI (acute kidney injury) ([place])   . Chest pain   . CKD (chronic kidney disease), stage II   . Coronary artery disease    a. CABG 2001 at [facility]. b. Cath 06/2017 to evaluate coronary aneurysm, workup in progress as of 06/2017.  Marland Kitchen  Deafness in right ear   . Depression   . Dysrhythmia   . 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 History of kidney stones   . Hx of CABG   . Hyperlipidemia   . Hypertension   . Sleep apnea    no longer is an issue  . Urinary frequency   . Vascular anomaly    a. 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    Past Surgical History:  Procedure Laterality Date  . ABDOMINAL AORTIC ENDOVASCULAR STENT GRAFT N/A 04/30/2014   Procedure: ABDOMINAL AORTIC ENDOVASCULAR STENT GRAFT;  Surgeon: Elam Dutch, MD;  [facility];  Service: Vascular;  Laterality: N/A;  . ABDOMINAL AORTIC ENDOVASCULAR STENT GRAFT N/A 05/26/2015   Procedure: ABDOMINAL AORTIC ENDOVASCULAR STENT GRAFT RE-INTERVENTION; REPAIR OF PROXIMAL CUFF;  Surgeon: Elam Dutch, MD;  Location: [place];  Service: Vascular;  Laterality: N/A;  . BACK SURGERY  2009   lower back  . CARDIAC CATHETERIZATION    . COLONOSCOPY    . CORONARY ARTERY BYPASS GRAFT  2001  . CYSTOSCOPY WITH RETROGRADE PYELOGRAM, URETEROSCOPY AND STENT PLACEMENT Bilateral 02/07/2014   Procedure: CYSTOSCOPY WITH BILATERAL RETROGRADE PYELOGRAM, BILATERAL URETEROSCOPY, RIGHT EXTRACTION OF STONES AND RIGHT STENT PLACEMENT;  Surgeon: Jorja Loa, MD;  [facility];  Service: Urology;  Laterality: Bilateral;  . ESOPHAGOGASTRODUODENOSCOPY    . IR [place]  05/20/2017  . IR CT SPINE LTD  05/20/2017  . IR EMBO ARTERIAL NOT HEMORR HEMANG INC GUIDE ROADMAPPING  05/20/2017  . IR GENERIC HISTORICAL  11/06/2015   IR RADIOLOGIST EVAL & MGMT 11/06/2015 Jacqulynn Cadet, MD [facility] INTERV RAD  . IR GENERIC HISTORICAL  04/20/2016   IR RADIOLOGIST EVAL & MGMT 04/20/2016 Jacqulynn Cadet, MD [facility] INTERV RAD  . IR RADIOLOGIST EVAL & MGMT  05/11/2017  . IR RADIOLOGIST EVAL & MGMT  06/22/2017  . LAPAROSCOPIC GASTRIC BYPASS  2010     [facility]  . LAPAROSCOPIC LYSIS OF ADHESIONS N/A 03/06/2013   Procedure: LAPAROSCOPIC LYSIS OF ADHESIONS AND CLOSURE OF INTERNAL HERNIAS x2;  Surgeon: Adin Hector, MD;  [facility];  Service: General;  Laterality: N/A;  . LAPAROSCOPY N/A 03/06/2013   Procedure: LAPAROSCOPY DIAGNOSTIC;  Surgeon: Adin Hector, MD;  [facility];  Service: General;  Laterality: N/A;  . LEFT HEART CATH AND CORS/GRAFTS ANGIOGRAPHY N/A 06/09/2017   Procedure: LEFT HEART CATH AND CORS/GRAFTS ANGIOGRAPHY;  Surgeon: Belva Crome, MD;  Location: [place] CV LAB;  Service: Cardiovascular;  Laterality: N/A;  . PERIPHERAL VASCULAR CATHETERIZATION Right 04/30/2014   Procedure: EMBOLIZATION right internal iliac;  Surgeon: Elam Dutch, MD;  Location: [place];  Service: Vascular;  Laterality: Right;  . RADIOLOGY WITH ANESTHESIA N/A 05/20/2017   Procedure: Endoleak Repair;  Surgeon: Jacqulynn Cadet, MD;  Location: [place];  Service: Radiology;  Laterality: N/A;    Current Medications: Outpatient Medications Prior to Visit  Medication Sig Dispense Refill  . amLODipine (NORVASC) 10 MG tablet Take 10 mg by mouth daily.    Marland Kitchen aspirin 81 MG tablet Take 81 mg by mouth every morning.     . Brexpiprazole 1 MG TABS Take 1 mg by mouth daily.    Marland Kitchen buPROPion (WELLBUTRIN XL) 300 MG 24 hr tablet Take 300 mg by mouth daily.    . carvedilol (COREG) 25 MG tablet Take 1 tablet (25 mg total) by mouth 2 (two) times daily with a meal. 60 tablet 0  . lisinopril (PRINIVIL,ZESTRIL) 40 MG tablet Take 1 tablet (40 mg total) every morning by mouth. 90 tablet 2  . naltrexone (DEPADE) 50 MG tablet Take 50 mg by mouth daily.    . rosuvastatin (CRESTOR) 40 MG tablet Take 1 tablet (40 mg total) by mouth daily. 90 tablet 3  . ARIPiprazole (ABILIFY) 2 MG tablet Take 2 mg by mouth daily.     No facility-administered medications prior to visit.      Allergies:   Patient has no known allergies.   Social History   Socioeconomic  History  . Marital status: Married    Spouse name: None  . Number of children: None  . Years of education: None  . Highest education level: None  Social Needs  . Financial resource strain: None  . Food insecurity - worry: None  . Food insecurity - inability: None  . Transportation needs - medical: None  . Transportation needs - non-medical: None  Occupational History  . None  Tobacco Use  . Smoking status: Never Smoker  . Smokeless tobacco: Never Used  Substance and Sexual Activity  . Alcohol use: Yes    Comment: occasional  . Drug use: No  . Sexual activity: No  Other Topics Concern  . None  Social History Narrative  . None     Family History:  The patient's family history includes AAA (abdominal aortic aneurysm) in his father; Cancer  in his mother; Heart disease in his brother and father; Lung cancer in his mother.   ROS:   Please see the history of present illness.    Anxiety concerning the underlying coronary disease.  No local complication from prior access site, left radial. All other systems reviewed and are negative.   PHYSICAL EXAM:   VS:  BP 124/86   Pulse 66   Ht 5\' 11"  (1.803 m)   Wt 258 lb 3.2 oz (117.1 kg)   BMI 36.01 kg/m    GEN: Well nourished, well developed, in no acute distress  HEENT: normal  Neck: no JVD, carotid bruits, or masses Cardiac: RRR; no murmurs, rubs, or gallops,no edema  Respiratory:  clear to auscultation bilaterally, normal work of breathing GI: soft, nontender, nondistended, + BS MS: no deformity or atrophy  Skin: warm and dry, no rash Neuro:  Alert and Oriented x 3, Strength and sensation are intact Psych: euthymic mood, full affect  Wt Readings from Last 3 Encounters:  06/22/17 258 lb 3.2 oz (117.1 kg)  06/22/17 255 lb (115.7 kg)  06/15/17 255 lb (115.7 kg)      Studies/Labs Reviewed:   EKG:  EKG  Not repeated.   Recent Labs: 06/03/2017: BUN 19; Creatinine, Ser 1.22; Hemoglobin 11.8; Platelets 187; Potassium 4.2;  Sodium 142   Lipid Panel    Component Value Date/Time   CHOL 204 (H) 11/25/2016 1349   TRIG 68 11/25/2016 1349   HDL 74 11/25/2016 1349   CHOLHDL 2.8 11/25/2016 1349   VLDL 14 11/25/2016 1349   LDLCALC 116 (H) 11/25/2016 1349    Additional studies/ records that were reviewed today include:   CARDIAC CATHETERIZATION 06/2017: Conclusion    Severe aneurysmal coronary disease with a massive native right coronary aneurysm in the mid segment.  There is also a large distal left main aneurysm.  RCA aneurysm measures greater than 20 mm in diameter in the left main aneurysm less than 20 mm.  Total occlusion of the mid to distal RCA.  Distal vessel fills by SVG sequential graft.  Total occlusion of the native circumflex.  Distal vessel fills by collaterals.  80% proximal tubular LAD stenosis with competitive flow due to patent LIMA.  Segmental 60% proximal ramus obstruction  Patent sequential saphenous vein graft to the PDA.  Patent LIMA to the LAD.  No other saphenous vein grafts could be identified.  SVG to diagonal is felt to be occluded.  Left ventricular function is normal.  Hemodynamics are normal.  EF 55%.   RECOMMENDATIONS:    Heart team approach.  Consider endovascular therapy with Graftmaster versus surgical ligation and consideration of repeat coronary bypass.       ASSESSMENT:    1. Coronary aneurysm   2. Coronary artery disease involving coronary bypass graft of native heart with unstable angina pectoris ([place])   3. Endoleak post endovascular aneurysm repair, initial encounter ([place])   4. Essential hypertension      PLAN:  In order of problems listed above:  1. Continued preparation for aneurysm exclusion using Graftmaster covered stents for large right coronary aneurysm.  Risk of perforation, myocardial infarction, bleeding, discussed in detail with patient.  Lack of personal experience in this particular scenario also discussed.  We will plan the procedure  within the next 2 weeks.  Further discussion with colleagues.   2. Adequate myocardial perfusion demonstrated by coronary angiography performed recently.  Plan stent graft of large mid right coronary aneurysm.    Medication Adjustments/Labs  and Tests Ordered: Current medicines are reviewed at length with the patient today.  Concerns regarding medicines are outlined above.  Medication changes, Labs and Tests ordered today are listed in the Patient Instructions below. Patient Instructions  Medication Instructions:  Your physician recommends that you continue on your current medications as directed. Please refer to the Current Medication list given to you today.  Labwork: BMET, CBC and INR today  Testing/Procedures: Your physician has requested that you have a cardiac catheterization. Cardiac catheterization is used to diagnose and/or treat various heart conditions. Doctors may recommend this procedure for a number of different reasons. The most common reason is to evaluate chest pain. Chest pain can be a symptom of coronary artery disease (CAD), and cardiac catheterization can show whether plaque is narrowing or blocking your heart's arteries. This procedure is also used to evaluate the valves, as well as measure the blood flow and oxygen levels in different parts of your heart. For further information please visit [email or web address]. Please follow instruction sheet, as given.   Follow-Up: Your physician recommends that you schedule a follow-up appointment in: 2 weeks after PCI placement.    Any Other Special Instructions Will Be Listed Below (If Applicable).    [place] OFFICE [address] [place] [address] Dept: [phone] Loc: [place]  06/22/2017  You are scheduled for a Cardiac Catheterization/PCI on Tuesday, March 8 with Dr. Daneen Schick.  1. Please arrive at  the [facility] (Main Entrance A) at [facility]: [address] [place], [place] [address] at 5:30A (two hours before your procedure to ensure your preparation). Free valet parking service is available.   Special note: Every effort is made to have your procedure done on time. Please understand that emergencies sometimes delay scheduled procedures.  2. Diet: Do not eat or drink anything after midnight prior to your procedure except sips of water to take medications.  3. Labs: You will need to come back to the office for labs.  4. Medication instructions in preparation for your procedure:   On the morning of your procedure, take your Aspirin and any morning medicines NOT listed above.  You may use sips of water.  5. Plan for one night stay--bring personal belongings. 6. Bring a current list of your medications and current insurance cards. 7. You MUST have a responsible person to drive you home. 8. Someone MUST be with you the first 24 hours after you arrive home or your discharge will be delayed. 9. Please wear clothes that are easy to get on and off and wear slip-on shoes.  Thank you for allowing [place] to care for you!   -- [place] [facility]    If you need a refill on your cardiac medications before your next appointment, please call your pharmacy.      Signed, Sinclair Grooms, MD  06/22/2017 12:25 PM    [facility] [place], [place], [place]  [address] Phone: [phone]; Fax: [phone]

## 2017-06-22 NOTE — Progress Notes (Incomplete)
Chief Complaint: The patient is seen in follow up today s/p ***  History of present illness:  ***  Past Medical History:  Diagnosis Date  . AAA (abdominal aortic aneurysm) ([place])    a. s/p EVAR 2015 with endoleak treated by IR.  Marland Kitchen AKI (acute kidney injury) ([place])   . Chest pain   . CKD (chronic kidney disease), stage II   . Coronary artery disease    a. CABG 2001 at [facility]. b. Cath 06/2017 to evaluate coronary aneurysm, workup in progress as of 06/2017.  Marland Kitchen Deafness in right ear   . Depression   . Dysrhythmia   . 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 History of kidney stones   . Hx of CABG   . Hyperlipidemia   . Hypertension   . Sleep apnea    no longer is an issue  . Urinary frequency   . Vascular anomaly    a. 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    Past Surgical History:  Procedure Laterality Date  . ABDOMINAL AORTIC ENDOVASCULAR STENT GRAFT N/A 04/30/2014   Procedure: ABDOMINAL AORTIC ENDOVASCULAR STENT GRAFT;  Surgeon: Elam Dutch, MD;  Location: [facility] OR;  Service: Vascular;  Laterality: N/A;  . ABDOMINAL AORTIC ENDOVASCULAR STENT GRAFT N/A 05/26/2015   Procedure: ABDOMINAL AORTIC ENDOVASCULAR STENT GRAFT RE-INTERVENTION; REPAIR OF PROXIMAL CUFF;  Surgeon: Elam Dutch, MD;  Location: [place];  Service: Vascular;  Laterality: N/A;  . BACK SURGERY  2009   lower back  . CARDIAC CATHETERIZATION    . COLONOSCOPY    . CORONARY ARTERY BYPASS GRAFT  2001  . CYSTOSCOPY WITH RETROGRADE PYELOGRAM, URETEROSCOPY AND STENT PLACEMENT Bilateral 02/07/2014   Procedure: CYSTOSCOPY WITH BILATERAL RETROGRADE PYELOGRAM, BILATERAL URETEROSCOPY, RIGHT EXTRACTION OF STONES AND RIGHT STENT PLACEMENT;  Surgeon: Jorja Loa, MD;  [facility];  Service: Urology;  Laterality: Bilateral;  . ESOPHAGOGASTRODUODENOSCOPY    . IR AORTAGRAM  ABDOMINAL SERIALOGRAM  05/20/2017  . IR CT SPINE LTD  05/20/2017  . IR EMBO ARTERIAL NOT HEMORR HEMANG INC GUIDE ROADMAPPING  05/20/2017  . IR GENERIC HISTORICAL  11/06/2015   IR RADIOLOGIST EVAL & MGMT 11/06/2015 Jacqulynn Cadet, MD [facility] INTERV RAD  . IR GENERIC HISTORICAL  04/20/2016   IR RADIOLOGIST EVAL & MGMT 04/20/2016 Jacqulynn Cadet, MD [facility] INTERV RAD  . IR RADIOLOGIST EVAL & MGMT  05/11/2017  . LAPAROSCOPIC GASTRIC BYPASS  2010   [facility]  . LAPAROSCOPIC LYSIS OF ADHESIONS N/A 03/06/2013   Procedure: LAPAROSCOPIC LYSIS OF ADHESIONS AND CLOSURE OF INTERNAL HERNIAS x2;  Surgeon: Adin Hector, MD;  [facility];  Service: General;  Laterality: N/A;  . LAPAROSCOPY N/A 03/06/2013   Procedure: LAPAROSCOPY DIAGNOSTIC;  Surgeon: Adin Hector, MD;  [facility];  Service: General;  Laterality: N/A;  . LEFT HEART CATH AND CORS/GRAFTS ANGIOGRAPHY N/A 06/09/2017   Procedure: LEFT HEART CATH AND CORS/GRAFTS ANGIOGRAPHY;  Surgeon: Belva Crome, MD;  Location: [place] CV LAB;  Service: Cardiovascular;  Laterality: N/A;  . PERIPHERAL VASCULAR CATHETERIZATION Right 04/30/2014   Procedure: EMBOLIZATION right internal iliac;  Surgeon: Elam Dutch, MD;  Location: [place];  Service: Vascular;  Laterality: Right;  . RADIOLOGY WITH ANESTHESIA N/A 05/20/2017   Procedure: Endoleak Repair;  Surgeon: Jacqulynn Cadet, MD;  Location: [place];  Service: Radiology;  Laterality: N/A;  Allergies: Patient has no known allergies.  Medications: Prior to Admission medications   Medication Sig Start Date End Date Taking? Authorizing Provider  amLODipine (NORVASC) 10 MG tablet Take 10 mg by mouth daily.   Yes [provider]  aspirin 81 MG tablet Take 81 mg by mouth every morning.    Yes [provider]  Brexpiprazole 1 MG TABS Take 1 mg by mouth daily.   Yes [provider]  buPROPion (WELLBUTRIN XL) 300 MG 24 hr tablet Take 300 mg by mouth daily. 12/12/15  Yes  [provider]  carvedilol (COREG) 25 MG tablet Take 1 tablet (25 mg total) by mouth 2 (two) times daily with a meal. 11/27/16  Yes Hongalgi, Lenis Dickinson, MD  lisinopril (PRINIVIL,ZESTRIL) 40 MG tablet Take 1 tablet (40 mg total) every morning by mouth. 03/09/17  Yes Belva Crome, MD  naltrexone (DEPADE) 50 MG tablet Take 50 mg by mouth daily.   Yes [provider]  rosuvastatin (CRESTOR) 40 MG tablet Take 1 tablet (40 mg total) by mouth daily. 06/03/17 05/29/18 Yes Belva Crome, MD  ARIPiprazole (ABILIFY) 2 MG tablet Take 2 mg by mouth daily.    [provider]     Family History  Problem Relation Age of Onset  . Lung cancer Mother   . Cancer Mother        Lung  . AAA (abdominal aortic aneurysm) Father   . Heart disease Father        before age 72  . Heart disease Brother        before age 21    Social History   Socioeconomic History  . Marital status: Married    Spouse name: Not on file  . Number of children: Not on file  . Years of education: Not on file  . Highest education level: Not on file  Social Needs  . Financial resource strain: Not on file  . Food insecurity - worry: Not on file  . Food insecurity - inability: Not on file  . Transportation needs - medical: Not on file  . Transportation needs - non-medical: Not on file  Occupational History  . Not on file  Tobacco Use  . Smoking status: Never Smoker  . Smokeless tobacco: Never Used  Substance and Sexual Activity  . Alcohol use: Yes    Comment: occasional  . Drug use: No  . Sexual activity: No  Other Topics Concern  . Not on file  Social History Narrative  . Not on file     Vital Signs: BP 125/86   Pulse 68   Temp 98.4 F (36.9 C) (Oral)   Resp 16   Ht 5\' 11"  (1.803 m)   Wt 255 lb (115.7 kg)   SpO2 97%   BMI 35.57 kg/m   Physical Exam  Imaging: No results found.  Labs:  CBC: Recent Labs    11/26/16 0325 11/27/16 0511 05/20/17 0607 06/03/17 1608  WBC 3.9*  3.6* 4.0 4.9  HGB 8.9* 9.2* 11.2* 11.8*  HCT 28.0* 28.7* 34.0* 37.1*  PLT 128* 124* 145* 187    COAGS: Recent Labs    11/25/16 1358 05/20/17 0607 06/03/17 1608  INR 1.10 1.10 1.0  APTT  --  28  --     BMP: Recent Labs    11/26/16 0325 11/27/16 0511 05/20/17 0607 06/03/17 1608  NA 138 138 137 142  K 4.0 4.0 3.8 4.2  CL 105 107 105 102  CO2 24  24 22 22   GLUCOSE 102* 98 93 92  BUN 15 17 17 19   CALCIUM 8.3* 8.2* 8.4* 8.9  CREATININE 1.36* 1.35* 1.22 1.22  GFRNONAA 57* 57* >60 66  GFRAA >60 >60 >60 76    LIVER FUNCTION TESTS: No results for input(s): BILITOT, AST, ALT, ALKPHOS, PROT, ALBUMIN in the last 8760 hours.  Assessment:  ***  Signed: Docia Barrier, PA 06/22/2017, 8:57 AM   Please refer to Dr. Laurence Ferrari attestation of this note for management and plan.

## 2017-06-22 NOTE — Patient Instructions (Addendum)
Medication Instructions:  Your physician recommends that you continue on your current medications as directed. Please refer to the Current Medication list given to you today.  Labwork: BMET, CBC and INR today  Testing/Procedures: Your physician has requested that you have a cardiac catheterization. Cardiac catheterization is used to diagnose and/or treat various heart conditions. Doctors may recommend this procedure for a number of different reasons. The most common reason is to evaluate chest pain. Chest pain can be a symptom of coronary artery disease (CAD), and cardiac catheterization can show whether plaque is narrowing or blocking your heart's arteries. This procedure is also used to evaluate the valves, as well as measure the blood flow and oxygen levels in different parts of your heart. For further information please visit HugeFiesta.[email or web address]. Please follow instruction sheet, as given.   Follow-Up: Your physician recommends that you schedule a follow-up appointment in: 2 weeks after PCI placement.    Any Other Special Instructions Will Be Listed Below (If Applicable).    [place] OFFICE [address], [place] [address] Dept: [phone] Loc: [place]  06/22/2017  You are scheduled for a Cardiac Catheterization/PCI on Friday, March 8 with Dr. Daneen Schick.  1. Please arrive at the [facility] (Main Entrance A) at [facility]: [address] [place], [place] [address] at 5:30A (two hours before your procedure to ensure your preparation). Free valet parking service is available.   Special note: Every effort is made to have your procedure done on time. Please understand that emergencies sometimes delay scheduled procedures.  2. Diet: Do not eat or drink anything after midnight prior to your procedure except sips of water to take medications.  3. Labs: You will  need to come back to the office for labs.  4. Medication instructions in preparation for your procedure:   On the morning of your procedure, take your Aspirin and any morning medicines NOT listed above.  You may use sips of water.  5. Plan for one night stay--bring personal belongings. 6. Bring a current list of your medications and current insurance cards. 7. You MUST have a responsible person to drive you home. 8. Someone MUST be with you the first 24 hours after you arrive home or your discharge will be delayed. 9. Please wear clothes that are easy to get on and off and wear slip-on shoes.  Thank you for allowing [place] to care for you!   -- [facility] [facility]    If you need a refill on your cardiac medications before your next appointment, please call your pharmacy.

## 2017-06-22 NOTE — Progress Notes (Signed)
Thanks for seeing him! 

## 2017-06-22 NOTE — Progress Notes (Signed)
Cardiology Office Note    Date:  06/22/2017   ID:  Daniel Blevins, DOB 1961/04/04, MRN [id number]  PCP:  Lajean Manes, MD  Cardiologist: Sinclair Grooms, MD   Chief Complaint  Patient presents with  . Coronary Artery Disease    History of Present Illness:  Daniel Blevins is a 57 y.o. male with history of diffuse vascular disease including CABG in 2001 at [facility] receiving LIMA to LAD, SVG to RCA, and SVG to diagonal.  History of abdominal aortic aneurysm status post EVAR for abdominal aortic aneurysm.  Recent cath was performed after CT scan demonstrated a growing native right coronary aneurysm, estimated to be greater than 4 cm in diameter.  Angiography demonstrated total occlusion of the mid native right coronary small distal RV branch at the total occlusion and high-grade obstruction proximal to the aneurysm.  He is back today to discuss management.  He saw Dr. Pamalee Leyden for surgical consideration.  Felt that the risk was too high given prior bypass surgery and other comorbidities.  Felt appropriate management strategy should be endovascular either coiling or stent graft.  Discussed the approach with the patient in which we would consider PTCA and angioplasty with Graft Master covered stent to exclude the aneurysm from hemodynamic forces and therefore hopefully prevent future growth.  Discussed in detail with the patient the limited database on this management strategy.  Absence of personal experience with this type entity.  Wide discussion with colleagues including both vascular surgery and interventional radiology.    Past Medical History:  Diagnosis Date  . AAA (abdominal aortic aneurysm) ([place])    a. s/p EVAR 2015 with endoleak treated by IR.  Marland Kitchen AKI (acute kidney injury) ([place])   . Chest pain   . CKD (chronic kidney disease), stage II   . Coronary artery disease    a. CABG 2001 at [facility]. b. Cath 06/2017 to evaluate coronary aneurysm, workup in progress as of 06/2017.  Marland Kitchen  Deafness in right ear   . Depression   . Dysrhythmia   . 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 History of kidney stones   . Hx of CABG   . Hyperlipidemia   . Hypertension   . Sleep apnea    no longer is an issue  . Urinary frequency   . Vascular anomaly    a. 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    Past Surgical History:  Procedure Laterality Date  . ABDOMINAL AORTIC ENDOVASCULAR STENT GRAFT N/A 04/30/2014   Procedure: ABDOMINAL AORTIC ENDOVASCULAR STENT GRAFT;  Surgeon: Elam Dutch, MD;  Location: [facility] OR;  Service: Vascular;  Laterality: N/A;  . ABDOMINAL AORTIC ENDOVASCULAR STENT GRAFT N/A 05/26/2015   Procedure: ABDOMINAL AORTIC ENDOVASCULAR STENT GRAFT RE-INTERVENTION; REPAIR OF PROXIMAL CUFF;  Surgeon: Elam Dutch, MD;  Location: [place];  Service: Vascular;  Laterality: N/A;  . BACK SURGERY  2009   lower back  . CARDIAC CATHETERIZATION    . COLONOSCOPY    . CORONARY ARTERY BYPASS GRAFT  2001  . CYSTOSCOPY WITH RETROGRADE PYELOGRAM, URETEROSCOPY AND STENT PLACEMENT Bilateral 02/07/2014   Procedure: CYSTOSCOPY WITH BILATERAL RETROGRADE PYELOGRAM, BILATERAL URETEROSCOPY, RIGHT EXTRACTION OF STONES AND RIGHT STENT PLACEMENT;  Surgeon: Jorja Loa, MD;  Location[facility];  Service: Urology;  Laterality: Bilateral;  . ESOPHAGOGASTRODUODENOSCOPY    . IR [place]  05/20/2017  . IR CT SPINE LTD  05/20/2017  . IR EMBO ARTERIAL NOT HEMORR HEMANG INC GUIDE ROADMAPPING  05/20/2017  . IR GENERIC HISTORICAL  11/06/2015   IR RADIOLOGIST EVAL & MGMT 11/06/2015 Jacqulynn Cadet, MD [facility] INTERV RAD  . IR GENERIC HISTORICAL  04/20/2016   IR RADIOLOGIST EVAL & MGMT 04/20/2016 Jacqulynn Cadet, MD [facility] INTERV RAD  . IR RADIOLOGIST EVAL & MGMT  05/11/2017  . IR RADIOLOGIST EVAL & MGMT  06/22/2017  . LAPAROSCOPIC GASTRIC BYPASS  2010     [facility]  . LAPAROSCOPIC LYSIS OF ADHESIONS N/A 03/06/2013   Procedure: LAPAROSCOPIC LYSIS OF ADHESIONS AND CLOSURE OF INTERNAL HERNIAS x2;  Surgeon: Adin Hector, MD;  [facility];  Service: General;  Laterality: N/A;  . LAPAROSCOPY N/A 03/06/2013   Procedure: LAPAROSCOPY DIAGNOSTIC;  Surgeon: Adin Hector, MD;  [facility];  Service: General;  Laterality: N/A;  . LEFT HEART CATH AND CORS/GRAFTS ANGIOGRAPHY N/A 06/09/2017   Procedure: LEFT HEART CATH AND CORS/GRAFTS ANGIOGRAPHY;  Surgeon: Belva Crome, MD;  Location: [place] [facility];  Service: Cardiovascular;  Laterality: N/A;  . PERIPHERAL VASCULAR CATHETERIZATION Right 04/30/2014   Procedure: EMBOLIZATION right internal iliac;  Surgeon: Elam Dutch, MD;  Location: [place];  Service: Vascular;  Laterality: Right;  . RADIOLOGY WITH ANESTHESIA N/A 05/20/2017   Procedure: Endoleak Repair;  Surgeon: Jacqulynn Cadet, MD;  Location: [place];  Service: Radiology;  Laterality: N/A;    Current Medications: Outpatient Medications Prior to Visit  Medication Sig Dispense Refill  . amLODipine (NORVASC) 10 MG tablet Take 10 mg by mouth daily.    Marland Kitchen aspirin 81 MG tablet Take 81 mg by mouth every morning.     . Brexpiprazole 1 MG TABS Take 1 mg by mouth daily.    Marland Kitchen buPROPion (WELLBUTRIN XL) 300 MG 24 hr tablet Take 300 mg by mouth daily.    . carvedilol (COREG) 25 MG tablet Take 1 tablet (25 mg total) by mouth 2 (two) times daily with a meal. 60 tablet 0  . lisinopril (PRINIVIL,ZESTRIL) 40 MG tablet Take 1 tablet (40 mg total) every morning by mouth. 90 tablet 2  . naltrexone (DEPADE) 50 MG tablet Take 50 mg by mouth daily.    . rosuvastatin (CRESTOR) 40 MG tablet Take 1 tablet (40 mg total) by mouth daily. 90 tablet 3  . ARIPiprazole (ABILIFY) 2 MG tablet Take 2 mg by mouth daily.     No facility-administered medications prior to visit.      Allergies:   Patient has no known allergies.   Social History   Socioeconomic  History  . Marital status: Married    Spouse name: None  . Number of children: None  . Years of education: None  . Highest education level: None  Social Needs  . Financial resource strain: None  . Food insecurity - worry: None  . Food insecurity - inability: None  . Transportation needs - medical: None  . Transportation needs - non-medical: None  Occupational History  . None  Tobacco Use  . Smoking status: Never Smoker  . Smokeless tobacco: Never Used  Substance and Sexual Activity  . Alcohol use: Yes    Comment: occasional  . Drug use: No  . Sexual activity: No  Other Topics Concern  . None  Social History Narrative  . None     Family History:  The patient's family history includes AAA (abdominal aortic aneurysm) in his father; Cancer  in his mother; Heart disease in his brother and father; Lung cancer in his mother.   ROS:   Please see the history of present illness.    Anxiety concerning the underlying coronary disease.  No local complication from prior access site, left radial. All other systems reviewed and are negative.   PHYSICAL EXAM:   VS:  BP 124/86   Pulse 66   Ht 5\' 11"  (1.803 m)   Wt 258 lb 3.2 oz (117.1 kg)   BMI 36.01 kg/m    GEN: Well nourished, well developed, in no acute distress  HEENT: normal  Neck: no JVD, carotid bruits, or masses Cardiac: RRR; no murmurs, rubs, or gallops,no edema  Respiratory:  clear to auscultation bilaterally, normal work of breathing GI: soft, nontender, nondistended, + BS MS: no deformity or atrophy  Skin: warm and dry, no rash Neuro:  Alert and Oriented x 3, Strength and sensation are intact Psych: euthymic mood, full affect  Wt Readings from Last 3 Encounters:  06/22/17 258 lb 3.2 oz (117.1 kg)  06/22/17 255 lb (115.7 kg)  06/15/17 255 lb (115.7 kg)      Studies/Labs Reviewed:   EKG:  EKG  Not repeated.   Recent Labs: 06/03/2017: BUN 19; Creatinine, Ser 1.22; Hemoglobin 11.8; Platelets 187; Potassium 4.2;  Sodium 142   Lipid Panel    Component Value Date/Time   CHOL 204 (H) 11/25/2016 1349   TRIG 68 11/25/2016 1349   HDL 74 11/25/2016 1349   CHOLHDL 2.8 11/25/2016 1349   VLDL 14 11/25/2016 1349   LDLCALC 116 (H) 11/25/2016 1349    Additional studies/ records that were reviewed today include:   CARDIAC CATHETERIZATION 06/2017: Conclusion    Severe aneurysmal coronary disease with a massive native right coronary aneurysm in the mid segment.  There is also a large distal left main aneurysm.  RCA aneurysm measures greater than 20 mm in diameter in the left main aneurysm less than 20 mm.  Total occlusion of the mid to distal RCA.  Distal vessel fills by SVG sequential graft.  Total occlusion of the native circumflex.  Distal vessel fills by collaterals.  80% proximal tubular LAD stenosis with competitive flow due to patent LIMA.  Segmental 60% proximal ramus obstruction  Patent sequential saphenous vein graft to the PDA.  Patent LIMA to the LAD.  No other saphenous vein grafts could be identified.  SVG to diagonal is felt to be occluded.  Left ventricular function is normal.  Hemodynamics are normal.  EF 55%.   RECOMMENDATIONS:    Heart team approach.  Consider endovascular therapy with Graftmaster versus surgical ligation and consideration of repeat coronary bypass.       ASSESSMENT:    1. Coronary aneurysm   2. Coronary artery disease involving coronary bypass graft of native heart with unstable angina pectoris ([place])   3. Endoleak post endovascular aneurysm repair, initial encounter ([place])   4. Essential hypertension      PLAN:  In order of problems listed above:  1. Continued preparation for aneurysm exclusion using Graftmaster covered stents for large right coronary aneurysm.  Risk of perforation, myocardial infarction, bleeding, discussed in detail with patient.  Lack of personal experience in this particular scenario also discussed.  We will plan the procedure  within the next 2 weeks.  Further discussion with colleagues.   2. Adequate myocardial perfusion demonstrated by coronary angiography performed recently.  Plan stent graft of large mid right coronary aneurysm.    Medication Adjustments/Labs  and Tests Ordered: Current medicines are reviewed at length with the patient today.  Concerns regarding medicines are outlined above.  Medication changes, Labs and Tests ordered today are listed in the Patient Instructions below. Patient Instructions  Medication Instructions:  Your physician recommends that you continue on your current medications as directed. Please refer to the Current Medication list given to you today.  Labwork: BMET, CBC and INR today  Testing/Procedures: Your physician has requested that you have a cardiac catheterization. Cardiac catheterization is used to diagnose and/or treat various heart conditions. Doctors may recommend this procedure for a number of different reasons. The most common reason is to evaluate chest pain. Chest pain can be a symptom of coronary artery disease (CAD), and cardiac catheterization can show whether plaque is narrowing or blocking your heart's arteries. This procedure is also used to evaluate the valves, as well as measure the blood flow and oxygen levels in different parts of your heart. For further information please visit [email or web address]. Please follow instruction sheet, as given.   Follow-Up: Your physician recommends that you schedule a follow-up appointment in: 2 weeks after PCI placement.    Any Other Special Instructions Will Be Listed Below (If Applicable).    [facility] [address] [place] [address] Dept: [phone] Loc: [place]  06/22/2017  You are scheduled for a Cardiac Catheterization/PCI on Tuesday, March 8 with Dr. Daneen Schick.  1. Please arrive at  the [facility] (Main Entrance A) at [facility]: [address] [place], [place] [address] at 5:30A (two hours before your procedure to ensure your preparation). Free valet parking service is available.   Special note: Every effort is made to have your procedure done on time. Please understand that emergencies sometimes delay scheduled procedures.  2. Diet: Do not eat or drink anything after midnight prior to your procedure except sips of water to take medications.  3. Labs: You will need to come back to the office for labs.  4. Medication instructions in preparation for your procedure:   On the morning of your procedure, take your Aspirin and any morning medicines NOT listed above.  You may use sips of water.  5. Plan for one night stay--bring personal belongings. 6. Bring a current list of your medications and current insurance cards. 7. You MUST have a responsible person to drive you home. 8. Someone MUST be with you the first 24 hours after you arrive home or your discharge will be delayed. 9. Please wear clothes that are easy to get on and off and wear slip-on shoes.  Thank you for allowing [place] to care for you!   -- [facility]    If you need a refill on your cardiac medications before your next appointment, please call your pharmacy.      Signed, Sinclair Grooms, MD  06/22/2017 12:25 PM    [facility] [place], [place], [place]  [address] Phone: [phone]; Fax: [phone]

## 2017-06-22 NOTE — Progress Notes (Signed)
Chief Complaint: The patient is seen in follow up today s/p AAA Endoleak repair 05/20/17  History of present illness: Daniel Blevins is a 57 y.o. male who underwent AAA repair with Dr. Oneida Alar in 2015.  He subsequently developed an endoleak and was referred to interventional radiology for management.  He underwent sucessful embolization back in 2017, however developed abdominal pain with associated CT findings demonstrating further progression of his endoleak.  After discussion with Dr. Laurence Ferrari, he underwent direct stick endoleak repair on 05/20/17.  Patient tolerated the procedure well and was able to discharge home the same day.  Since discharge, he has been doing well. BP well controlled and now takes Crestor. He maintains regular activity and denies LE pain, claudication, numbness, or tingling. His only complaint is occasional left groin pain which has been present for some time without known cause.   During his most recent clinic visit with Dr. Laurence Ferrari several other aneurysms were also identified including aneurysms of the coronary artery, gluteal artery, and popliteal arteries.  He has been referred to Cardiology and Cardiothoracic Surgery for further evaluation.   Past Medical History:  Diagnosis Date  . AAA (abdominal aortic aneurysm) ([place])    a. s/p EVAR 2015 with endoleak treated by IR.  Marland Kitchen AKI (acute kidney injury) ([place])   . Chest pain   . CKD (chronic kidney disease), stage II   . Coronary artery disease    a. CABG 2001 at [facility]. b. Cath 06/2017 to evaluate coronary aneurysm, workup in progress as of 06/2017.  Marland Kitchen Deafness in right ear   . Depression   . Dysrhythmia   . 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 History of kidney stones   . Hx of CABG   . Hyperlipidemia   . Hypertension   . Sleep apnea    no longer is an issue  . Urinary frequency   . Vascular anomaly    a. multiple vascular aneurysms noted - coronary, AAA,  bilateral popliteal artery aneurysms as well as  aneurysm in the superior gluteal artery and left internal iliac artery.    Past Surgical History:  Procedure Laterality Date  . ABDOMINAL AORTIC ENDOVASCULAR STENT GRAFT N/A 04/30/2014   Procedure: ABDOMINAL AORTIC ENDOVASCULAR STENT GRAFT;  Surgeon: Elam Dutch, MD;  [facility] OR;  Service: Vascular;  Laterality: N/A;  . ABDOMINAL AORTIC ENDOVASCULAR STENT GRAFT N/A 05/26/2015   Procedure: ABDOMINAL AORTIC ENDOVASCULAR STENT GRAFT RE-INTERVENTION; REPAIR OF PROXIMAL CUFF;  Surgeon: Elam Dutch, MD;  Location: [place];  Service: Vascular;  Laterality: N/A;  . BACK SURGERY  2009   lower back  . CARDIAC CATHETERIZATION    . COLONOSCOPY    . CORONARY ARTERY BYPASS GRAFT  2001  . CYSTOSCOPY WITH RETROGRADE PYELOGRAM, URETEROSCOPY AND STENT PLACEMENT Bilateral 02/07/2014   Procedure: CYSTOSCOPY WITH BILATERAL RETROGRADE PYELOGRAM, BILATERAL URETEROSCOPY, RIGHT EXTRACTION OF STONES AND RIGHT STENT PLACEMENT;  Surgeon: Jorja Loa, MD;  [facility];  Service: Urology;  Laterality: Bilateral;  . ESOPHAGOGASTRODUODENOSCOPY    . IR AORTAGRAM ABDOMINAL SERIALOGRAM  05/20/2017  . IR CT SPINE LTD  05/20/2017  . IR EMBO ARTERIAL NOT HEMORR HEMANG INC GUIDE ROADMAPPING  05/20/2017  . IR GENERIC HISTORICAL  11/06/2015   IR RADIOLOGIST EVAL & MGMT 11/06/2015 Jacqulynn Cadet, MD [facility] INTERV RAD  . IR GENERIC HISTORICAL  04/20/2016   IR RADIOLOGIST EVAL & MGMT 04/20/2016 Jacqulynn Cadet, MD [facility] INTERV RAD  . IR RADIOLOGIST EVAL & MGMT  05/11/2017  .  LAPAROSCOPIC GASTRIC BYPASS  2010   [facility]  . LAPAROSCOPIC LYSIS OF ADHESIONS N/A 03/06/2013   Procedure: LAPAROSCOPIC LYSIS OF ADHESIONS AND CLOSURE OF INTERNAL HERNIAS x2;  Surgeon: Adin Hector, MD;  [facility];  Service: General;  Laterality: N/A;  . LAPAROSCOPY N/A 03/06/2013   Procedure: LAPAROSCOPY DIAGNOSTIC;  Surgeon: Adin Hector, MD;  [facility];  Service: General;  Laterality: N/A;  . LEFT HEART CATH AND  CORS/GRAFTS ANGIOGRAPHY N/A 06/09/2017   Procedure: LEFT HEART CATH AND CORS/GRAFTS ANGIOGRAPHY;  Surgeon: Belva Crome, MD;  Location: [place];  Service: Cardiovascular;  Laterality: N/A;  . PERIPHERAL VASCULAR CATHETERIZATION Right 04/30/2014   Procedure: EMBOLIZATION right internal iliac;  Surgeon: Elam Dutch, MD;  Location: [place];  Service: Vascular;  Laterality: Right;  . RADIOLOGY WITH ANESTHESIA N/A 05/20/2017   Procedure: Endoleak Repair;  Surgeon: Jacqulynn Cadet, MD;  Location: [place];  Service: Radiology;  Laterality: N/A;    Allergies: Patient has no known allergies.  Medications: Prior to Admission medications   Medication Sig Start Date End Date Taking? Authorizing Provider  amLODipine (NORVASC) 10 MG tablet Take 10 mg by mouth daily.   Yes [provider]  aspirin 81 MG tablet Take 81 mg by mouth every morning.    Yes [provider]  Brexpiprazole 1 MG TABS Take 1 mg by mouth daily.   Yes [provider]  buPROPion (WELLBUTRIN XL) 300 MG 24 hr tablet Take 300 mg by mouth daily. 12/12/15  Yes [provider]  carvedilol (COREG) 25 MG tablet Take 1 tablet (25 mg total) by mouth 2 (two) times daily with a meal. 11/27/16  Yes Hongalgi, Lenis Dickinson, MD  lisinopril (PRINIVIL,ZESTRIL) 40 MG tablet Take 1 tablet (40 mg total) every morning by mouth. 03/09/17  Yes Belva Crome, MD  naltrexone (DEPADE) 50 MG tablet Take 50 mg by mouth daily.   Yes [provider]  rosuvastatin (CRESTOR) 40 MG tablet Take 1 tablet (40 mg total) by mouth daily. 06/03/17 05/29/18 Yes Belva Crome, MD  ARIPiprazole (ABILIFY) 2 MG tablet Take 2 mg by mouth daily.    [provider]     Family History  Problem Relation Age of Onset  . Lung cancer Mother   . Cancer Mother        Lung  . AAA (abdominal aortic aneurysm) Father   . Heart disease Father        before age 85  . Heart disease Brother        before age 71    Social History     Socioeconomic History  . Marital status: Married    Spouse name: Not on file  . Number of children: Not on file  . Years of education: Not on file  . Highest education level: Not on file  Social Needs  . Financial resource strain: Not on file  . Food insecurity - worry: Not on file  . Food insecurity - inability: Not on file  . Transportation needs - medical: Not on file  . Transportation needs - non-medical: Not on file  Occupational History  . Not on file  Tobacco Use  . Smoking status: Never Smoker  . Smokeless tobacco: Never Used  Substance and Sexual Activity  . Alcohol use: Yes    Comment: occasional  . Drug use: No  . Sexual activity: No  Other Topics Concern  . Not on file  Social History  Narrative  . Not on file     Vital Signs: BP 125/86   Pulse 68   Temp 98.4 F (36.9 C) (Oral)   Resp 16   Ht 5\' 11"  (1.803 m)   Wt 255 lb (115.7 kg)   SpO2 97%   BMI 35.57 kg/m   Physical Exam  Constitutional: He is oriented to person, place, and time. He appears well-developed.  Cardiovascular: Normal rate, regular rhythm and normal heart sounds.  Pulmonary/Chest: Effort normal and breath sounds normal. No respiratory distress.  Abdominal: Soft. He exhibits no distension. There is no tenderness.  Neurological: He is alert and oriented to person, place, and time.  Skin: Skin is warm and dry.  Puncture site well-healed and without issue.  Psychiatric: He has a normal mood and affect. His behavior is normal. Judgment and thought content normal.  Nursing note and vitals reviewed.   Imaging: No results found.  Labs:  CBC: Recent Labs    11/26/16 0325 11/27/16 0511 05/20/17 0607 06/03/17 1608  WBC 3.9* 3.6* 4.0 4.9  HGB 8.9* 9.2* 11.2* 11.8*  HCT 28.0* 28.7* 34.0* 37.1*  PLT 128* 124* 145* 187    COAGS: Recent Labs    11/25/16 1358 05/20/17 0607 06/03/17 1608  INR 1.10 1.10 1.0  APTT  --  28  --     BMP: Recent Labs    11/26/16 0325  11/27/16 0511 05/20/17 0607 06/03/17 1608  NA 138 138 137 142  K 4.0 4.0 3.8 4.2  CL 105 107 105 102  CO2 24 24 22 22   GLUCOSE 102* 98 93 92  BUN 15 17 17 19   CALCIUM 8.3* 8.2* 8.4* 8.9  CREATININE 1.36* 1.35* 1.22 1.22  GFRNONAA 57* 57* >60 66  GFRAA >60 >60 >60 76    LIVER FUNCTION TESTS: No results for input(s): BILITOT, AST, ALT, ALKPHOS, PROT, ALBUMIN in the last 8760 hours.  Assessment: Patient with abdominal aortic endoleak with enlarging sac treated with direct puncture emobolization 05/20/17. Patient has been doing well since procedure.  His abdominal pain has improved.  He has no new complaints.  He continues to see Dr. Daneen Schick for ongoing evaluation of his cardiac aneurysms. To date, it does not appear as though surgical intervention is required for these aneurysms, however he does have an appointment with cardiology later today.  Reviewed case with Dr. Laurence Ferrari who has also seen patient today and answered all questions. The gluteal and popliteal artery aneurysms warrant monitoring and may require intervention in time. However, the risks and benefits of potential intervention at these locations would require great consideration.  Plan for repeat CT imaging in 6 months for AAA. He should continue to take 81 mg aspirin daily.  Patient agreeable to plan.   Signed: Docia Barrier, PA 06/22/2017, 9:02 AM   Please refer to Dr. Laurence Ferrari attestation of this note for management and plan.

## 2017-06-23 ENCOUNTER — Ambulatory Visit: Payer: 59 | Admitting: Physician Assistant

## 2017-06-30 ENCOUNTER — Other Ambulatory Visit: Payer: 59 | Admitting: *Deleted

## 2017-06-30 DIAGNOSIS — I2541 Coronary artery aneurysm: Secondary | ICD-10-CM

## 2017-06-30 LAB — CBC
Hematocrit: 35.2 % — ABNORMAL LOW (ref 37.5–51.0)
Hemoglobin: 11.4 g/dL — ABNORMAL LOW (ref 13.0–17.7)
MCH: 26.6 pg (ref 26.6–33.0)
MCHC: 32.4 g/dL (ref 31.5–35.7)
MCV: 82 fL (ref 79–97)
Platelets: 154 10*3/uL (ref 150–379)
RBC: 4.29 x10E6/uL (ref 4.14–5.80)
RDW: 15.2 % (ref 12.3–15.4)
WBC: 4 10*3/uL (ref 3.4–10.8)

## 2017-06-30 LAB — BASIC METABOLIC PANEL
BUN/Creatinine Ratio: 14 (ref 9–20)
BUN: 16 mg/dL (ref 6–24)
CALCIUM: 8.5 mg/dL — AB (ref 8.7–10.2)
CO2: 20 mmol/L (ref 20–29)
Chloride: 104 mmol/L (ref 96–106)
Creatinine, Ser: 1.12 mg/dL (ref 0.76–1.27)
GFR calc non Af Amer: 73 mL/min/{1.73_m2} (ref >59)
GFR, EST AFRICAN AMERICAN: 84 mL/min/{1.73_m2} (ref >59)
GLUCOSE: 103 mg/dL — AB (ref 65–99)
Potassium: 4 mmol/L (ref 3.5–5.2)
Sodium: 140 mmol/L (ref 134–144)

## 2017-06-30 LAB — PROTIME-INR
INR: 1.1 (ref 0.8–1.2)
Prothrombin Time: 11.2 s (ref 9.1–12.0)

## 2017-07-04 ENCOUNTER — Telehealth: Payer: Self-pay | Admitting: Interventional Cardiology

## 2017-07-04 DIAGNOSIS — I25700 Atherosclerosis of coronary artery bypass graft(s), unspecified, with unstable angina pectoris: Secondary | ICD-10-CM

## 2017-07-04 DIAGNOSIS — I2541 Coronary artery aneurysm: Secondary | ICD-10-CM

## 2017-07-04 NOTE — Telephone Encounter (Signed)
Patient calling to r/s cath lab.

## 2017-07-04 NOTE — Telephone Encounter (Signed)
Moved pt to 3/15 for cath.  Pt will come Monday 3/11 for labs. Pt verbalized understanding and was in agreement with this plan.

## 2017-07-12 ENCOUNTER — Other Ambulatory Visit: Payer: 59

## 2017-07-13 ENCOUNTER — Other Ambulatory Visit: Payer: 59

## 2017-07-13 DIAGNOSIS — I25700 Atherosclerosis of coronary artery bypass graft(s), unspecified, with unstable angina pectoris: Secondary | ICD-10-CM

## 2017-07-13 DIAGNOSIS — I2541 Coronary artery aneurysm: Secondary | ICD-10-CM

## 2017-07-14 ENCOUNTER — Telehealth: Payer: Self-pay | Admitting: *Deleted

## 2017-07-14 LAB — BASIC METABOLIC PANEL
BUN / CREAT RATIO: 13 (ref 9–20)
BUN: 14 mg/dL (ref 6–24)
CO2: 21 mmol/L (ref 20–29)
Calcium: 8.5 mg/dL — ABNORMAL LOW (ref 8.7–10.2)
Chloride: 104 mmol/L (ref 96–106)
Creatinine, Ser: 1.1 mg/dL (ref 0.76–1.27)
GFR, EST AFRICAN AMERICAN: 86 mL/min/{1.73_m2} (ref >59)
GFR, EST NON AFRICAN AMERICAN: 75 mL/min/{1.73_m2} (ref >59)
GLUCOSE: 127 mg/dL — AB (ref 65–99)
Potassium: 3.9 mmol/L (ref 3.5–5.2)
SODIUM: 143 mmol/L (ref 134–144)

## 2017-07-14 LAB — CBC
HEMATOCRIT: 36.6 % — AB (ref 37.5–51.0)
HEMOGLOBIN: 12.1 g/dL — AB (ref 13.0–17.7)
MCH: 26.4 pg — ABNORMAL LOW (ref 26.6–33.0)
MCHC: 33.1 g/dL (ref 31.5–35.7)
MCV: 80 fL (ref 79–97)
Platelets: 162 10*3/uL (ref 150–379)
RBC: 4.58 x10E6/uL (ref 4.14–5.80)
RDW: 16.1 % — ABNORMAL HIGH (ref 12.3–15.4)
WBC: 3.3 10*3/uL — ABNORMAL LOW (ref 3.4–10.8)

## 2017-07-14 LAB — PROTIME-INR
INR: 1.1 (ref 0.8–1.2)
Prothrombin Time: 11.1 s (ref 9.1–12.0)

## 2017-07-14 NOTE — Telephone Encounter (Signed)
Pt contacted pre-catheterization scheduled at [facility] for: Friday July 15, 2017 7:30 AM Verified arrival time and place: [place] at: 5:30 AM Nothing to eat or drink after midnight. Verified no known allergies. Verified no diabetes medications  Verified AM meds can be  taken pre-cath with sip of water including: ASA 81 mg  Confirmed patient has responsible person to drive home post procedure and observe patient for 24 hours: yes

## 2017-07-15 ENCOUNTER — Other Ambulatory Visit: Payer: Self-pay

## 2017-07-15 ENCOUNTER — Encounter (HOSPITAL_COMMUNITY): Payer: Self-pay | Admitting: Interventional Cardiology

## 2017-07-15 ENCOUNTER — Encounter (HOSPITAL_COMMUNITY)
Admission: RE | Disposition: A | Discharge status: Home / Self Care | Payer: Self-pay | Source: Ambulatory Visit | Attending: Interventional Cardiology

## 2017-07-15 ENCOUNTER — Ambulatory Visit (HOSPITAL_COMMUNITY)
Admission: RE | Admit: 2017-07-15 | Discharge: 2017-07-16 | Disposition: A | Discharge status: Home / Self Care | Payer: 59 | Source: Ambulatory Visit | Attending: Interventional Cardiology | Admitting: Interventional Cardiology

## 2017-07-15 DIAGNOSIS — I25810 Atherosclerosis of coronary artery bypass graft(s) without angina pectoris: Secondary | ICD-10-CM | POA: Diagnosis present

## 2017-07-15 DIAGNOSIS — G4730 Sleep apnea, unspecified: Secondary | ICD-10-CM | POA: Insufficient documentation

## 2017-07-15 DIAGNOSIS — I2582 Chronic total occlusion of coronary artery: Secondary | ICD-10-CM | POA: Insufficient documentation

## 2017-07-15 DIAGNOSIS — I2541 Coronary artery aneurysm: Secondary | ICD-10-CM | POA: Diagnosis present

## 2017-07-15 DIAGNOSIS — I129 Hypertensive chronic kidney disease with stage 1 through stage 4 chronic kidney disease, or unspecified chronic kidney disease: Secondary | ICD-10-CM | POA: Diagnosis not present

## 2017-07-15 DIAGNOSIS — D649 Anemia, unspecified: Secondary | ICD-10-CM | POA: Diagnosis not present

## 2017-07-15 DIAGNOSIS — F329 Major depressive disorder, single episode, unspecified: Secondary | ICD-10-CM | POA: Diagnosis not present

## 2017-07-15 DIAGNOSIS — E785 Hyperlipidemia, unspecified: Secondary | ICD-10-CM | POA: Insufficient documentation

## 2017-07-15 DIAGNOSIS — I25700 Atherosclerosis of coronary artery bypass graft(s), unspecified, with unstable angina pectoris: Secondary | ICD-10-CM | POA: Insufficient documentation

## 2017-07-15 DIAGNOSIS — Z9884 Bariatric surgery status: Secondary | ICD-10-CM | POA: Insufficient documentation

## 2017-07-15 DIAGNOSIS — Z7982 Long term (current) use of aspirin: Secondary | ICD-10-CM | POA: Diagnosis not present

## 2017-07-15 DIAGNOSIS — D696 Thrombocytopenia, unspecified: Secondary | ICD-10-CM | POA: Diagnosis not present

## 2017-07-15 DIAGNOSIS — I10 Essential (primary) hypertension: Secondary | ICD-10-CM | POA: Diagnosis present

## 2017-07-15 DIAGNOSIS — N182 Chronic kidney disease, stage 2 (mild): Secondary | ICD-10-CM | POA: Diagnosis not present

## 2017-07-15 HISTORY — PX: CORONARY STENT INTERVENTION: CATH118234

## 2017-07-15 LAB — POCT ACTIVATED CLOTTING TIME
ACTIVATED CLOTTING TIME: 175 s
Activated Clotting Time: 219 seconds
Activated Clotting Time: 230 seconds
Activated Clotting Time: 219 seconds
Activated Clotting Time: 224 seconds
Activated Clotting Time: 268 seconds

## 2017-07-15 SURGERY — CORONARY STENT INTERVENTION
Anesthesia: LOCAL

## 2017-07-15 MED ORDER — SODIUM CHLORIDE 0.9% FLUSH: 3.0000 mL | Freq: Two times a day (BID) | INTRAVENOUS | Status: DC

## 2017-07-15 MED ORDER — ONDANSETRON HCL 4 MG/2ML IJ SOLN: 4.0000 mg | Freq: Four times a day (QID) | INTRAMUSCULAR | Status: DC | PRN

## 2017-07-15 MED ORDER — SODIUM CHLORIDE 0.9% FLUSH: 3.0000 mL | INTRAVENOUS | Status: DC | PRN

## 2017-07-15 MED ORDER — ENOXAPARIN SODIUM 40 MG/0.4ML ~~LOC~~ SOLN: 40.0000 mg | SUBCUTANEOUS | Status: DC

## 2017-07-15 MED ORDER — HEPARIN SODIUM (PORCINE) 1000 UNIT/ML IJ SOLN
INTRAMUSCULAR | Status: DC | PRN
  Administered 2017-07-15: 2000 [IU] via INTRAVENOUS
  Administered 2017-07-15: 2500 [IU] via INTRAVENOUS
  Administered 2017-07-15: 3000 [IU] via INTRAVENOUS
  Administered 2017-07-15: 5000 [IU] via INTRAVENOUS
  Administered 2017-07-15: 3000 [IU] via INTRAVENOUS
  Administered 2017-07-15: 2000 [IU] via INTRAVENOUS

## 2017-07-15 MED ORDER — LABETALOL HCL 5 MG/ML IV SOLN: 10.0000 mg | INTRAVENOUS | Status: AC | PRN

## 2017-07-15 MED ORDER — IOPAMIDOL (ISOVUE-370) INJECTION 76%
INTRAVENOUS | Status: AC
  Filled 2017-07-15: qty 100

## 2017-07-15 MED ORDER — ASPIRIN 81 MG PO CHEW: 81.0000 mg | CHEWABLE_TABLET | ORAL | Status: DC

## 2017-07-15 MED ORDER — ASPIRIN EC 81 MG PO TBEC
81.0000 mg | DELAYED_RELEASE_TABLET | ORAL | Status: DC
  Administered 2017-07-16: 81 mg via ORAL
  Filled 2017-07-15: qty 1

## 2017-07-15 MED ORDER — CARVEDILOL 12.5 MG PO TABS
25.0000 mg | ORAL_TABLET | Freq: Two times a day (BID) | ORAL | Status: DC
  Administered 2017-07-15 – 2017-07-16 (×2): 25 mg via ORAL
  Filled 2017-07-15 (×2): qty 2

## 2017-07-15 MED ORDER — ASPIRIN 81 MG PO CHEW: 81.0000 mg | CHEWABLE_TABLET | Freq: Every day | ORAL | Status: DC

## 2017-07-15 MED ORDER — ACETAMINOPHEN 325 MG PO TABS: 650.0000 mg | ORAL_TABLET | ORAL | Status: DC | PRN

## 2017-07-15 MED ORDER — IOPAMIDOL (ISOVUE-370) INJECTION 76%
INTRAVENOUS | Status: DC | PRN
  Administered 2017-07-15: 225 mL via INTRA_ARTERIAL

## 2017-07-15 MED ORDER — LIDOCAINE HCL (PF) 1 % IJ SOLN
INTRAMUSCULAR | Status: DC | PRN
  Administered 2017-07-15: 15 mL

## 2017-07-15 MED ORDER — SODIUM CHLORIDE 0.9 % IV SOLN: 250.0000 mL | INTRAVENOUS | Status: DC | PRN

## 2017-07-15 MED ORDER — BUPROPION HCL ER (XL) 300 MG PO TB24
300.0000 mg | ORAL_TABLET | Freq: Every day | ORAL | Status: DC
  Administered 2017-07-16: 300 mg via ORAL
  Filled 2017-07-15: qty 1

## 2017-07-15 MED ORDER — HEPARIN SODIUM (PORCINE) 1000 UNIT/ML IJ SOLN
INTRAMUSCULAR | Status: AC
  Filled 2017-07-15: qty 1

## 2017-07-15 MED ORDER — HEPARIN (PORCINE) IN NACL 2-0.9 UNIT/ML-% IJ SOLN
INTRAMUSCULAR | Status: AC | PRN
  Administered 2017-07-15: 500 mL via INTRA_ARTERIAL

## 2017-07-15 MED ORDER — MIDAZOLAM HCL 2 MG/2ML IJ SOLN
INTRAMUSCULAR | Status: AC
  Filled 2017-07-15: qty 2

## 2017-07-15 MED ORDER — OXYCODONE HCL 5 MG PO TABS
5.0000 mg | ORAL_TABLET | ORAL | Status: DC | PRN
  Administered 2017-07-16: 01:00:00 10 mg via ORAL
  Filled 2017-07-15: qty 2

## 2017-07-15 MED ORDER — MIDAZOLAM HCL 2 MG/2ML IJ SOLN
INTRAMUSCULAR | Status: DC | PRN
  Administered 2017-07-15 (×3): 1 mg via INTRAVENOUS

## 2017-07-15 MED ORDER — HYDRALAZINE HCL 20 MG/ML IJ SOLN: 5.0000 mg | INTRAMUSCULAR | Status: AC | PRN

## 2017-07-15 MED ORDER — HEPARIN (PORCINE) IN NACL 2-0.9 UNIT/ML-% IJ SOLN
INTRAMUSCULAR | Status: AC
  Filled 2017-07-15: qty 500

## 2017-07-15 MED ORDER — FENTANYL CITRATE (PF) 100 MCG/2ML IJ SOLN
INTRAMUSCULAR | Status: AC
Start: 2017-07-15 — End: 2017-07-15
  Filled 2017-07-15: qty 2

## 2017-07-15 MED ORDER — OXYCODONE HCL 5 MG PO TABS: 5.0000 mg | ORAL_TABLET | ORAL | Status: DC | PRN

## 2017-07-15 MED ORDER — SODIUM CHLORIDE 0.9 % WEIGHT BASED INFUSION: 1.0000 mL/kg/h | INTRAVENOUS | Status: AC

## 2017-07-15 MED ORDER — SODIUM CHLORIDE 0.9 % WEIGHT BASED INFUSION: 1.0000 mL/kg/h | INTRAVENOUS | Status: DC

## 2017-07-15 MED ORDER — AMLODIPINE BESYLATE 5 MG PO TABS
10.0000 mg | ORAL_TABLET | Freq: Every day | ORAL | Status: DC
  Administered 2017-07-16: 10 mg via ORAL
  Filled 2017-07-15: qty 2

## 2017-07-15 MED ORDER — ATORVASTATIN CALCIUM 80 MG PO TABS: 80.0000 mg | ORAL_TABLET | Freq: Every day | ORAL | Status: DC

## 2017-07-15 MED ORDER — IOPAMIDOL (ISOVUE-370) INJECTION 76%
INTRAVENOUS | Status: AC
  Filled 2017-07-15: qty 125

## 2017-07-15 MED ORDER — FENTANYL CITRATE (PF) 100 MCG/2ML IJ SOLN
INTRAMUSCULAR | Status: DC | PRN
  Administered 2017-07-15 (×2): 50 ug via INTRAVENOUS
  Administered 2017-07-15: 25 ug via INTRAVENOUS
  Administered 2017-07-15: 50 ug via INTRAVENOUS

## 2017-07-15 MED ORDER — HEPARIN (PORCINE) IN NACL 2-0.9 UNIT/ML-% IJ SOLN
INTRAMUSCULAR | Status: AC
  Filled 2017-07-15: qty 1000

## 2017-07-15 MED ORDER — FENTANYL CITRATE (PF) 100 MCG/2ML IJ SOLN
INTRAMUSCULAR | Status: AC
  Filled 2017-07-15: qty 2

## 2017-07-15 MED ORDER — ROSUVASTATIN CALCIUM 40 MG PO TABS: 40.0000 mg | ORAL_TABLET | Freq: Every day | ORAL | Status: DC

## 2017-07-15 MED ORDER — LISINOPRIL 40 MG PO TABS
40.0000 mg | ORAL_TABLET | Freq: Every morning | ORAL | Status: DC
  Administered 2017-07-16: 08:00:00 40 mg via ORAL
  Filled 2017-07-15: qty 1

## 2017-07-15 MED ORDER — HEPARIN (PORCINE) IN NACL 2-0.9 UNIT/ML-% IJ SOLN
INTRAMUSCULAR | Status: DC | PRN
  Administered 2017-07-15: 500 mL

## 2017-07-15 MED ORDER — SODIUM CHLORIDE 0.9 % WEIGHT BASED INFUSION
3.0000 mL/kg/h | INTRAVENOUS | Status: DC
  Administered 2017-07-15: 3 mL/kg/h via INTRAVENOUS

## 2017-07-15 MED ORDER — TICAGRELOR 90 MG PO TABS: 90.0000 mg | ORAL_TABLET | Freq: Two times a day (BID) | ORAL | Status: DC

## 2017-07-15 MED ORDER — BREXPIPRAZOLE 1 MG PO TABS
1.0000 mg | ORAL_TABLET | Freq: Every day | ORAL | Status: DC
  Administered 2017-07-16: 1 mg via ORAL
  Filled 2017-07-15: qty 1

## 2017-07-15 SURGICAL SUPPLY — 31 items
CATH INFINITI JR4 5F (CATHETERS) ×2 IMPLANT
CATH MICROGUIDE FINCRSS 150 CM (MICROCATHETER) ×1 IMPLANT
CATH OPTICROSS 40MHZ (CATHETERS) ×2 IMPLANT
CATH RENEGADE STC 130X20 (CATHETERS) ×1 IMPLANT
CATH VISTA GUIDE 7FR AL 1 (CATHETERS) ×2 IMPLANT
CATH VISTA GUIDE 7FR AR1 (CATHETERS) ×2 IMPLANT
CATH VISTA GUIDE 7FR H STICK (CATHETERS) ×2 IMPLANT
CATH VISTA GUIDE 7FR JR4 (CATHETERS) ×2 IMPLANT
CATH VISTA GUIDE 7FR RCB (CATHETERS) ×2 IMPLANT
CATHETER RENEGADE STC 130X20 (CATHETERS) ×2
COIL IDC 2D HELICAL 4MMX15CM (Embolic) ×2 IMPLANT
COIL IDC 2D HELICAL 5MMX15CM (Embolic) ×2 IMPLANT
COIL IDC 2D HELICAL 6MMX20CM (Embolic) ×2 IMPLANT
COIL INTERLOCK 20X50 (Embolic) ×2 IMPLANT
COIL INTERLOCK 22X60 (Embolic) ×2 IMPLANT
COVER PRB 48X5XTLSCP FOLD TPE (BAG) ×1 IMPLANT
COVER PROBE 5X48 (BAG) ×1
KIT ENCORE 26 ADVANTAGE (KITS) ×2 IMPLANT
KIT HEART LEFT (KITS) ×2 IMPLANT
MICROGUIDE FINECROSS 150 CM (MICROCATHETER) ×2
PACK CARDIAC CATHETERIZATION (CUSTOM PROCEDURE TRAY) ×2 IMPLANT
SHEATH AVANTI 11CM 7FR (SHEATH) ×2 IMPLANT
SLED PULL BACK IVUS (MISCELLANEOUS) ×2 IMPLANT
TRANSDUCER W/STOPCOCK (MISCELLANEOUS) ×2 IMPLANT
TUBING CIL FLEX 10 FLL-RA (TUBING) ×2 IMPLANT
WIRE ASAHI PROWATER 180CM (WIRE) ×6 IMPLANT
WIRE ASAHI PROWATER 300CM (WIRE) ×2 IMPLANT
WIRE COUGAR XT STRL 300CM (WIRE) ×2 IMPLANT
WIRE EMERALD 3MM-J .035X150CM (WIRE) ×2 IMPLANT
WIRE HI TORQ SPORT .014X190CM (WIRE) ×4 IMPLANT
WIRE HI TORQ SPORT .014X300CM (WIRE) ×2 IMPLANT

## 2017-07-15 NOTE — Interval H&P Note (Signed)
History and Physical Interval Note:Cath Lab Visit (complete for each Cath Lab visit)  Clinical Evaluation Leading to the Procedure:   ACS: No.  Non-ACS:    Anginal Classification: CCS II  Anti-ischemic medical therapy: Minimal Therapy (1 class of medications)  Non-Invasive Test Results: No non-invasive testing performed  Prior CABG: Previous CABG        07/15/2017 7:34 AM  Daniel Blevins  has presented today for surgery, with the diagnosis of cad  The various methods of treatment have been discussed with the patient and family. After consideration of risks, benefits and other options for treatment, the patient has consented to  Procedure(s): CORONARY STENT INTERVENTION (N/A) as a surgical intervention .  The patient's history has been reviewed, patient examined, no change in status, stable for surgery.  I have reviewed the patient's chart and labs.  Questions were answered to the patient's satisfaction.     Daniel Blevins

## 2017-07-15 NOTE — Progress Notes (Signed)
Site area: right groin  Site Prior to Removal:  Level 0  Pressure Applied For 20 MINUTES    Minutes Beginning at 1245  Manual:   Yes.    Patient Status During Pull:  Stable   Post Pull Groin Site:  Level 0  Post Pull Instructions Given:  Yes.    Post Pull Pulses Present:  Yes.    Dressing Applied:  Yes.    Comments:  Bedrest begins at 66 until 1705

## 2017-07-15 NOTE — Progress Notes (Signed)
Patient ambulating in hall with wife, slow steady gait, no s/s intolerance. Right femoral site checked before and after ambulation with no change, Site level 0, no bruising, bleeding or hematoma. Right pedal pulse +2.

## 2017-07-15 NOTE — Progress Notes (Signed)
Brilinta at 50m 2x a day for 30&90 days, there is no copay no prior auth needed Deductable was 3400. but has been met.

## 2017-07-15 NOTE — H&P (Signed)
   Long discussion with the patient concerning the procedure.  Fully informed that this particular procedure is unusual in our laboratory and my first such procedure.  Over the past 3 weeks and we have developed a well thought out game plan for management.  Initial approach will be to attempt stent graft using Graftmaster covered stent across the aneurysm.  If this fails because of technical difficulties we will resort to coiling to close the vessel as a means of preventing progression of aneurysm enlargement.  Patient understands risks.  Will use heparin after we start the intervention antiplatelet therapy will not be used unless we have a nice stent graft result.

## 2017-07-16 ENCOUNTER — Ambulatory Visit (HOSPITAL_COMMUNITY): Payer: 59

## 2017-07-16 DIAGNOSIS — I2541 Coronary artery aneurysm: Secondary | ICD-10-CM | POA: Diagnosis not present

## 2017-07-16 LAB — CBC
HEMATOCRIT: 29.1 % — AB (ref 39.0–52.0)
HEMATOCRIT: 31.2 % — AB (ref 39.0–52.0)
HEMOGLOBIN: 9.3 g/dL — AB (ref 13.0–17.0)
HEMOGLOBIN: 10 g/dL — AB (ref 13.0–17.0)
MCH: 26.3 pg (ref 26.0–34.0)
MCH: 26.5 pg (ref 26.0–34.0)
MCHC: 32 g/dL (ref 30.0–36.0)
MCHC: 32.1 g/dL (ref 30.0–36.0)
MCV: 82.4 fL (ref 78.0–100.0)
MCV: 82.5 fL (ref 78.0–100.0)
Platelets: 114 10*3/uL — ABNORMAL LOW (ref 150–400)
Platelets: 120 10*3/uL — ABNORMAL LOW (ref 150–400)
RBC: 3.53 MIL/uL — AB (ref 4.22–5.81)
RBC: 3.78 MIL/uL — ABNORMAL LOW (ref 4.22–5.81)
RDW: 15.3 % (ref 11.5–15.5)
RDW: 15.5 % (ref 11.5–15.5)
WBC: 6.3 10*3/uL (ref 4.0–10.5)
WBC: 6 10*3/uL (ref 4.0–10.5)

## 2017-07-16 LAB — BASIC METABOLIC PANEL
ANION GAP: 11 (ref 5–15)
BUN: 19 mg/dL (ref 6–20)
CHLORIDE: 108 mmol/L (ref 101–111)
CO2: 21 mmol/L — AB (ref 22–32)
CREATININE: 1.15 mg/dL (ref 0.61–1.24)
Calcium: 7.9 mg/dL — ABNORMAL LOW (ref 8.9–10.3)
GFR calc non Af Amer: >60 mL/min (ref >60)
Glucose, Bld: 85 mg/dL (ref 65–99)
POTASSIUM: 3.9 mmol/L (ref 3.5–5.1)
Sodium: 140 mmol/L (ref 135–145)

## 2017-07-16 MED ORDER — DIPHENHYDRAMINE HCL 25 MG PO CAPS
25.0000 mg | ORAL_CAPSULE | Freq: Every evening | ORAL | Status: DC | PRN
  Administered 2017-07-16: 02:00:00 25 mg via ORAL
  Filled 2017-07-16: qty 1

## 2017-07-16 NOTE — Progress Notes (Signed)
CARDIAC REHAB PHASE I   PRE:  Rate/Rhythm: 71  BP:  Sitting: 133/80     SaO2: 96% ra  MODE:  Ambulation: 800 ft   POST:  Rate/Rhythm: 85  BP:  Sitting: 146/87     SaO2: 98% ra  7:50a-8:35a Patient ambulated independently with no complaints. Education completed.  [place], MS 07/16/2017 8:30 AM

## 2017-07-16 NOTE — Progress Notes (Signed)
Subjective:  Patient underwent coronary embolization of a coronary aneurysm yesterday and tolerated it well.  Has been ambulatory in the hall.  Has had a 2-3 gram drop in hemoglobin depending on which value use.  Had some moderate back pain last night.  Feels well without any chest pain.  Objective:  Vital Signs in the last 24 hours: BP 129/81 (BP Location: Left Arm)   Pulse 65   Temp 98.3 F (36.8 C) (Oral)   Resp 14   Ht 5\' 11"  (1.803 m)   Wt 118.9 kg (262 lb 2 oz)   SpO2 96%   BMI 36.56 kg/m   Physical Exam: Pleasant obese black male in no acute distress Lungs:  Clear Cardiac:  Regular rhythm, normal S1 and S2, no S3 Extremities:  Moderate ecchymoses noted at the catheterization site particular track and medial.  No bruit noted  Intake/Output from previous day: 03/15 0701 - 03/16 0700 In: 480 [P.O.:480] Out: 750 [Urine:750]  Weight Filed Weights   07/15/17 0541 07/16/17 0345  Weight: 115.2 kg (254 lb) 118.9 kg (262 lb 2 oz)    Lab Results: Basic Metabolic Panel: Recent Labs    07/13/17 0914 07/16/17 0329  NA 143 140  K 3.9 3.9  CL 104 108  CO2 21 21*  GLUCOSE 127* 85  BUN 14 19  CREATININE 1.10 1.15   CBC: Recent Labs    07/13/17 0914 07/16/17 0329  WBC 3.3* 6.3  HGB 12.1* 9.3*  HCT 36.6* 29.1*  MCV 80 82.4  PLT 162 114*     Telemetry: Normal sinus rhythm   Assessment/Plan:  1.  Successful coil embolization of the right coronary artery aneurysm by Dr. Tamala Julian patient clinically asymptomatic 2.  Back pain questionable mechanical versus retroperitoneal hematoma 3.  Anemia may just be blood loss from procedure and in the groin but would like to exclude a rich peritoneal hematoma  Recommendations:  Discussed with Dr. Tamala Julian by text.  He would like the patient to be on aspirin.  We'll check repeat hemoglobin and noncontrast CT to exclude retroperitoneal hemorrhage.  If these are stable can go home this morning.     Kerry Hough  MD  [facility] Cardiology  07/16/2017, 8:57 AM

## 2017-07-16 NOTE — Discharge Summary (Signed)
Discharge Summary    Patient ID: Daniel Blevins,  MRN: [id number], DOB/AGE: 57-15-1962 57 y.o.  Admit date: 07/15/2017 Discharge date: 07/16/2017   Primary Care Provider: Lajean Manes Primary Cardiologist: Dr. Tamala Julian  Discharge Diagnoses    Principal Problem:   Coronary aneurysm Active Problems:   CAD (coronary artery disease) of artery bypass graft   Essential hypertension   Thrombocytopenia (HCC)   Allergies No Known Allergies   History of Present Illness     Mcihael Blevins is a 57 y.o. male with history of diffuse vascular disease including CABG in 2001 at [facility] receiving LIMA to LAD, SVG to RCA, and SVG to diagonal.  History of abdominal aortic aneurysm status post EVAR for abdominal aortic aneurysm.  Recent cath was performed after CT scan demonstrated a growing native right coronary aneurysm, estimated to be greater than 4 cm in diameter.  Angiography demonstrated total occlusion of the mid native right coronary small distal RV branch at the total occlusion and high-grade obstruction proximal to the aneurysm.  He is back today to discuss management.  He saw Dr. Pamalee Leyden for surgical consideration.  Felt that the risk was too high given prior bypass surgery and other comorbidities.  Felt appropriate management strategy should be endovascular either coiling or stent graft.  Discussed the approach with the patient in which we would consider PTCA and angioplasty with Graft Master covered stent to exclude the aneurysm from hemodynamic forces and therefore hopefully prevent future growth.  Discussed in detail with the patient the limited database on this management strategy.  Absence of personal experience with this type entity.  Wide discussion with colleagues including both vascular surgery and interventional radiology.  Initial approach will be to attempt stent graft using Graftmaster covered stent across the aneurysm.  If this fails because of technical difficulties we will  resort to coiling to close the vessel as a means of preventing progression of aneurysm enlargement.  Patient understands risks.  Will use heparin after we start the intervention antiplatelet therapy will not be used unless we have a nice stent graft result.    Hospital Course     Consultants: none  He presented to cath lab for procedure. Dr. Tamala Julian  Treated the aneurysm successfully with intracoronary embolization. The patient tolerated the procedure well. He is ambulating the halls this morning without problems.   Hb dropped today 9.3, from 12.1 on 07/13/17. He has no signs of bleeding and is not dizzy or lightheaded. Repeat Hb today was 10.0.  CT abdomen/pelvis negative for retroperitoneal bleed.   Per Dr. Tamala Julian, he was not discharged on brilinta, but was continued on 81 mg ASA for CAD. No other antiplatelet therapies.  Patient seen and examined by Dr. Wynonia Lawman today and was stable for discharge. All follow up has been arranged.  _____________  Discharge Vitals Blood pressure 129/81, pulse 65, temperature 98.3 F (36.8 C), temperature source Oral, resp. rate 14, height 5\' 11"  (1.803 m), weight 262 lb 2 oz (118.9 kg), SpO2 96 %.  Filed Weights   07/15/17 0541 07/16/17 0345  Weight: 254 lb (115.2 kg) 262 lb 2 oz (118.9 kg)    Labs & Radiologic Studies    CBC Recent Labs    07/16/17 0329 07/16/17 0854  WBC 6.3 6.0  HGB 9.3* 10.0*  HCT 29.1* 31.2*  MCV 82.4 82.5  PLT 114* 244*   Basic Metabolic Panel Recent Labs    07/16/17 0329  NA 140  K 3.9  CL  108  CO2 21*  GLUCOSE 85  BUN 19  CREATININE 1.15  CALCIUM 7.9*   Liver Function Tests No results for input(s): AST, ALT, ALKPHOS, BILITOT, PROT, ALBUMIN in the last 72 hours. No results for input(s): LIPASE, AMYLASE in the last 72 hours. Cardiac Enzymes No results for input(s): CKTOTAL, CKMB, CKMBINDEX, TROPONINI in the last 72 hours. BNP Invalid input(s): POCBNP D-Dimer No results for input(s): DDIMER in the last 72  hours. Hemoglobin A1C No results for input(s): HGBA1C in the last 72 hours. Fasting Lipid Panel No results for input(s): CHOL, HDL, LDLCALC, TRIG, CHOLHDL, LDLDIRECT in the last 72 hours. Thyroid Function Tests No results for input(s): TSH, T4TOTAL, T3FREE, THYROIDAB in the last 72 hours.  Invalid input(s): FREET3 _____________  Ct Abdomen Pelvis Wo Contrast  Result Date: 07/16/2017 CLINICAL DATA:  Asymptomatic anemia. Coronary artery aneurysm embolization yesterday. EXAM: CT ABDOMEN AND PELVIS WITHOUT CONTRAST TECHNIQUE: Multidetector CT imaging of the abdomen and pelvis was performed following the standard protocol without IV contrast. COMPARISON:  CT abdomen pelvis dated April 25, 2017. FINDINGS: Lower chest: No acute abnormality. Partially visualized right coronary artery aneurysm and embolization coils. Hepatobiliary: No focal liver abnormality. Layering contrast within the gallbladder likely related to recent coronary angiography. No gallstones or biliary dilatation. Pancreas: Unremarkable. No pancreatic ductal dilatation or surrounding inflammatory changes. Spleen: Normal in size without focal abnormality. Adrenals/Urinary Tract: The adrenal glands are unremarkable. No focal renal abnormality. Unchanged punctate bilateral renal calculi. Contrast within the renal collecting systems and bladder. No hydronephrosis. Stomach/Bowel: Prior Roux-en-Y gastric bypass surgery. No bowel wall thickening, distention, or surrounding inflammatory changes. Normal appendix. Vascular/Lymphatic: Again seen is an infrarenal abdominal aortic aneurysm status post stent grafting and interval Onyx embolization for type 2 endoleak. The aneurysm sac size is grossly unchanged, measuring up to 8.0 cm. No perianeurysmal inflammatory changes. 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 Stable to slightly increased aneurysmal dilatation of the left internal iliac artery, measuring 2.6 cm, previously  2.5 cm. No abdominal or pelvic lymphadenopathy. Reproductive: Prostate is unremarkable. Other: Unchanged small fat containing umbilical hernia. No free fluid or pneumoperitoneum. No retroperitoneal hemorrhage. Musculoskeletal: Stranding in the right groin related to recent common femoral artery access. No hematoma. No acute or significant osseous findings. IMPRESSION: 1.  No acute intra-abdominal process.  No hematoma. 2. Unchanged infrarenal abdominal aortic aneurysm status post stent grafting and interval Onyx embolization for type 2 endoleak. Aneurysm sac size is grossly unchanged, measuring up to 8.0 cm. 3. Stable to slightly increased left internal iliac artery aneurysm, measuring 2.6 cm, previously 2.5 cm. Unchanged right common iliac artery aneurysm, measuring up to 3.6 cm. 4. Unchanged punctate bilateral nonobstructive nephrolithiasis. Electronically Signed   By: Titus Dubin M.D.   On: 07/16/2017 10:15   Ir Radiologist Eval & Mgmt  Result Date: 06/22/2017 Please refer to notes tab for details about interventional procedure. (Op Note)    Diagnostic Studies/Procedures    Coronary stent intervention 07/15/17  Giant mid right coronary aneurysm treated with successful intracoronary embolization decreasing flow to 0-1 TIMI grade with expectation of thrombosis, scarring, and aneurysm contraction.  RECOMMENDATIONS:   Follow-up CT scan 6-12 months to reevaluate aneurysm size.  Overnight stay with discharge in a.m.   Disposition   Pt is being discharged home today in good condition.  Follow-up Plans & Appointments    Follow-up Information    Belva Crome, MD Follow up.   Specialty:  Cardiology Why:  1-2 weeks, office  will call Contact information: [address] [place] [address] [phone]          Discharge Instructions    Amb Referral to Cardiac Rehabilitation   Complete by:  As directed    Diagnosis:  Coronary Stents   Diet - low sodium  heart healthy   Complete by:  As directed    Discharge instructions   Complete by:  As directed    PLEASE REMEMBER TO BRING ALL OF YOUR MEDICATIONS TO EACH OF YOUR FOLLOW-UP OFFICE VISITS.  PLEASE ATTEND ALL SCHEDULED FOLLOW-UP APPOINTMENTS.   Activity: Increase activity slowly as tolerated. You may shower, but no soaking baths (or swimming) for 1 week. No driving for 24 hours. No lifting over 5 lbs for 1 week. No sexual activity for 1 week.   You May Return to Work: in 1 week (if applicable)  Wound Care: You may wash cath site gently with soap and water. Keep cath site clean and dry. If you notice pain, swelling, bleeding or pus at your cath site, please call [phone].   Increase activity slowly   Complete by:  As directed       Discharge Medications   Allergies as of 07/16/2017   No Known Allergies     Medication List    TAKE these medications   amLODipine 10 MG tablet Commonly known as:  NORVASC Take 10 mg by mouth daily.   aspirin 81 MG tablet Take 81 mg by mouth every morning.   Brexpiprazole 1 MG Tabs Take 1 mg by mouth daily.   buPROPion 300 MG 24 hr tablet Commonly known as:  WELLBUTRIN XL Take 300 mg by mouth daily.   carvedilol 25 MG tablet Commonly known as:  COREG Take 1 tablet (25 mg total) by mouth 2 (two) times daily with a meal.   lisinopril 40 MG tablet Commonly known as:  PRINIVIL,ZESTRIL Take 1 tablet (40 mg total) every morning by mouth.   rosuvastatin 40 MG tablet Commonly known as:  CRESTOR Take 1 tablet (40 mg total) by mouth daily.         Outstanding Labs/Studies   Repeat Hb at OV  Duration of Discharge Encounter   Greater than 30 minutes including physician time.  Signed, Tami Lin Alyssah Algeo PA-C 07/16/2017, 10:29 AM

## 2017-07-19 ENCOUNTER — Telehealth (HOSPITAL_COMMUNITY): Payer: Self-pay

## 2017-07-19 LAB — POCT ACTIVATED CLOTTING TIME: Activated Clotting Time: 164 seconds

## 2017-07-19 NOTE — Telephone Encounter (Signed)
Patients insurance is active and benefits verified through [facility] - No co-pay, no deductible, out of pocket amount of $7,400/$7,155.70 has been met, 20% co-insurance, and no pre-authorization is required. Passport/reference #[id number]  Patient will be contacted for scheduling upon review by the RN Navigator.

## 2017-07-21 NOTE — Progress Notes (Signed)
Cardiology Office Note    Date:  07/22/2017   ID:  Daniel Blevins, DOB [birth year], MRN [id number]  PCP:  Lajean Manes, MD  Cardiologist: Sinclair Grooms, MD   Chief Complaint  Patient presents with  . Coronary Artery Disease    Giant right coronary aneurysm    History of Present Illness:  Daniel Blevins is a 57 y.o. male for follow-up of coronary artery disease with bypass surgery 2001, history of abdominal aortic aneurysm treated with EVAR 2015, and subsequent repeat procedures for endoleak. Recent emergency room visit an overnight hospital stay for chest pain. Low risk myocardial perfusion study. Right coronary artery aneurysm embolization 07/2017.  Daniel Blevins is asymptomatic.  He has not had discomfort in his groin.  Low back discomfort has significantly improved after leaving hospital and becoming more ambulatory.  He did not have chest discomfort associated with the recent right coronary aneurysm embolization.   Past Medical History:  Diagnosis Date  . AAA (abdominal aortic aneurysm) ([place])    a. s/p EVAR 2015 with endoleak treated by IR.  Marland Kitchen AKI (acute kidney injury) ([place])   . Chest pain   . CKD (chronic kidney disease), stage II   . Coronary artery disease    a. CABG 2001 at [facility]. b. Cath 06/2017 to evaluate coronary aneurysm, workup in progress as of 06/2017.  Marland Kitchen Deafness in right ear   . Depression   . Dysrhythmia   . 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 History of kidney stones   . Hx of CABG   . Hyperlipidemia   . Hypertension   . Sleep apnea    no longer is an issue  . Urinary frequency   . Vascular anomaly    a. 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    Past Surgical History:  Procedure Laterality Date  . ABDOMINAL AORTIC ENDOVASCULAR STENT GRAFT N/A 04/30/2014   Procedure: ABDOMINAL AORTIC ENDOVASCULAR STENT GRAFT;  Surgeon:  Elam Dutch, MD;  [facility] OR;  Service: Vascular;  Laterality: N/A;  . ABDOMINAL AORTIC ENDOVASCULAR STENT GRAFT N/A 05/26/2015   Procedure: ABDOMINAL AORTIC ENDOVASCULAR STENT GRAFT RE-INTERVENTION; REPAIR OF PROXIMAL CUFF;  Surgeon: Elam Dutch, MD;  Location: [place] [facility];  Service: Vascular;  Laterality: N/A;  . BACK SURGERY  2009   lower back  . CARDIAC CATHETERIZATION    . COLONOSCOPY    . CORONARY ARTERY BYPASS GRAFT  2001  . CORONARY STENT INTERVENTION N/A 07/15/2017   Procedure: CORONARY STENT INTERVENTION;  Surgeon: Belva Crome, MD;  Location: [facility] CV LAB;  Service: Cardiovascular;  Laterality: N/A;  . CYSTOSCOPY WITH RETROGRADE PYELOGRAM, URETEROSCOPY AND STENT PLACEMENT Bilateral 02/07/2014   Procedure: CYSTOSCOPY WITH BILATERAL RETROGRADE PYELOGRAM, BILATERAL URETEROSCOPY, RIGHT EXTRACTION OF STONES AND RIGHT STENT PLACEMENT;  Surgeon: Jorja Loa, MD;  Location: [facility] ORS;  Service: Urology;  Laterality: Bilateral;  . ESOPHAGOGASTRODUODENOSCOPY    . IR AORTAGRAM ABDOMINAL SERIALOGRAM  05/20/2017  . IR CT SPINE LTD  05/20/2017  . IR EMBO ARTERIAL NOT HEMORR HEMANG INC GUIDE ROADMAPPING  05/20/2017  . IR GENERIC HISTORICAL  11/06/2015   IR RADIOLOGIST EVAL & MGMT 11/06/2015 Jacqulynn Cadet, MD [facility] INTERV RAD  . IR GENERIC HISTORICAL  04/20/2016   IR RADIOLOGIST EVAL & MGMT 04/20/2016 Jacqulynn Cadet, MD [facility] INTERV RAD  . IR RADIOLOGIST EVAL & MGMT  05/11/2017  .  IR RADIOLOGIST EVAL & MGMT  06/22/2017  . LAPAROSCOPIC GASTRIC BYPASS  2010   [facility]  . LAPAROSCOPIC LYSIS OF ADHESIONS N/A 03/06/2013   Procedure: LAPAROSCOPIC LYSIS OF ADHESIONS AND CLOSURE OF INTERNAL HERNIAS x2;  Surgeon: Adin Hector, MD;  Location: [facility];  Service: General;  Laterality: N/A;  . LAPAROSCOPY N/A 03/06/2013   Procedure: LAPAROSCOPY DIAGNOSTIC;  Surgeon: Adin Hector, MD;  Location: [facility];  Service: General;  Laterality: N/A;  . LEFT HEART CATH AND CORS/GRAFTS  ANGIOGRAPHY N/A 06/09/2017   Procedure: LEFT HEART CATH AND CORS/GRAFTS ANGIOGRAPHY;  Surgeon: Belva Crome, MD;  Location: [place] [facility];  Service: Cardiovascular;  Laterality: N/A;  . PERIPHERAL VASCULAR CATHETERIZATION Right 04/30/2014   Procedure: EMBOLIZATION right internal iliac;  Surgeon: Elam Dutch, MD;  Location: [facility];  Service: Vascular;  Laterality: Right;  . RADIOLOGY WITH ANESTHESIA N/A 05/20/2017   Procedure: Endoleak Repair;  Surgeon: Jacqulynn Cadet, MD;  Location: [place];  Service: Radiology;  Laterality: N/A;    Current Medications: Outpatient Medications Prior to Visit  Medication Sig Dispense Refill  . amLODipine (NORVASC) 10 MG tablet Take 10 mg by mouth daily.    Marland Kitchen aspirin 81 MG tablet Take 81 mg by mouth every morning.     . Brexpiprazole 1 MG TABS Take 1 mg by mouth daily.    Marland Kitchen buPROPion (WELLBUTRIN XL) 300 MG 24 hr tablet Take 300 mg by mouth daily.    . carvedilol (COREG) 25 MG tablet Take 1 tablet (25 mg total) by mouth 2 (two) times daily with a meal. 60 tablet 0  . lisinopril (PRINIVIL,ZESTRIL) 40 MG tablet Take 1 tablet (40 mg total) every morning by mouth. 90 tablet 2  . rosuvastatin (CRESTOR) 40 MG tablet Take 1 tablet (40 mg total) by mouth daily. 90 tablet 3   No facility-administered medications prior to visit.      Allergies:   Patient has no known allergies.   Social History   Socioeconomic History  . Marital status: Married    Spouse name: Not on file  . Number of children: Not on file  . Years of education: Not on file  . Highest education level: Not on file  Occupational History  . Not on file  Social Needs  . Financial resource strain: Not on file  . Food insecurity:    Worry: Not on file    Inability: Not on file  . Transportation needs:    Medical: Not on file    Non-medical: Not on file  Tobacco Use  . Smoking status: Never Smoker  . Smokeless tobacco: Never Used  Substance and Sexual Activity  . Alcohol use: Yes     Comment: occasional  . Drug use: No  . Sexual activity: Never  Lifestyle  . Physical activity:    Days per week: Not on file    Minutes per session: Not on file  . Stress: Not on file  Relationships  . Social connections:    Talks on phone: Not on file    Gets together: Not on file    Attends religious service: Not on file    Active member of club or organization: Not on file    Attends meetings of clubs or organizations: Not on file    Relationship status: Not on file  Other Topics Concern  . Not on file  Social History Narrative  . Not on file     Family History:  The  patient's family history includes AAA (abdominal aortic aneurysm) in his father; Cancer in his mother; Heart disease in his brother and father; Lung cancer in his mother.   ROS:   Please see the history of present illness.    None All other systems reviewed and are negative.   PHYSICAL EXAM:   VS:  BP 118/74   Pulse (!) 58   Ht 5\' 11"  (1.803 m)   Wt 258 lb 12.8 oz (117.4 kg)   BMI 36.10 kg/m    GEN: Well nourished, well developed, in no acute distress  HEENT: normal  Neck: no JVD, carotid bruits, or masses Cardiac: RRR; no murmurs, rubs, or gallops,no edema.  Right femoral access site is unremarkable. Respiratory:  clear to auscultation bilaterally, normal work of breathing GI: soft, nontender, nondistended, + BS MS: no deformity or atrophy  Skin: warm and dry, no rash Neuro:  Alert and Oriented x 3, Strength and sensation are intact Psych: euthymic mood, full affect  Wt Readings from Last 3 Encounters:  07/22/17 258 lb 12.8 oz (117.4 kg)  07/16/17 262 lb 2 oz (118.9 kg)  06/22/17 255 lb (115.7 kg)      Studies/Labs Reviewed:   EKG:  EKG sinus bradycardia with lateral Q waves 1 and aVL.  Recent Labs: 07/16/2017: BUN 19; Creatinine, Ser 1.15; Hemoglobin 10.0; Platelets 120; Potassium 3.9; Sodium 140   Lipid Panel    Component Value Date/Time   CHOL 204 (H) 11/25/2016 1349   TRIG 68  11/25/2016 1349   HDL 74 11/25/2016 1349   CHOLHDL 2.8 11/25/2016 1349   VLDL 14 11/25/2016 1349   LDLCALC 116 (H) 11/25/2016 1349    Additional studies/ records that were reviewed today include:  Giant right coronary embolization procedure 07/15/17:  Conclusion    Giant mid right coronary aneurysm treated with successful intracoronary embolization decreasing flow to 0-1 TIMI grade with expectation of thrombosis, scarring, and aneurysm contraction.   RECOMMENDATIONS:    Follow-up CT scan 6-12 months to reevaluate aneurysm size.  Overnight stay with discharge in a.m.     ASSESSMENT:    1. Coronary aneurysm   2. Coronary artery disease involving coronary bypass graft of native heart without angina pectoris   3. Essential hypertension   4. Mixed hyperlipidemia      PLAN:  In order of problems listed above:  1. Stable without ischemic symptoms, access site bleeding, or other problems.  Plan is for follow-up chest CT in 6-12 months to assess the size of the mid right coronary aneurysm post embolization. 2. We discussed the small aneurysm in the left anterior descending.  I had also discussed this with Dr. Cyndia Bent.  Both he and I feel that the LAD aneurysm can be clinically followed. 3. Excellent control with target 130/80 mmHg. 4. LDL target less than or equal to 70.    Medication Adjustments/Labs and Tests Ordered: Current medicines are reviewed at length with the patient today.  Concerns regarding medicines are outlined above.  Medication changes, Labs and Tests ordered today are listed in the Patient Instructions below. Patient Instructions  Medication Instructions:  Your physician recommends that you continue on your current medications as directed. Please refer to the Current Medication list given to you today.  Labwork: You will need a BMET just prior to your CT scan.   Testing/Procedures: Your physician recommends that you have a Chest CT 2-4 weeks prior to seeing  him back.  Follow-Up: Your physician wants you to follow-up in: 10  months with Dr. Tamala Julian.  You will receive a reminder letter in the mail two months in advance. If you don't receive a letter, please call our office to schedule the follow-up appointment.   Any Other Special Instructions Will Be Listed Below (If Applicable).     If you need a refill on your cardiac medications before your next appointment, please call your pharmacy.      Signed, Sinclair Grooms, MD  07/22/2017 11:30 AM    [facility] [place], [place],   [address] Phone: [phone]; Fax: [phone]

## 2017-07-22 ENCOUNTER — Ambulatory Visit (INDEPENDENT_AMBULATORY_CARE_PROVIDER_SITE_OTHER): Payer: 59 | Admitting: Interventional Cardiology

## 2017-07-22 ENCOUNTER — Encounter: Payer: Self-pay | Admitting: Interventional Cardiology

## 2017-07-22 VITALS — BP 118/74 | HR 58 | Ht 71.0 in | Wt 258.8 lb

## 2017-07-22 DIAGNOSIS — I25810 Atherosclerosis of coronary artery bypass graft(s) without angina pectoris: Secondary | ICD-10-CM

## 2017-07-22 DIAGNOSIS — E782 Mixed hyperlipidemia: Secondary | ICD-10-CM | POA: Diagnosis not present

## 2017-07-22 DIAGNOSIS — I2541 Coronary artery aneurysm: Secondary | ICD-10-CM | POA: Diagnosis not present

## 2017-07-22 DIAGNOSIS — I10 Essential (primary) hypertension: Secondary | ICD-10-CM

## 2017-07-22 NOTE — Patient Instructions (Signed)
Medication Instructions:  Your physician recommends that you continue on your current medications as directed. Please refer to the Current Medication list given to you today.  Labwork: You will need a BMET just prior to your CT scan.   Testing/Procedures: Your physician recommends that you have a Chest CT 2-4 weeks prior to seeing him back.  Follow-Up: Your physician wants you to follow-up in: 10 months with Dr. Tamala Julian.  You will receive a reminder letter in the mail two months in advance. If you don't receive a letter, please call our office to schedule the follow-up appointment.   Any Other Special Instructions Will Be Listed Below (If Applicable).     If you need a refill on your cardiac medications before your next appointment, please call your pharmacy.

## 2017-07-25 ENCOUNTER — Telehealth (HOSPITAL_COMMUNITY): Payer: Self-pay

## 2017-07-25 NOTE — Telephone Encounter (Signed)
Called and spoke with patient in regards to Cardiac Rehab - Patient stated that he just a started a new job 3 weeks ago and the times will not work for him. Patient stated "this program will not work for me as I have bills to pay like you do". Closed referral.

## 2017-08-05 ENCOUNTER — Other Ambulatory Visit: Payer: 59

## 2017-08-08 ENCOUNTER — Other Ambulatory Visit: Payer: 59 | Admitting: *Deleted

## 2017-08-08 DIAGNOSIS — I25700 Atherosclerosis of coronary artery bypass graft(s), unspecified, with unstable angina pectoris: Secondary | ICD-10-CM

## 2017-08-08 LAB — HEPATIC FUNCTION PANEL
ALK PHOS: 53 IU/L (ref 39–117)
ALT: 21 IU/L (ref 0–44)
AST: 25 IU/L (ref 0–40)
Albumin: 4.3 g/dL (ref 3.5–5.5)
BILIRUBIN, DIRECT: 0.09 mg/dL (ref 0.00–0.40)
Bilirubin Total: <0.2 mg/dL (ref 0.0–1.2)
Total Protein: 7 g/dL (ref 6.0–8.5)

## 2017-08-08 LAB — LIPID PANEL
CHOL/HDL RATIO: 2.8 ratio (ref 0.0–5.0)
Cholesterol, Total: 156 mg/dL (ref 100–199)
HDL: 56 mg/dL (ref >39)
LDL Calculated: 84 mg/dL (ref 0–99)
TRIGLYCERIDES: 82 mg/dL (ref 0–149)
VLDL Cholesterol Cal: 16 mg/dL (ref 5–40)

## 2017-10-05 ENCOUNTER — Other Ambulatory Visit: Payer: Self-pay | Admitting: *Deleted

## 2017-10-05 MED ORDER — ROSUVASTATIN CALCIUM 40 MG PO TABS: 40.0000 mg | ORAL_TABLET | Freq: Every day | ORAL | 2 refills | Status: DC

## 2017-11-24 ENCOUNTER — Other Ambulatory Visit: Payer: Self-pay | Admitting: Radiology

## 2017-11-24 ENCOUNTER — Encounter: Payer: Self-pay | Admitting: Radiology

## 2017-11-24 ENCOUNTER — Other Ambulatory Visit (HOSPITAL_COMMUNITY): Payer: Self-pay | Admitting: Interventional Radiology

## 2017-11-24 DIAGNOSIS — IMO0001 Reserved for inherently not codable concepts without codable children: Secondary | ICD-10-CM

## 2017-11-24 DIAGNOSIS — T82330D Leakage of aortic (bifurcation) graft (replacement), subsequent encounter: Secondary | ICD-10-CM

## 2017-12-08 ENCOUNTER — Other Ambulatory Visit: Payer: Self-pay | Admitting: Radiology

## 2017-12-08 ENCOUNTER — Encounter: Payer: Self-pay | Admitting: Radiology

## 2017-12-08 ENCOUNTER — Ambulatory Visit
Admission: RE | Admit: 2017-12-08 | Discharge: 2017-12-08 | Disposition: A | Discharge status: Home / Self Care | Payer: 59 | Source: Ambulatory Visit | Attending: Interventional Radiology | Admitting: Interventional Radiology

## 2017-12-08 ENCOUNTER — Ambulatory Visit (HOSPITAL_COMMUNITY)
Admission: RE | Admit: 2017-12-08 | Discharge: 2017-12-08 | Disposition: A | Discharge status: Home / Self Care | Payer: 59 | Source: Ambulatory Visit | Attending: Interventional Radiology | Admitting: Interventional Radiology

## 2017-12-08 DIAGNOSIS — Z9889 Other specified postprocedural states: Secondary | ICD-10-CM | POA: Insufficient documentation

## 2017-12-08 DIAGNOSIS — C787 Secondary malignant neoplasm of liver and intrahepatic bile duct: Secondary | ICD-10-CM

## 2017-12-08 DIAGNOSIS — I714 Abdominal aortic aneurysm, without rupture: Secondary | ICD-10-CM | POA: Insufficient documentation

## 2017-12-08 DIAGNOSIS — T82330D Leakage of aortic (bifurcation) graft (replacement), subsequent encounter: Secondary | ICD-10-CM

## 2017-12-08 DIAGNOSIS — Z8679 Personal history of other diseases of the circulatory system: Secondary | ICD-10-CM | POA: Insufficient documentation

## 2017-12-08 DIAGNOSIS — I723 Aneurysm of iliac artery: Secondary | ICD-10-CM | POA: Diagnosis not present

## 2017-12-08 DIAGNOSIS — IMO0001 Reserved for inherently not codable concepts without codable children: Secondary | ICD-10-CM

## 2017-12-08 DIAGNOSIS — C801 Malignant (primary) neoplasm, unspecified: Principal | ICD-10-CM

## 2017-12-08 DIAGNOSIS — I2541 Coronary artery aneurysm: Secondary | ICD-10-CM | POA: Diagnosis not present

## 2017-12-08 HISTORY — PX: IR RADIOLOGIST EVAL & MGMT: IMG5224

## 2017-12-08 MED ORDER — IOPAMIDOL (ISOVUE-370) INJECTION 76%
100.0000 mL | Freq: Once | INTRAVENOUS | Status: AC | PRN
  Administered 2017-12-08: 100 mL via INTRAVENOUS

## 2017-12-08 MED ORDER — IOPAMIDOL (ISOVUE-370) INJECTION 76%
INTRAVENOUS | Status: AC
  Filled 2017-12-08: qty 100

## 2017-12-08 NOTE — Progress Notes (Signed)
Chief Complaint: Patient was seen in consultation today for  Chief Complaint  Patient presents with  . Follow-up    7 mo follow up Type 2 Endoleak Repair     at the request of Daniel Blevins  Referring Physician(s): [name], MD  History of Present Illness: Daniel Blevins is a 57 y.o. male who underwent AAA repair with Dr. Oneida Alar in 2015.  He subsequently developed an endoleak and was referred to interventional radiology for management.  He underwent sucessful embolization back in 2017, however developed abdominal pain with associated CT findings demonstrating further progression of his endoleak.  He subsequently underwent direct stick endoleak repair on 05/20/17.  Patient tolerated the procedure well and was able to discharge home the same day.   He presents today for follow-up evaluation.  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  This procedure went well and his postoperative course was uncomplicated.  He continues to be in relatively excellent health and currently has no clinical symptoms.  His lower extremity claudication has also resolved.  His CT arteriogram today demonstrates all good news.  His right coronary artery aneurysm appears well embolized and the aneurysm measures slightly smaller.  I detect no evidence of residual or recurrent type II endoleak and his abdominal aortic aneurysm sac remains stable in size.  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  He denies chest pain, shortness of breath or other systemic symptoms.  Past Medical History:  Diagnosis Date  . AAA (abdominal aortic aneurysm) ([place])    a. s/p EVAR 2015 with endoleak treated by IR.  Marland Kitchen AKI (acute kidney injury) ([place])   . Chest pain   . CKD (chronic kidney disease), stage II   . Coronary artery disease    a. CABG 2001 at [facility]. b.  Cath 06/2017 to evaluate coronary aneurysm, workup in progress as of 06/2017.  Marland Kitchen Deafness in right ear   . Depression   . Dysrhythmia   . 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 History of kidney stones   . Hx of CABG   . Hyperlipidemia   . Hypertension   . Sleep apnea    no longer is an issue  . Urinary frequency   . Vascular anomaly    a. 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    Past Surgical History:  Procedure Laterality Date  . ABDOMINAL AORTIC ENDOVASCULAR STENT GRAFT N/A 04/30/2014   Procedure: ABDOMINAL AORTIC ENDOVASCULAR STENT GRAFT;  Surgeon: Elam Dutch, MD;  [facility] OR;  Service: Vascular;  Laterality: N/A;  . ABDOMINAL AORTIC ENDOVASCULAR STENT GRAFT N/A 05/26/2015   Procedure: ABDOMINAL AORTIC ENDOVASCULAR STENT GRAFT RE-INTERVENTION; REPAIR OF PROXIMAL CUFF;  Surgeon: Elam Dutch, MD;  Location: [place];  Service: Vascular;  Laterality: N/A;  . BACK SURGERY  2009   lower back  . CARDIAC CATHETERIZATION    . COLONOSCOPY    . CORONARY ARTERY BYPASS GRAFT  2001  . CORONARY STENT INTERVENTION N/A 07/15/2017   Procedure: CORONARY STENT INTERVENTION;  Surgeon: Belva Crome, MD;  Location: [place] [facility];  Service: Cardiovascular;  Laterality: N/A;  . CYSTOSCOPY WITH RETROGRADE PYELOGRAM, URETEROSCOPY AND STENT PLACEMENT Bilateral 02/07/2014   Procedure: CYSTOSCOPY WITH BILATERAL RETROGRADE PYELOGRAM, BILATERAL URETEROSCOPY, RIGHT EXTRACTION OF  STONES AND RIGHT STENT PLACEMENT;  Surgeon: Jorja Loa, MD;  Location: [facility];  Service: Urology;  Laterality: Bilateral;  . ESOPHAGOGASTRODUODENOSCOPY    . IR AORTAGRAM ABDOMINAL SERIALOGRAM  05/20/2017  . IR CT SPINE LTD  05/20/2017  . IR EMBO ARTERIAL NOT HEMORR HEMANG INC GUIDE ROADMAPPING  05/20/2017  . IR GENERIC HISTORICAL  11/06/2015   IR RADIOLOGIST EVAL & MGMT 11/06/2015  Jacqulynn Cadet, MD [facility] INTERV RAD  . IR GENERIC HISTORICAL  04/20/2016   IR RADIOLOGIST EVAL & MGMT 04/20/2016 Jacqulynn Cadet, MD [facility] INTERV RAD  . IR RADIOLOGIST EVAL & MGMT  05/11/2017  . IR RADIOLOGIST EVAL & MGMT  06/22/2017  . IR RADIOLOGIST EVAL & MGMT  12/08/2017  . LAPAROSCOPIC GASTRIC BYPASS  2010   [facility]  . LAPAROSCOPIC LYSIS OF ADHESIONS N/A 03/06/2013   Procedure: LAPAROSCOPIC LYSIS OF ADHESIONS AND CLOSURE OF INTERNAL HERNIAS x2;  Surgeon: Adin Hector, MD;  Location: [facility];  Service: General;  Laterality: N/A;  . LAPAROSCOPY N/A 03/06/2013   Procedure: LAPAROSCOPY DIAGNOSTIC;  Surgeon: Adin Hector, MD;  Location: [facility];  Service: General;  Laterality: N/A;  . LEFT HEART CATH AND CORS/GRAFTS ANGIOGRAPHY N/A 06/09/2017   Procedure: LEFT HEART CATH AND CORS/GRAFTS ANGIOGRAPHY;  Surgeon: Belva Crome, MD;  Location: [place] [facility];  Service: Cardiovascular;  Laterality: N/A;  . PERIPHERAL VASCULAR CATHETERIZATION Right 04/30/2014   Procedure: EMBOLIZATION right internal iliac;  Surgeon: Elam Dutch, MD;  Location: [place];  Service: Vascular;  Laterality: Right;  . RADIOLOGY WITH ANESTHESIA N/A 05/20/2017   Procedure: Endoleak Repair;  Surgeon: Jacqulynn Cadet, MD;  Location: [place];  Service: Radiology;  Laterality: N/A;    Allergies: Patient has no known allergies.  Medications: Prior to Admission medications   Medication Sig Start Date End Date Taking? Authorizing Provider  amLODipine (NORVASC) 10 MG tablet Take 10 mg by mouth daily.   Yes [provider]  aspirin 81 MG tablet Take 81 mg by mouth every morning.    Yes [provider]  Brexpiprazole 1 MG TABS Take 1 mg by mouth daily.   Yes [provider]  buPROPion (WELLBUTRIN XL) 300 MG 24 hr tablet Take 300 mg by mouth daily. 12/12/15  Yes [provider]  carvedilol (COREG) 25 MG tablet Take 1 tablet (25 mg total) by mouth 2 (two) times daily with a meal.  11/27/16  Yes Hongalgi, Lenis Dickinson, MD  escitalopram (LEXAPRO) 10 MG tablet Take 10 mg by mouth daily.   Yes [provider]  lisinopril (PRINIVIL,ZESTRIL) 40 MG tablet Take 1 tablet (40 mg total) every morning by mouth. 03/09/17  Yes Belva Crome, MD  rosuvastatin (CRESTOR) 40 MG tablet Take 1 tablet (40 mg total) by mouth daily. 10/05/17  Yes Belva Crome, MD     Family History  Problem Relation Age of Onset  . Lung cancer Mother   . Cancer Mother        Lung  . AAA (abdominal aortic aneurysm) Father   . Heart disease Father        before age 76  . Heart disease Brother        before age 43    Social History   Socioeconomic History  . Marital status: Married    Spouse name: Not on file  . Number of children: Not on file  . Years of education: Not on file  . Highest education level: Not on file  Occupational History  . Not on file  Social Needs  . Financial resource strain: Not on file  . Food insecurity:    Worry: Not on file    Inability: Not on file  . Transportation needs:    Medical: Not on file    Non-medical: Not on file  Tobacco Use  . Smoking status: Never Smoker  . Smokeless tobacco: Never Used  Substance and Sexual Activity  . Alcohol use: Yes    Comment: occasional  . Drug use: No  . Sexual activity: Never  Lifestyle  . Physical activity:    Days per week: Not on file    Minutes per session: Not on file  . Stress: Not on file  Relationships  . Social connections:    Talks on phone: Not on file    Gets together: Not on file    Attends religious service: Not on file    Active member of club or organization: Not on file    Attends meetings of clubs or organizations: Not on file    Relationship status: Not on file  Other Topics Concern  . Not on file  Social History Narrative  . Not on file     Review of Systems: A 12 point ROS discussed and pertinent positives are indicated in the HPI above.  All other systems are negative.  Review of  Systems  Vital Signs: BP 100/70   Pulse (!) 56   Temp 98.2 F (36.8 C) (Oral)   Resp 16   Ht 5\' 11"  (1.803 m)   Wt 116.1 kg   SpO2 99%   BMI 35.70 kg/m   Physical Exam  Constitutional: He is oriented to person, place, and time. He appears well-developed and well-nourished. No distress.  HENT:  Head: Normocephalic and atraumatic.  Eyes: No scleral icterus.  Cardiovascular: Normal rate.  Pulmonary/Chest: Effort normal.  Abdominal: Soft. He exhibits no distension. There is no tenderness.  Neurological: He is alert and oriented to person, place, and time.  Skin: Skin is warm and dry.  Psychiatric: He has a normal mood and affect. His behavior is normal.  Nursing note and vitals reviewed.    Imaging: Ct Angio Ao+bifem W & Or Wo Contrast  Result Date: 12/08/2017 CLINICAL DATA:  57 year old male with a history of abdominal aortic aneurysm status post endovascular aortic repair complicated by the presence of a type 2 endoleak now status post endovascular endoleak repair x2, most recently 05/20/2017. EXAM: CT ANGIOGRAPHY AOBIFEM WITHOUT AND WITH CONTRAST TECHNIQUE: Multidetector CT imaging of the abdomen and pelvis was performed using the standard protocol during bolus administration of intravenous contrast. Multiplanar reconstructed images and MIPs were obtained and reviewed to evaluate the vascular anatomy. CONTRAST:  137mL ISOVUE-370 IOPAMIDOL (ISOVUE-370) INJECTION 76% COMPARISON:  Most recent prior CT scan of the abdomen and pelvis 07/16/2017; 04/25/2017 FINDINGS: VASCULAR Aorta: Fusiform abdominal aortic aneurysm status post endovascular aortic repair with a bifurcated endoprosthesis extending from just beyond the renal arteries into the right external iliac artery, and the left internal iliac artery. Patient has undergone prior coil embolization of the right internal iliac artery. The aneurysmal aorta is essentially unchanged at 7.8 x 8.4 cm. Although evaluation for residual type 2  endoleak is somewhat limited given the degree of streak artifact from prior liquid embolic, no definite residual endoleak can be identified. Celiac: Patent without evidence of aneurysm, dissection, vasculitis or significant stenosis. SMA: Patent without evidence of aneurysm, dissection, vasculitis or significant stenosis. Renals: Both renal arteries  are patent without evidence of aneurysm, dissection, vasculitis, fibromuscular dysplasia or significant stenosis. IMA: Occluded at the origin. Distal branches reconstitute via collateral flow from the SMA. Inflow: Stable aneurysmal dilatation of the right common iliac artery which is been repaired via the stent graft. The excluded aneurysm measures up to 3.7 cm in diameter. The right internal iliac artery has been coil embolized. Stable aneurysmal dilatation of the left internal iliac artery with a maximal diameter of 2.4 cm. The left gluteal aneurysms also appear essentially unchanged although not particularly well opacified on today's examination. Proximal Outflow: Bilateral common femoral and visualized portions of the superficial and profunda femoral arteries are patent without evidence of aneurysm, dissection, vasculitis or significant stenosis. Veins: No obvious venous abnormality within the limitations of this arterial phase study. Review of the MIP images confirms the above findings. NON-VASCULAR Lower chest: Interval coil embolization of the native right coronary artery aneurysm. The thrombosed aneurysm sac measures 4.0 x 3.2 cm, improved compared to prior imaging. The remainder of the heart is normal in size. No pericardial effusion. The lower lungs are clear. Hepatobiliary: Normal hepatic contour and morphology. No discrete hepatic lesions. Normal appearance of the gallbladder. No intra or extrahepatic biliary ductal dilatation. Pancreas: Unremarkable. No pancreatic ductal dilatation or surrounding inflammatory changes. Spleen: Normal in size without focal  abnormality. Adrenals/Urinary Tract: Adrenal glands are unremarkable. Kidneys are normal, without renal calculi, focal lesion, or hydronephrosis. Bladder is unremarkable. Stomach/Bowel: Surgical changes of prior gastric bypass surgery. No evidence of obstruction or focal bowel wall thickening. Lymphatic: No suspicious lymphadenopathy. Reproductive: Prostate is unremarkable. Other: Small fat containing umbilical hernia. No abdominopelvic ascites. Musculoskeletal: No acute fracture or aggressive appearing lytic or blastic osseous lesion. IMPRESSION: VASCULAR 1. Successful endoleak repair. Stable size of the infrarenal abdominal aortic aneurysm status post EVAR and endoleak repair x2. No evidence of residual type 2 endoleak. 2. 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 3. Stable left internal iliac artery aneurysm. 4. Interval coil embolization of the native right coronary artery aneurysm with an interval decrease in size of the aneurysm diameter. 5. Grossly stable left superior gluteal aneurysm. NON-VASCULAR 1. Ancillary findings as above without significant interval change. Signed, Criselda Peaches, MD [facility] Radiology Electronically Signed   By: Jacqulynn Cadet M.D.   On: 12/08/2017 13:20   Ir Radiologist Eval & Mgmt  Result Date: 12/08/2017 Please refer to notes tab for details about interventional procedure. (Op Note)   Labs:  CBC: Recent Labs    06/30/17 0958 07/13/17 0914 07/16/17 0329 07/16/17 0854  WBC 4.0 3.3* 6.3 6.0  HGB 11.4* 12.1* 9.3* 10.0*  HCT 35.2* 36.6* 29.1* 31.2*  PLT 154 162 114* 120*    COAGS: Recent Labs    05/20/17 0607 06/03/17 1608 06/30/17 0958 07/13/17 0914  INR 1.10 1.0 1.1 1.1  APTT 28  --   --   --     BMP: Recent Labs    06/03/17 1608 06/30/17 0958 07/13/17 0914 07/16/17 0329  NA 142 140 143 140  K 4.2 4.0 3.9 3.9  CL 102 104 104 108  CO2 22 20 21  21*  GLUCOSE 92 103* 127* 85    BUN 19 16 14 19   CALCIUM 8.9 8.5* 8.5* 7.9*  CREATININE 1.22 1.12 1.10 1.15  GFRNONAA 66 73 75 >60  GFRAA 76 84 86 >60    LIVER FUNCTION TESTS: Recent Labs    08/08/17 0741  BILITOT <0.2  AST 25  ALT  21  ALKPHOS 53  PROT 7.0  ALBUMIN 4.3    TUMOR MARKERS: No results for input(s): AFPTM, CEA, CA199, CHROMGRNA in the last 8760 hours.  Assessment and Plan:  As usual, Mr. Sauseda is doing exceptionally well clinically.  He has no complaints.  His follow-up CT arteriogram today demonstrated no evidence of residual or recurrent type II endoleak.  His excluded aneurysm sac remains stable in size.  I can also see that he has undergone coil embolization of his native right coronary artery aneurysm and at that aneurysm is measuring smaller.  No significant interval change in his left internal iliac or left superior gluteal aneurysm  1.)  Return clinic visit with repeat CTA of the abdomen and pelvis in 1 year.   Electronically Signed: Jacqulynn Cadet 12/08/2017, 2:43 PM   I spent a total of 15 Minutes in face to face in clinical consultation, greater than 50% of which was counseling/coordinating care for abdominal aortic aneurysm complicated by type II endoleak status post endoleak repair x2.

## 2017-12-26 ENCOUNTER — Other Ambulatory Visit: Payer: Self-pay | Admitting: Interventional Cardiology

## 2017-12-26 ENCOUNTER — Encounter (INDEPENDENT_AMBULATORY_CARE_PROVIDER_SITE_OTHER): Payer: Self-pay | Admitting: Physician Assistant

## 2017-12-26 ENCOUNTER — Ambulatory Visit (INDEPENDENT_AMBULATORY_CARE_PROVIDER_SITE_OTHER): Payer: 59 | Admitting: Physician Assistant

## 2017-12-26 DIAGNOSIS — I25701 Atherosclerosis of coronary artery bypass graft(s), unspecified, with angina pectoris with documented spasm: Secondary | ICD-10-CM

## 2017-12-26 DIAGNOSIS — I10 Essential (primary) hypertension: Secondary | ICD-10-CM

## 2017-12-26 DIAGNOSIS — M7541 Impingement syndrome of right shoulder: Secondary | ICD-10-CM | POA: Diagnosis not present

## 2017-12-26 DIAGNOSIS — R55 Syncope and collapse: Secondary | ICD-10-CM

## 2017-12-26 DIAGNOSIS — IMO0001 Reserved for inherently not codable concepts without codable children: Secondary | ICD-10-CM

## 2017-12-26 DIAGNOSIS — E785 Hyperlipidemia, unspecified: Secondary | ICD-10-CM

## 2017-12-26 DIAGNOSIS — T82330A Leakage of aortic (bifurcation) graft (replacement), initial encounter: Secondary | ICD-10-CM

## 2017-12-26 MED ORDER — LIDOCAINE HCL 1 % IJ SOLN
3.0000 mL | INTRAMUSCULAR | Status: AC | PRN
  Administered 2017-12-26: 3 mL

## 2017-12-26 MED ORDER — METHYLPREDNISOLONE ACETATE 40 MG/ML IJ SUSP
40.0000 mg | INTRAMUSCULAR | Status: AC | PRN
  Administered 2017-12-26: 40 mg via INTRA_ARTICULAR

## 2017-12-26 MED ORDER — LISINOPRIL 40 MG PO TABS: 40.0000 mg | ORAL_TABLET | Freq: Every morning | ORAL | 2 refills | Status: DC

## 2017-12-26 NOTE — Progress Notes (Signed)
   Procedure Note  Patient: Daniel Blevins             Date of Birth: 1961-04-11           MRN: [id number]             Visit Date: 12/26/2017 HPI: Mr. Platte is well-known to Dr. Ninfa Linden service's been approximately 2 years since he was last seen was given subacromial injection for his right shoulder impingement.  He has had no injury to his shoulder.  He states that he woke up this past Saturday with increased right shoulder pain and decreased range of motion.  He denies any numbness tingling down the arm.  Denies any neck pain.  He has used some of his wife's pain medicine and Advil without much relief.  Review of systems: No fevers chills.  Please see HPI otherwise negative  Physical exam: Right shoulder positive impingement testing.  No weakness with external and internal rotation against resistance bilateral shoulders.  Is active forward flexion to 10 approximately 75 degrees passively I can bring him to 160 degrees.  Biceps bilaterally without deformity.  He has tenderness in the bicipital groove right shoulder.  Good range of motion of the cervical spine without pain.  Procedures: Visit Diagnoses: Impingement syndrome of right shoulder  Large Joint Inj: R subacromial bursa on 12/26/2017 5:25 PM Indications: pain Details: 22 G 1.5 in needle, lateral approach  Arthrogram: No  Medications: 3 mL lidocaine 1 %; 40 mg methylPREDNISolone acetate 40 MG/ML Outcome: tolerated well, no immediate complications Procedure, treatment alternatives, risks and benefits explained, specific risks discussed. Consent was given by the patient. Immediately prior to procedure a time out was called to verify the correct patient, procedure, equipment, support staff and site/side marked as required. Patient was prepped and draped in the usual sterile fashion.     Plan: He is shown wall crawls, pendulum, Codman and forward flexion exercises.  We will see him back in 2 weeks check his response to the injection.   Questions were encouraged and answered.

## 2018-01-11 ENCOUNTER — Ambulatory Visit (INDEPENDENT_AMBULATORY_CARE_PROVIDER_SITE_OTHER): Payer: 59 | Admitting: Physician Assistant

## 2018-02-07 ENCOUNTER — Encounter: Payer: Self-pay | Admitting: Interventional Cardiology

## 2018-04-10 ENCOUNTER — Encounter: Payer: Self-pay | Admitting: *Deleted

## 2018-04-10 ENCOUNTER — Ambulatory Visit (INDEPENDENT_AMBULATORY_CARE_PROVIDER_SITE_OTHER)
Admission: RE | Admit: 2018-04-10 | Discharge: 2018-04-10 | Disposition: A | Discharge status: Home / Self Care | Payer: 59 | Source: Ambulatory Visit | Attending: Interventional Cardiology | Admitting: Interventional Cardiology

## 2018-04-10 ENCOUNTER — Telehealth: Payer: Self-pay | Admitting: *Deleted

## 2018-04-10 DIAGNOSIS — I2541 Coronary artery aneurysm: Secondary | ICD-10-CM | POA: Diagnosis not present

## 2018-04-10 DIAGNOSIS — I712 Thoracic aortic aneurysm, without rupture, unspecified: Secondary | ICD-10-CM

## 2018-04-10 MED ORDER — IOPAMIDOL (ISOVUE-370) INJECTION 76%
100.0000 mL | Freq: Once | INTRAVENOUS | Status: AC | PRN
  Administered 2018-04-10: 100 mL via INTRAVENOUS

## 2018-04-10 NOTE — Telephone Encounter (Signed)
Left message to go over CT results and recommendations.

## 2018-04-10 NOTE — Telephone Encounter (Signed)
-----   Message from Belva Crome, MD sent at 04/10/2018  2:49 PM EST ----- The coronary aneurysm has gotten smaller.  Will discuss at follow-up.  Will need CT repeated in 1 year.

## 2018-04-10 NOTE — Telephone Encounter (Signed)
This encounter was created in error - please disregard.

## 2018-04-10 NOTE — Telephone Encounter (Signed)
Pt called back and has been notified of CT results by phone with verbal understanding. Pt thanked me for the call. Pt advised to keep appt with Dr. Tamala Julian 05/08/18 and will discuss CT further at that time. Repeat CT in 1 year. Pt is agreeable to plan of care. ------

## 2018-04-10 NOTE — Addendum Note (Signed)
Addended by: Michae Kava on: 04/10/2018 03:32 PM   Modules accepted: Orders

## 2018-04-21 ENCOUNTER — Ambulatory Visit: Payer: 59 | Admitting: Interventional Cardiology

## 2018-05-07 NOTE — Progress Notes (Deleted)
Cardiology Office Note:    Date:  05/07/2018   ID:  Daniel Blevins, DOB Aug 28, 1960, MRN [id number]  PCP:  Lajean Manes, MD  Cardiologist:  Sinclair Grooms, MD   Referring MD: Lajean Manes, MD   No chief complaint on file.   History of Present Illness:    Daniel Blevins is a 58 y.o. male with a hx of coronary artery disease with bypass surgery 2001, history of abdominal aortic aneurysm treated with [id number], and subsequent repeat procedures for endoleak.Recent emergency room visit an overnight hospital stay for chest pain. Low risk myocardial perfusion study. Right coronary artery aneurysm embolization 07/2017.  Past Medical History:  Diagnosis Date  . AAA (abdominal aortic aneurysm) ([place])    a. s/p EVAR 2015 with endoleak treated by IR.  Marland Kitchen AKI (acute kidney injury) ([place])   . Chest pain   . CKD (chronic kidney disease), stage II   . Coronary artery disease    a. CABG 2001 at [facility]. b. Cath 06/2017 to evaluate coronary aneurysm, workup in progress as of 06/2017.  Marland Kitchen Deafness in right ear   . Depression   . Dysrhythmia   . 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 History of kidney stones   . Hx of CABG   . Hyperlipidemia   . Hypertension   . Sleep apnea    no longer is an issue  . Urinary frequency   . Vascular anomaly    a. 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    Past Surgical History:  Procedure Laterality Date  . ABDOMINAL AORTIC ENDOVASCULAR STENT GRAFT N/A 04/30/2014   Procedure: ABDOMINAL AORTIC ENDOVASCULAR STENT GRAFT;  Surgeon: Elam Dutch, MD;  Location: [facility] OR;  Service: Vascular;  Laterality: N/A;  . ABDOMINAL AORTIC ENDOVASCULAR STENT GRAFT N/A 05/26/2015   Procedure: ABDOMINAL AORTIC ENDOVASCULAR STENT GRAFT RE-INTERVENTION; REPAIR OF PROXIMAL CUFF;  Surgeon: Elam Dutch, MD;  Location: [place];  Service: Vascular;   Laterality: N/A;  . BACK SURGERY  2009   lower back  . CARDIAC CATHETERIZATION    . COLONOSCOPY    . CORONARY ARTERY BYPASS GRAFT  2001  . CORONARY STENT INTERVENTION N/A 07/15/2017   Procedure: CORONARY STENT INTERVENTION;  Surgeon: Belva Crome, MD;  Location: [place] [facility];  Service: Cardiovascular;  Laterality: N/A;  . CYSTOSCOPY WITH RETROGRADE PYELOGRAM, URETEROSCOPY AND STENT PLACEMENT Bilateral 02/07/2014   Procedure: CYSTOSCOPY WITH BILATERAL RETROGRADE PYELOGRAM, BILATERAL URETEROSCOPY, RIGHT EXTRACTION OF STONES AND RIGHT STENT PLACEMENT;  Surgeon: Jorja Loa, MD;  [facility];  Service: Urology;  Laterality: Bilateral;  . ESOPHAGOGASTRODUODENOSCOPY    . IR AORTAGRAM ABDOMINAL SERIALOGRAM  05/20/2017  . IR CT SPINE LTD  05/20/2017  . IR EMBO ARTERIAL NOT HEMORR HEMANG INC GUIDE ROADMAPPING  05/20/2017  . IR GENERIC HISTORICAL  11/06/2015   IR RADIOLOGIST EVAL & MGMT 11/06/2015 Jacqulynn Cadet, MD [facility] INTERV RAD  . IR GENERIC HISTORICAL  04/20/2016   IR RADIOLOGIST EVAL & MGMT 04/20/2016 Jacqulynn Cadet, MD [facility] INTERV RAD  . IR RADIOLOGIST EVAL & MGMT  05/11/2017  . IR RADIOLOGIST EVAL & MGMT  06/22/2017  . IR RADIOLOGIST EVAL & MGMT  12/08/2017  . LAPAROSCOPIC GASTRIC BYPASS  2010   [facility]  . LAPAROSCOPIC LYSIS OF ADHESIONS N/A 03/06/2013   Procedure: LAPAROSCOPIC LYSIS OF ADHESIONS AND CLOSURE OF INTERNAL HERNIAS  x2;  Surgeon: Adin Hector, MD;  [facility];  Service: General;  Laterality: N/A;  . LAPAROSCOPY N/A 03/06/2013   Procedure: LAPAROSCOPY DIAGNOSTIC;  Surgeon: Adin Hector, MD;  [facility];  Service: General;  Laterality: N/A;  . LEFT HEART CATH AND CORS/GRAFTS ANGIOGRAPHY N/A 06/09/2017   Procedure: LEFT HEART CATH AND CORS/GRAFTS ANGIOGRAPHY;  Surgeon: Belva Crome, MD;  Location: [place] [facility];  Service: Cardiovascular;  Laterality: N/A;  . PERIPHERAL VASCULAR CATHETERIZATION Right 04/30/2014   Procedure: EMBOLIZATION  right internal iliac;  Surgeon: Elam Dutch, MD;  Location: [place];  Service: Vascular;  Laterality: Right;  . RADIOLOGY WITH ANESTHESIA N/A 05/20/2017   Procedure: Endoleak Repair;  Surgeon: Jacqulynn Cadet, MD;  Location: [place];  Service: Radiology;  Laterality: N/A;    Current Medications: No outpatient medications have been marked as taking for the 05/08/18 encounter (Appointment) with Belva Crome, MD.     Allergies:   Patient has no known allergies.   Social History   Socioeconomic History  . Marital status: Married    Spouse name: Not on file  . Number of children: Not on file  . Years of education: Not on file  . Highest education level: Not on file  Occupational History  . Not on file  Social Needs  . Financial resource strain: Not on file  . Food insecurity:    Worry: Not on file    Inability: Not on file  . Transportation needs:    Medical: Not on file    Non-medical: Not on file  Tobacco Use  . Smoking status: Never Smoker  . Smokeless tobacco: Never Used  Substance and Sexual Activity  . Alcohol use: Yes    Comment: occasional  . Drug use: No  . Sexual activity: Never  Lifestyle  . Physical activity:    Days per week: Not on file    Minutes per session: Not on file  . Stress: Not on file  Relationships  . Social connections:    Talks on phone: Not on file    Gets together: Not on file    Attends religious service: Not on file    Active member of club or organization: Not on file    Attends meetings of clubs or organizations: Not on file    Relationship status: Not on file  Other Topics Concern  . Not on file  Social History Narrative  . Not on file     Family History: The patient's family history includes AAA (abdominal aortic aneurysm) in his father; Cancer in his mother; Heart disease in his brother and father; Lung cancer in his mother.  ROS:   Please see the history of present illness.    *** All other systems reviewed and are  negative.  EKGs/Labs/Other Studies Reviewed:    The following studies were reviewed today: CHEST CT ANGIO 04/10/2018: IMPRESSION: 1. Successful embolization of native right coronary artery aneurysm. No further contrast opacification of the aneurysm. Although precise measurements are difficult given the streak artifact associated with the embolization coils, the aneurysm sac appears to have decreased in size now measuring approximately 3.8 cm compared to 4.6 cm previously. 2. Multivessel coronary artery disease status post CABG.  EKG:  EKG is ***  Recent Labs: 07/16/2017: BUN 19; Creatinine, Ser 1.15; Hemoglobin 10.0; Platelets 120; Potassium 3.9; Sodium 140 08/08/2017: ALT 21  Recent Lipid Panel    Component Value Date/Time   CHOL 156 08/08/2017 0741  TRIG 82 08/08/2017 0741   HDL 56 08/08/2017 0741   CHOLHDL 2.8 08/08/2017 0741   CHOLHDL 2.8 11/25/2016 1349   VLDL 14 11/25/2016 1349   LDLCALC 84 08/08/2017 0741    Physical Exam:    VS:  There were no vitals taken for this visit.    Wt Readings from Last 3 Encounters:  12/08/17 256 lb (116.1 kg)  07/22/17 258 lb 12.8 oz (117.4 kg)  07/16/17 262 lb 2 oz (118.9 kg)     GEN: ***. No acute distress HEENT: Normal NECK: No JVD. LYMPHATICS: No lymphadenopathy CARDIAC: ***RRR.  *** murmur, gallop, edema VASCULAR: Pulses ***, Bruits *** RESPIRATORY:  Clear to auscultation without rales, wheezing or rhonchi  ABDOMEN: Soft, non-tender, non-distended, No pulsatile mass, MUSCULOSKELETAL: No deformity  SKIN: Warm and dry NEUROLOGIC:  Alert and oriented x 3 PSYCHIATRIC:  Normal affect   ASSESSMENT:    1. Thoracic aortic aneurysm without rupture ([place])   2. Coronary aneurysm   3. Coronary artery disease involving coronary bypass graft of native heart without angina pectoris   4. Mixed hyperlipidemia   5. Essential hypertension   6. Endoleak of aortic graft ([facility])    PLAN:    In order of problems listed  above:  1. ***   Medication Adjustments/Labs and Tests Ordered: Current medicines are reviewed at length with the patient today.  Concerns regarding medicines are outlined above.  No orders of the defined types were placed in this encounter.  No orders of the defined types were placed in this encounter.   There are no Patient Instructions on file for this visit.   Signed, Sinclair Grooms, MD  05/07/2018 7:35 PM    [place]

## 2018-05-08 ENCOUNTER — Ambulatory Visit: Payer: 59 | Admitting: Interventional Cardiology

## 2018-05-08 ENCOUNTER — Ambulatory Visit (INDEPENDENT_AMBULATORY_CARE_PROVIDER_SITE_OTHER): Payer: 59 | Admitting: Interventional Cardiology

## 2018-05-08 ENCOUNTER — Encounter: Payer: Self-pay | Admitting: Interventional Cardiology

## 2018-05-08 ENCOUNTER — Encounter

## 2018-05-08 VITALS — BP 144/88 | HR 62 | Ht 71.0 in | Wt 244.1 lb

## 2018-05-08 DIAGNOSIS — IMO0001 Reserved for inherently not codable concepts without codable children: Secondary | ICD-10-CM

## 2018-05-08 DIAGNOSIS — I25810 Atherosclerosis of coronary artery bypass graft(s) without angina pectoris: Secondary | ICD-10-CM | POA: Diagnosis not present

## 2018-05-08 DIAGNOSIS — I10 Essential (primary) hypertension: Secondary | ICD-10-CM

## 2018-05-08 DIAGNOSIS — E782 Mixed hyperlipidemia: Secondary | ICD-10-CM

## 2018-05-08 DIAGNOSIS — I2541 Coronary artery aneurysm: Secondary | ICD-10-CM | POA: Diagnosis not present

## 2018-05-08 DIAGNOSIS — T82330A Leakage of aortic (bifurcation) graft (replacement), initial encounter: Secondary | ICD-10-CM

## 2018-05-08 MED ORDER — ROSUVASTATIN CALCIUM 40 MG PO TABS: 40.0000 mg | ORAL_TABLET | Freq: Every day | ORAL | 2 refills | Status: DC

## 2018-05-08 NOTE — Progress Notes (Signed)
Cardiology Office Note:    Date:  05/08/2018   ID:  Daniel Blevins, DOB 1961-02-01, MRN [id number]  PCP:  Lajean Manes, MD  Cardiologist:  Sinclair Grooms, MD   Referring MD: Lajean Manes, MD   Chief Complaint  Patient presents with  . Coronary Artery Disease  . Advice Only    Coronary and abdominal aneurysm    History of Present Illness:    Daniel Blevins is a 58 y.o. male with a hx of follow-up of coronary artery disease with bypass surgery 2001, history of abdominal aortic aneurysm treated with [id number], and subsequent repeat procedures for endoleak.Recent emergency room visit an overnight hospital stay for chest pain. Low risk myocardial perfusion study. Right coronary artery aneurysm embolization March 2019.  There are no cardiopulmonary complaints.  He has not had syncope.  He denies angina.  No dyspnea or difficulty with ambulation.    Past Medical History:  Diagnosis Date  . AAA (abdominal aortic aneurysm) ([place])    a. s/p EVAR 2015 with endoleak treated by IR.  Marland Kitchen AKI (acute kidney injury) ([place])   . Chest pain   . CKD (chronic kidney disease), stage II   . Coronary artery disease    a. CABG 2001 [facility]. b. Cath 06/2017 to evaluate coronary aneurysm, workup in progress as of 06/2017.  Marland Kitchen Deafness in right ear   . Depression   . Dysrhythmia   . 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 History of kidney stones   . Hx of CABG   . Hyperlipidemia   . Hypertension   . Sleep apnea    no longer is an issue  . Urinary frequency   . Vascular anomaly    a. 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    Past Surgical History:  Procedure Laterality Date  . ABDOMINAL AORTIC ENDOVASCULAR STENT GRAFT N/A 04/30/2014   Procedure: ABDOMINAL AORTIC ENDOVASCULAR STENT GRAFT;  Surgeon: Elam Dutch, MD;  [facility];  Service: Vascular;   Laterality: N/A;  . ABDOMINAL AORTIC ENDOVASCULAR STENT GRAFT N/A 05/26/2015   Procedure: ABDOMINAL AORTIC ENDOVASCULAR STENT GRAFT RE-INTERVENTION; REPAIR OF PROXIMAL CUFF;  Surgeon: Elam Dutch, MD;  Location: [place];  Service: Vascular;  Laterality: N/A;  . BACK SURGERY  2009   lower back  . CARDIAC CATHETERIZATION    . COLONOSCOPY    . CORONARY ARTERY BYPASS GRAFT  2001  . CORONARY STENT INTERVENTION N/A 07/15/2017   Procedure: CORONARY STENT INTERVENTION;  Surgeon: Belva Crome, MD;  Location: [place] [facility];  Service: Cardiovascular;  Laterality: N/A;  . CYSTOSCOPY WITH RETROGRADE PYELOGRAM, URETEROSCOPY AND STENT PLACEMENT Bilateral 02/07/2014   Procedure: CYSTOSCOPY WITH BILATERAL RETROGRADE PYELOGRAM, BILATERAL URETEROSCOPY, RIGHT EXTRACTION OF STONES AND RIGHT STENT PLACEMENT;  Surgeon: Jorja Loa, MD;  [facility];  Service: Urology;  Laterality: Bilateral;  . ESOPHAGOGASTRODUODENOSCOPY    . IR AORTAGRAM ABDOMINAL SERIALOGRAM  05/20/2017  . IR CT SPINE LTD  05/20/2017  . IR EMBO ARTERIAL NOT HEMORR HEMANG INC GUIDE ROADMAPPING  05/20/2017  . IR GENERIC HISTORICAL  11/06/2015   IR RADIOLOGIST EVAL & MGMT 11/06/2015 Jacqulynn Cadet, MD [facility] INTERV RAD  . IR GENERIC HISTORICAL  04/20/2016   IR RADIOLOGIST EVAL & MGMT 04/20/2016 Jacqulynn Cadet, MD [facility] INTERV RAD  . IR RADIOLOGIST EVAL & MGMT  05/11/2017  . IR  RADIOLOGIST EVAL & MGMT  06/22/2017  . IR RADIOLOGIST EVAL & MGMT  12/08/2017  . LAPAROSCOPIC GASTRIC BYPASS  2010   [facility]  . LAPAROSCOPIC LYSIS OF ADHESIONS N/A 03/06/2013   Procedure: LAPAROSCOPIC LYSIS OF ADHESIONS AND CLOSURE OF INTERNAL HERNIAS x2;  Surgeon: Adin Hector, MD;  [facility];  Service: General;  Laterality: N/A;  . LAPAROSCOPY N/A 03/06/2013   Procedure: LAPAROSCOPY DIAGNOSTIC;  Surgeon: Adin Hector, MD;  [facility];  Service: General;  Laterality: N/A;  . LEFT HEART CATH AND CORS/GRAFTS ANGIOGRAPHY N/A 06/09/2017    Procedure: LEFT HEART CATH AND CORS/GRAFTS ANGIOGRAPHY;  Surgeon: Belva Crome, MD;  Location: [place] [facility];  Service: Cardiovascular;  Laterality: N/A;  . PERIPHERAL VASCULAR CATHETERIZATION Right 04/30/2014   Procedure: EMBOLIZATION right internal iliac;  Surgeon: Elam Dutch, MD;  Location: [place];  Service: Vascular;  Laterality: Right;  . RADIOLOGY WITH ANESTHESIA N/A 05/20/2017   Procedure: Endoleak Repair;  Surgeon: Jacqulynn Cadet, MD;  Location: [place];  Service: Radiology;  Laterality: N/A;    Current Medications: Current Meds  Medication Sig  . amLODipine (NORVASC) 10 MG tablet Take 10 mg by mouth daily.  Marland Kitchen aspirin 81 MG tablet Take 81 mg by mouth every morning.   Marland Kitchen buPROPion (WELLBUTRIN XL) 300 MG 24 hr tablet Take 300 mg by mouth daily.  . carvedilol (COREG) 25 MG tablet Take 1 tablet (25 mg total) by mouth 2 (two) times daily with a meal.  . rosuvastatin (CRESTOR) 40 MG tablet Take 1 tablet (40 mg total) by mouth daily.  . [DISCONTINUED] rosuvastatin (CRESTOR) 40 MG tablet Take 1 tablet (40 mg total) by mouth daily.     Allergies:   Patient has no known allergies.   Social History   Socioeconomic History  . Marital status: Married    Spouse name: Not on file  . Number of children: Not on file  . Years of education: Not on file  . Highest education level: Not on file  Occupational History  . Not on file  Social Needs  . Financial resource strain: Not on file  . Food insecurity:    Worry: Not on file    Inability: Not on file  . Transportation needs:    Medical: Not on file    Non-medical: Not on file  Tobacco Use  . Smoking status: Never Smoker  . Smokeless tobacco: Never Used  Substance and Sexual Activity  . Alcohol use: Yes    Comment: occasional  . Drug use: No  . Sexual activity: Never  Lifestyle  . Physical activity:    Days per week: Not on file    Minutes per session: Not on file  . Stress: Not on file  Relationships  . Social  connections:    Talks on phone: Not on file    Gets together: Not on file    Attends religious service: Not on file    Active member of club or organization: Not on file    Attends meetings of clubs or organizations: Not on file    Relationship status: Not on file  Other Topics Concern  . Not on file  Social History Narrative  . Not on file     Family History: The patient's family history includes AAA (abdominal aortic aneurysm) in his father; Cancer in his mother; Heart disease in his brother and father; Lung cancer in his mother.  ROS:   Please see the history of  present illness.    Abdominal discomfort has been occurring recently.  Only present when he awakens in the morning and gets out of bed.  After he is up for a while the discomfort gradually resolves.  There is no tenderness.  All other systems reviewed and are negative.  EKGs/Labs/Other Studies Reviewed:    The following studies were reviewed today: Chest CT angiogram of chest April 10, 2018:  : 1. Successful embolization of native right coronary artery aneurysm. No further contrast opacification of the aneurysm. Although precise measurements are difficult given the streak artifact associated with the embolization coils, the aneurysm sac appears to have decreased in size now measuring approximately 3.8 cm compared to 4.6 cm previously. 2. Multivessel coronary artery disease status post CABG.     EKG:  EKG is normal sinus rhythm with prominent voltage.  Small inferolateral Q waves.  PACs are noted.  No significant change compared to prior to most recent tracing which was performed in March 2019.  Recent Labs: 07/16/2017: BUN 19; Creatinine, Ser 1.15; Hemoglobin 10.0; Platelets 120; Potassium 3.9; Sodium 140 08/08/2017: ALT 21  Recent Lipid Panel    Component Value Date/Time   CHOL 156 08/08/2017 0741   TRIG 82 08/08/2017 0741   HDL 56 08/08/2017 0741   CHOLHDL 2.8 08/08/2017 0741   CHOLHDL 2.8 11/25/2016 1349     VLDL 14 11/25/2016 1349   LDLCALC 84 08/08/2017 0741    Physical Exam:    VS:  BP (!) 144/88   Pulse 62   Ht 5\' 11"  (1.803 m)   Wt 244 lb 1.9 oz (110.7 kg)   SpO2 98%   BMI 34.05 kg/m     Wt Readings from Last 3 Encounters:  05/08/18 244 lb 1.9 oz (110.7 kg)  12/08/17 256 lb (116.1 kg)  07/22/17 258 lb 12.8 oz (117.4 kg)     GEN: Moderate obesity. No acute distress HEENT: Normal NECK: No JVD. LYMPHATICS: No lymphadenopathy CARDIAC: RRR.  No murmur, gallop, edema VASCULAR: Pulses 2+ and symmetric radial and carotids., Bruits absent in carotid territory RESPIRATORY:  Clear to auscultation without rales, wheezing or rhonchi  ABDOMEN: Soft, non-tender, non-distended, No pulsatile mass, MUSCULOSKELETAL: No deformity  SKIN: Warm and dry NEUROLOGIC:  Alert and oriented x 3 PSYCHIATRIC:  Normal affect   ASSESSMENT:    1. Coronary aneurysm   2. Coronary artery disease involving coronary bypass graft of native heart without angina pectoris   3. Mixed hyperlipidemia   4. Essential hypertension   5. Endoleak post endovascular aneurysm repair, initial encounter [facility])    PLAN:    In order of problems listed above:  1. Recent CT demonstrates weathering of the right carotid aneurysm from 4.6 to 3.8 cm in size with absent flow.  Plan surveillance in 12 months. 2. Asymptomatic.  Secondary risk prevention rediscussed. 3. LDL target less than 70.  We will pause statin therapy at this time as the patient feels this may have something to do with his abdominal upset. 4. Blood pressure target 130/80 mmHg or less. 5. Being followed by Dr. Laurence Ferrari. 6. Will discontinue statin for 1 month to see if abdominal discomfort improves.  If no improvement he will need to see GI and his primary care physician to get further work-up.  If worsens in the interval he needs to call.  Clinical follow-up in 1 year.  Repeat coronary CT Angie oh to size both LAD and right coronary aneurysm, requested to  be performed 1 month prior to  office visit.  Call in 1 month with follow-up concerning abdominal discomfort to give further instruction concerning Crestor.   Medication Adjustments/Labs and Tests Ordered: Current medicines are reviewed at length with the patient today.  Concerns regarding medicines are outlined above.  Orders Placed This Encounter  Procedures  . EKG 12-Lead   Meds ordered this encounter  Medications  . rosuvastatin (CRESTOR) 40 MG tablet    Sig: Take 1 tablet (40 mg total) by mouth daily.    Dispense:  90 tablet    Refill:  2    Patient Instructions  Medication Instructions:  .1) You may stop your Rosuvastatin for 1 month and call us at that time and let us know how abdominal cramping is doing  If you need a refill on your cardiac medications before your next appointment, please call your pharmacy.   Lab work: None If you have labs (blood work) drawn today and your tests are completely normal, you will receive your results only by: Marland Kitchen MyChart Message (if you have MyChart) OR . A paper copy in the mail If you have any lab test that is abnormal or we need to change your treatment, we will call you to review the results.  Testing/Procedures: Your physician has requested that you have cardiac CT in December 2020. Cardiac computed tomography (CT) is a painless test that uses an x-ray machine to take clear, detailed pictures of your heart. For further information please visit [facility]uge[facility]iesta[facility]tn. Please follow instruction sheet as given.   Follow-Up: At [facility], you and your health needs are our priority.  As part of our continuing mission to provide you with exceptional heart care, we have created designated Provider Care Teams.  These Care Teams include your primary Cardiologist (physician) and Advanced Practice Providers (APPs -  Physician Assistants and Nurse Practitioners) who all work together to provide you with the care you need, when you need it. You  will need a follow up appointment in 12 months.  Please call our office 2 months in advance to schedule this appointment.  You may see Sinclair Grooms, MD or one of the following Advanced Practice Providers on your designated Care Team:   Truitt Merle, NP Cecilie Kicks, NP . Kathyrn Drown, NP  Any Other Special Instructions Will Be Listed Below (If Applicable).  Please arrive at the [facility] main entrance of [facility] 30-45 minutes prior to test start time  [facility] [place], [place] [address] [phone]  Proceed to the [facility] Radiology Department (First Floor).  Please follow these instructions carefully (unless otherwise directed):  Hold all erectile dysfunction medications at least 48 hours prior to test.  On the Night Before the Test: . Be sure to Drink plenty of water. . Do not consume any caffeinated/decaffeinated beverages or chocolate 12 hours prior to your test. . Do not take any antihistamines 12 hours prior to your test. . If you take Metformin do not take 24 hours prior to test.  On the Day of the Test: . Drink plenty of water. Do not drink any water within one hour of the test. . Do not eat any food 4 hours prior to the test. . You may take your regular medications prior to the test.  . Take metoprolol (Lopressor) two hours prior to test. . HOLD Furosemide/Hydrochlorothiazide morning of the test.       After the Test: . Drink plenty of water. . After receiving IV contrast,  you may experience a mild flushed feeling. This is normal. . On occasion, you may experience a mild rash up to 24 hours after the test. This is not dangerous. If this occurs, you can take Benadryl 25 mg and increase your fluid intake. . If you experience trouble breathing, this can be serious. If it is severe call 911 IMMEDIATELY. If it is mild, please call our office. . If you take any of these medications: Glipizide/Metformin, Avandament,  Glucavance, please do not take 48 hours after completing test.      Signed, Sinclair Grooms, MD  05/08/2018 2:39 PM    [place]

## 2018-05-08 NOTE — Patient Instructions (Addendum)
Medication Instructions:  .1) You may stop your Rosuvastatin for 1 month and call us at that time and let us know how abdominal cramping is doing  If you need a refill on your cardiac medications before your next appointment, please call your pharmacy.   Lab work: None If you have labs (blood work) drawn today and your tests are completely normal, you will receive your results only by: Marland Kitchen MyChart Message (if you have MyChart) OR . A paper copy in the mail If you have any lab test that is abnormal or we need to change your treatment, we will call you to review the results.  Testing/Procedures: Your physician has requested that you have cardiac CT in December 2020. Cardiac computed tomography (CT) is a painless test that uses an x-ray machine to take clear, detailed pictures of your heart. For further information please visit [email or web address]. Please follow instruction sheet as given.   Follow-Up: At [facility], you and your health needs are our priority.  As part of our continuing mission to provide you with exceptional heart care, we have created designated Provider Care Teams.  These Care Teams include your primary Cardiologist (physician) and Advanced Practice Providers (APPs -  Physician Assistants and Nurse Practitioners) who all work together to provide you with the care you need, when you need it. You will need a follow up appointment in 12 months.  Please call our office 2 months in advance to schedule this appointment.  You may see Sinclair Grooms, MD or one of the following Advanced Practice Providers on your designated Care Team:   Truitt Merle, NP Cecilie Kicks, NP . Kathyrn Drown, NP  Any Other Special Instructions Will Be Listed Below (If Applicable).  Please arrive at the [facility] main entrance of [facility] 30-45 minutes prior to test start time  [facility] [place], [place] [address] [phone]  Proceed to the [facility] Radiology Department (First Floor).  Please follow these instructions carefully (unless otherwise directed):  Hold all erectile dysfunction medications at least 48 hours prior to test.  On the Night Before the Test: . Be sure to Drink plenty of water. . Do not consume any caffeinated/decaffeinated beverages or chocolate 12 hours prior to your test. . Do not take any antihistamines 12 hours prior to your test. . If you take Metformin do not take 24 hours prior to test.  On the Day of the Test: . Drink plenty of water. Do not drink any water within one hour of the test. . Do not eat any food 4 hours prior to the test. . You may take your regular medications prior to the test.  . Take metoprolol (Lopressor) two hours prior to test. . HOLD Furosemide/Hydrochlorothiazide morning of the test.       After the Test: . Drink plenty of water. . After receiving IV contrast, you may experience a mild flushed feeling. This is normal. . On occasion, you may experience a mild rash up to 24 hours after the test. This is not dangerous. If this occurs, you can take Benadryl 25 mg and increase your fluid intake. . If you experience trouble breathing, this can be serious. If it is severe call 911 IMMEDIATELY. If it is mild, please call our office. . If you take any of these medications: Glipizide/Metformin, Avandament, Glucavance, please do not take 48 hours after completing test.

## 2018-05-15 ENCOUNTER — Other Ambulatory Visit: Payer: Self-pay | Admitting: Internal Medicine

## 2018-05-15 ENCOUNTER — Ambulatory Visit
Admission: RE | Admit: 2018-05-15 | Discharge: 2018-05-15 | Disposition: A | Discharge status: Home / Self Care | Payer: 59 | Source: Ambulatory Visit | Attending: Internal Medicine | Admitting: Internal Medicine

## 2018-05-15 DIAGNOSIS — M545 Low back pain, unspecified: Secondary | ICD-10-CM

## 2018-06-05 NOTE — Progress Notes (Signed)
[facility] [address], [place] [address] Phone: [phone] Subjective:   [name], am serving as a scribe for Dr. Hulan Saas.  I'm seeing this patient by the request  of:    CC: Back pain  NID:[id number]  Daniel Blevins is a 58 y.o. male coming in with complaint of pain in bilateral groin. Was on his phone in December and fell to ground as his chair was not beneath him. Pain immediately in the lower back. Pain has moved from back to the anterior hip. Pain at night when he lies down. Pain decreases throughout. Has not tried any modalities to improve his pain. Occasionally takes NSAIDs. Denies any radiating symptoms.     X-rays were taken May 15, 2018.  These were independently visualized by me.  Found to have moderate to severe degenerative disc disease with seem to be worse at L3-L4.  This also irregularity and sclerosis  Past Medical History:  Diagnosis Date  . AAA (abdominal aortic aneurysm) ([place])    a. s/p EVAR 2015 with endoleak treated by IR.  Marland Kitchen AKI (acute kidney injury) ([place])   . Chest pain   . CKD (chronic kidney disease), stage II   . Coronary artery disease    a. CABG 2001 at [facility]. b. Cath 06/2017 to evaluate coronary aneurysm, workup in progress as of 06/2017.  Marland Kitchen Deafness in right ear   . Depression   . Dysrhythmia   . 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 History of kidney stones   . Hx of CABG   . Hyperlipidemia   . Hypertension   . Sleep apnea    no longer is an issue  . Urinary frequency   . Vascular anomaly    a. 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   Past Surgical History:  Procedure Laterality Date  . ABDOMINAL AORTIC ENDOVASCULAR STENT GRAFT N/A 04/30/2014   Procedure: ABDOMINAL AORTIC ENDOVASCULAR STENT GRAFT;  Surgeon: Elam Dutch, MD;  Location: [facility] OR;   Service: Vascular;  Laterality: N/A;  . ABDOMINAL AORTIC ENDOVASCULAR STENT GRAFT N/A 05/26/2015   Procedure: ABDOMINAL AORTIC ENDOVASCULAR STENT GRAFT RE-INTERVENTION; REPAIR OF PROXIMAL CUFF;  Surgeon: Elam Dutch, MD;  Location: [place];  Service: Vascular;  Laterality: N/A;  . BACK SURGERY  2009   lower back  . CARDIAC CATHETERIZATION    . COLONOSCOPY    . CORONARY ARTERY BYPASS GRAFT  2001  . CORONARY STENT INTERVENTION N/A 07/15/2017   Procedure: CORONARY STENT INTERVENTION;  Surgeon: Belva Crome, MD;  Location: [place] [facility];  Service: Cardiovascular;  Laterality: N/A;  . CYSTOSCOPY WITH RETROGRADE PYELOGRAM, URETEROSCOPY AND STENT PLACEMENT Bilateral 02/07/2014   Procedure: CYSTOSCOPY WITH BILATERAL RETROGRADE PYELOGRAM, BILATERAL URETEROSCOPY, RIGHT EXTRACTION OF STONES AND RIGHT STENT PLACEMENT;  Surgeon: Jorja Loa, MD;  Location: [facility];  Service: Urology;  Laterality: Bilateral;  . ESOPHAGOGASTRODUODENOSCOPY    . IR AORTAGRAM ABDOMINAL SERIALOGRAM  05/20/2017  . IR CT SPINE LTD  05/20/2017  . IR EMBO ARTERIAL NOT HEMORR HEMANG INC GUIDE ROADMAPPING  05/20/2017  . IR GENERIC HISTORICAL  11/06/2015   IR RADIOLOGIST EVAL & MGMT 11/06/2015 Jacqulynn Cadet, MD [facility] INTERV RAD  . IR GENERIC HISTORICAL  04/20/2016   IR RADIOLOGIST EVAL & MGMT 04/20/2016 Jacqulynn Cadet, MD [facility] INTERV RAD  . IR RADIOLOGIST  EVAL & MGMT  05/11/2017  . IR RADIOLOGIST EVAL & MGMT  06/22/2017  . IR RADIOLOGIST EVAL & MGMT  12/08/2017  . LAPAROSCOPIC GASTRIC BYPASS  2010   [facility]  . LAPAROSCOPIC LYSIS OF ADHESIONS N/A 03/06/2013   Procedure: LAPAROSCOPIC LYSIS OF ADHESIONS AND CLOSURE OF INTERNAL HERNIAS x2;  Surgeon: Adin Hector, MD;  Location: [facility];  Service: General;  Laterality: N/A;  . LAPAROSCOPY N/A 03/06/2013   Procedure: LAPAROSCOPY DIAGNOSTIC;  Surgeon: Adin Hector, MD;  Location: [facility];  Service: General;  Laterality: N/A;  . LEFT HEART CATH AND CORS/GRAFTS  ANGIOGRAPHY N/A 06/09/2017   Procedure: LEFT HEART CATH AND CORS/GRAFTS ANGIOGRAPHY;  Surgeon: Belva Crome, MD;  Location: [place] [facility];  Service: Cardiovascular;  Laterality: N/A;  . PERIPHERAL VASCULAR CATHETERIZATION Right 04/30/2014   Procedure: EMBOLIZATION right internal iliac;  Surgeon: Elam Dutch, MD;  Location: [place];  Service: Vascular;  Laterality: Right;  . RADIOLOGY WITH ANESTHESIA N/A 05/20/2017   Procedure: Endoleak Repair;  Surgeon: Jacqulynn Cadet, MD;  Location: [facility];  Service: Radiology;  Laterality: N/A;   Social History   Socioeconomic History  . Marital status: Married    Spouse name: Not on file  . Number of children: Not on file  . Years of education: Not on file  . Highest education level: Not on file  Occupational History  . Not on file  Social Needs  . Financial resource strain: Not on file  . Food insecurity:    Worry: Not on file    Inability: Not on file  . Transportation needs:    Medical: Not on file    Non-medical: Not on file  Tobacco Use  . Smoking status: Never Smoker  . Smokeless tobacco: Never Used  Substance and Sexual Activity  . Alcohol use: Yes    Comment: occasional  . Drug use: No  . Sexual activity: Never  Lifestyle  . Physical activity:    Days per week: Not on file    Minutes per session: Not on file  . Stress: Not on file  Relationships  . Social connections:    Talks on phone: Not on file    Gets together: Not on file    Attends religious service: Not on file    Active member of club or organization: Not on file    Attends meetings of clubs or organizations: Not on file    Relationship status: Not on file  Other Topics Concern  . Not on file  Social History Narrative  . Not on file   No Known Allergies Family History  Problem Relation Age of Onset  . Lung cancer Mother   . Cancer Mother        Lung  . AAA (abdominal aortic aneurysm) Father   . Heart disease Father        before age 9  . Heart  disease Brother        before age 36     Current Outpatient Medications (Cardiovascular):  .  amLODipine (NORVASC) 10 MG tablet, Take 10 mg by mouth daily. .  carvedilol (COREG) 25 MG tablet, Take 1 tablet (25 mg total) by mouth 2 (two) times daily with a meal. .  rosuvastatin (CRESTOR) 40 MG tablet, Take 1 tablet (40 mg total) by mouth daily.   Current Outpatient Medications (Analgesics):  .  aspirin 81 MG tablet, Take 81 mg by mouth every morning.    Current Outpatient Medications (  Other):  Marland Kitchen  buPROPion (WELLBUTRIN XL) 300 MG 24 hr tablet, Take 300 mg by mouth daily.    Past medical history, social, surgical and family history all reviewed in electronic medical record.  No pertanent information unless stated regarding to the chief complaint.   Review of Systems:  No headache, visual changes, nausea, vomiting, diarrhea, constipation, dizziness, abdominal pain, skin rash, fevers, chills, night sweats, weight loss, swollen lymph nodes, body aches, joint swelling, muscle aches, chest pain, shortness of breath, mood changes.   Objective  There were no vitals taken for this visit. Systems examined below as of    General: No apparent distress alert and oriented x3 mood and affect normal, dressed appropriately.  HEENT: Pupils equal, extraocular movements intact  Respiratory: Patient's speak in full sentences and does not appear short of breath  Cardiovascular: No lower extremity edema, non tender, no erythema  Skin: Warm dry intact with no signs of infection or rash on extremities or on axial skeleton.  Abdomen: Soft nontender  Neuro: Cranial nerves II through XII are intact, neurovascularly intact in all extremities with 2+ DTRs and 2+ pulses.  Lymph: No lymphadenopathy of posterior or anterior cervical chain or axillae bilaterally.  Gait normal with good balance and coordination.  MSK:  Non tender with full range of motion and good stability and symmetric strength and tone of  shoulders, elbows, wrist,  knee and ankles bilaterally.  Back Exam:  Inspection: Unremarkable  Motion: Flexion 40 deg, Extension 25 deg, Side Bending to 35 deg bilaterally,  Rotation to 35 deg bilaterally  SLR laying: Negative severe tight hamstrings as well as tight hip flexors XSLR laying: Negative  Palpable tenderness: Tender to palpation the paraspinal musculature. FABER: Positive Faber..  Decreased internal range of motion of the hip Sensory change: Gross sensation intact to all lumbar and sacral dermatomes.  Reflexes: 2+ at both patellar tendons, 2+ at achilles tendons, Babinski's downgoing.  Strength at foot  Plantar-flexion: 5/5 Dorsi-flexion: 5/5 Eversion: 5/5 Inversion: 5/5  Leg strength  Quad: 5/5 Hamstring: 5/5 Hip flexor: 5/5 Hip abductors: 4/5  Gait unremarkable.  [id number]; 15 additional minutes spent for Therapeutic exercises as stated in above notes.  This included exercises focusing on stretching, strengthening, with significant focus on eccentric aspects.   Long term goals include an improvement in range of motion, strength, endurance as well as avoiding reinjury. Patient's frequency would include in 1-2 times a day, 3-5 times a week for a duration of 6-12 weeks. Low back exercises that included:  Pelvic tilt/bracing instruction to focus on control of the pelvic girdle and lower abdominal muscles  Glute strengthening exercises, focusing on proper firing of the glutes without engaging the low back muscles Proper stretching techniques for maximum relief for the hamstrings, hip flexors, low back and some rotation where tolerated   Proper technique shown and discussed handout in great detail with ATC.  All questions were discussed and answered.     Impression and Recommendations:     This case required medical decision making of moderate complexity. The above documentation has been reviewed and is accurate and complete Lyndal Pulley, DO       Note: This dictation was  prepared with Dragon dictation along with smaller phrase technology. Any transcriptional errors that result from this process are unintentional.

## 2018-06-06 ENCOUNTER — Ambulatory Visit (INDEPENDENT_AMBULATORY_CARE_PROVIDER_SITE_OTHER)
Admission: RE | Admit: 2018-06-06 | Discharge: 2018-06-06 | Disposition: A | Discharge status: Home / Self Care | Payer: 59 | Source: Ambulatory Visit | Attending: Family Medicine | Admitting: Family Medicine

## 2018-06-06 ENCOUNTER — Encounter: Payer: Self-pay | Admitting: Family Medicine

## 2018-06-06 ENCOUNTER — Ambulatory Visit (INDEPENDENT_AMBULATORY_CARE_PROVIDER_SITE_OTHER): Payer: 59 | Admitting: Family Medicine

## 2018-06-06 VITALS — BP 108/62 | HR 61 | Ht 71.0 in | Wt 244.0 lb

## 2018-06-06 DIAGNOSIS — M1612 Unilateral primary osteoarthritis, left hip: Secondary | ICD-10-CM | POA: Insufficient documentation

## 2018-06-06 DIAGNOSIS — M24559 Contracture, unspecified hip: Secondary | ICD-10-CM

## 2018-06-06 DIAGNOSIS — R1030 Lower abdominal pain, unspecified: Secondary | ICD-10-CM

## 2018-06-06 MED ORDER — GABAPENTIN 100 MG PO CAPS: 200.0000 mg | ORAL_CAPSULE | Freq: Every day | ORAL | 3 refills | Status: DC

## 2018-06-06 NOTE — Assessment & Plan Note (Signed)
Hip flexor tightness bilaterally.  Has had some significant difficulty with this over the course of time.  Patient does have some mild degenerative disc disease at L3-L4 that could give the radicular symptoms within the differential.  Home exercises given, in addition to this patient has significant decrease in range of motion of the left hip and concern for potential arthritic changes.  Patient will increase activity slowly over the course the next several days.  Follow-up with me again 4 to 8 weeks

## 2018-06-06 NOTE — Patient Instructions (Addendum)
Good to see you  Xray downstairs today  Ice 20 minutes 2 times daily. Usually after activity and before bed. Exercises 3 times a week.  Gabapentin 200mg  at night More biking elliptical or machine weight sif working out  Over the counter get  Vitamin D 2000 IU daily  Turmeric 500mg  daily  Tart cherry extract any dose at night  See me again in 4 weeks to make sure you are doing better

## 2018-06-21 ENCOUNTER — Other Ambulatory Visit: Payer: Self-pay | Admitting: Interventional Cardiology

## 2018-06-21 MED ORDER — ROSUVASTATIN CALCIUM 40 MG PO TABS: 40.0000 mg | ORAL_TABLET | Freq: Every day | ORAL | 2 refills | Status: DC

## 2018-06-29 ENCOUNTER — Other Ambulatory Visit: Payer: Self-pay | Admitting: Family Medicine

## 2018-07-06 ENCOUNTER — Ambulatory Visit: Payer: 59 | Admitting: Family Medicine

## 2018-07-18 ENCOUNTER — Telehealth: Payer: Self-pay | Admitting: Interventional Cardiology

## 2018-07-18 DIAGNOSIS — E782 Mixed hyperlipidemia: Secondary | ICD-10-CM

## 2018-07-18 NOTE — Telephone Encounter (Signed)
Spoke with pt and advised he did need to continue Rosuvastatin.  Advised we also needed to get cholesterol labs.  Pt planning to come on 08/03/2018 for these labs.

## 2018-07-18 NOTE — Telephone Encounter (Signed)
Left message to call back  

## 2018-07-18 NOTE — Telephone Encounter (Signed)
New Message:     Pt wants to know if he needs to continue taking his Rosuvastatin?

## 2018-08-03 ENCOUNTER — Other Ambulatory Visit: Payer: 59

## 2018-08-16 ENCOUNTER — Telehealth: Payer: Self-pay | Admitting: *Deleted

## 2018-08-16 NOTE — Telephone Encounter (Signed)
Copied from [place] [id number]. Topic: General - Other >> Aug 16, 2018 10:42 AM Richardo Priest, NT wrote: Reason for CRM: Patient called in stating he would like advice on CBD oil for his arthritis due to having family friends that take this and see improvement. Requesting a call back at [phone]

## 2018-08-16 NOTE — Telephone Encounter (Signed)
Very shotty evidence.  If trying would do oral not topical  Consider very reputable source though.

## 2018-08-23 NOTE — Telephone Encounter (Signed)
lmovm for pt to return call.  

## 2018-09-12 IMAGING — DX DG CHEST 2V
2 series · 2 of 2 positions shown · non-contrast
Comparison: 05/26/2015

CLINICAL DATA: Chest pain, shortness of Breath

EXAM:
CHEST  2 VIEW

[w chest lat]
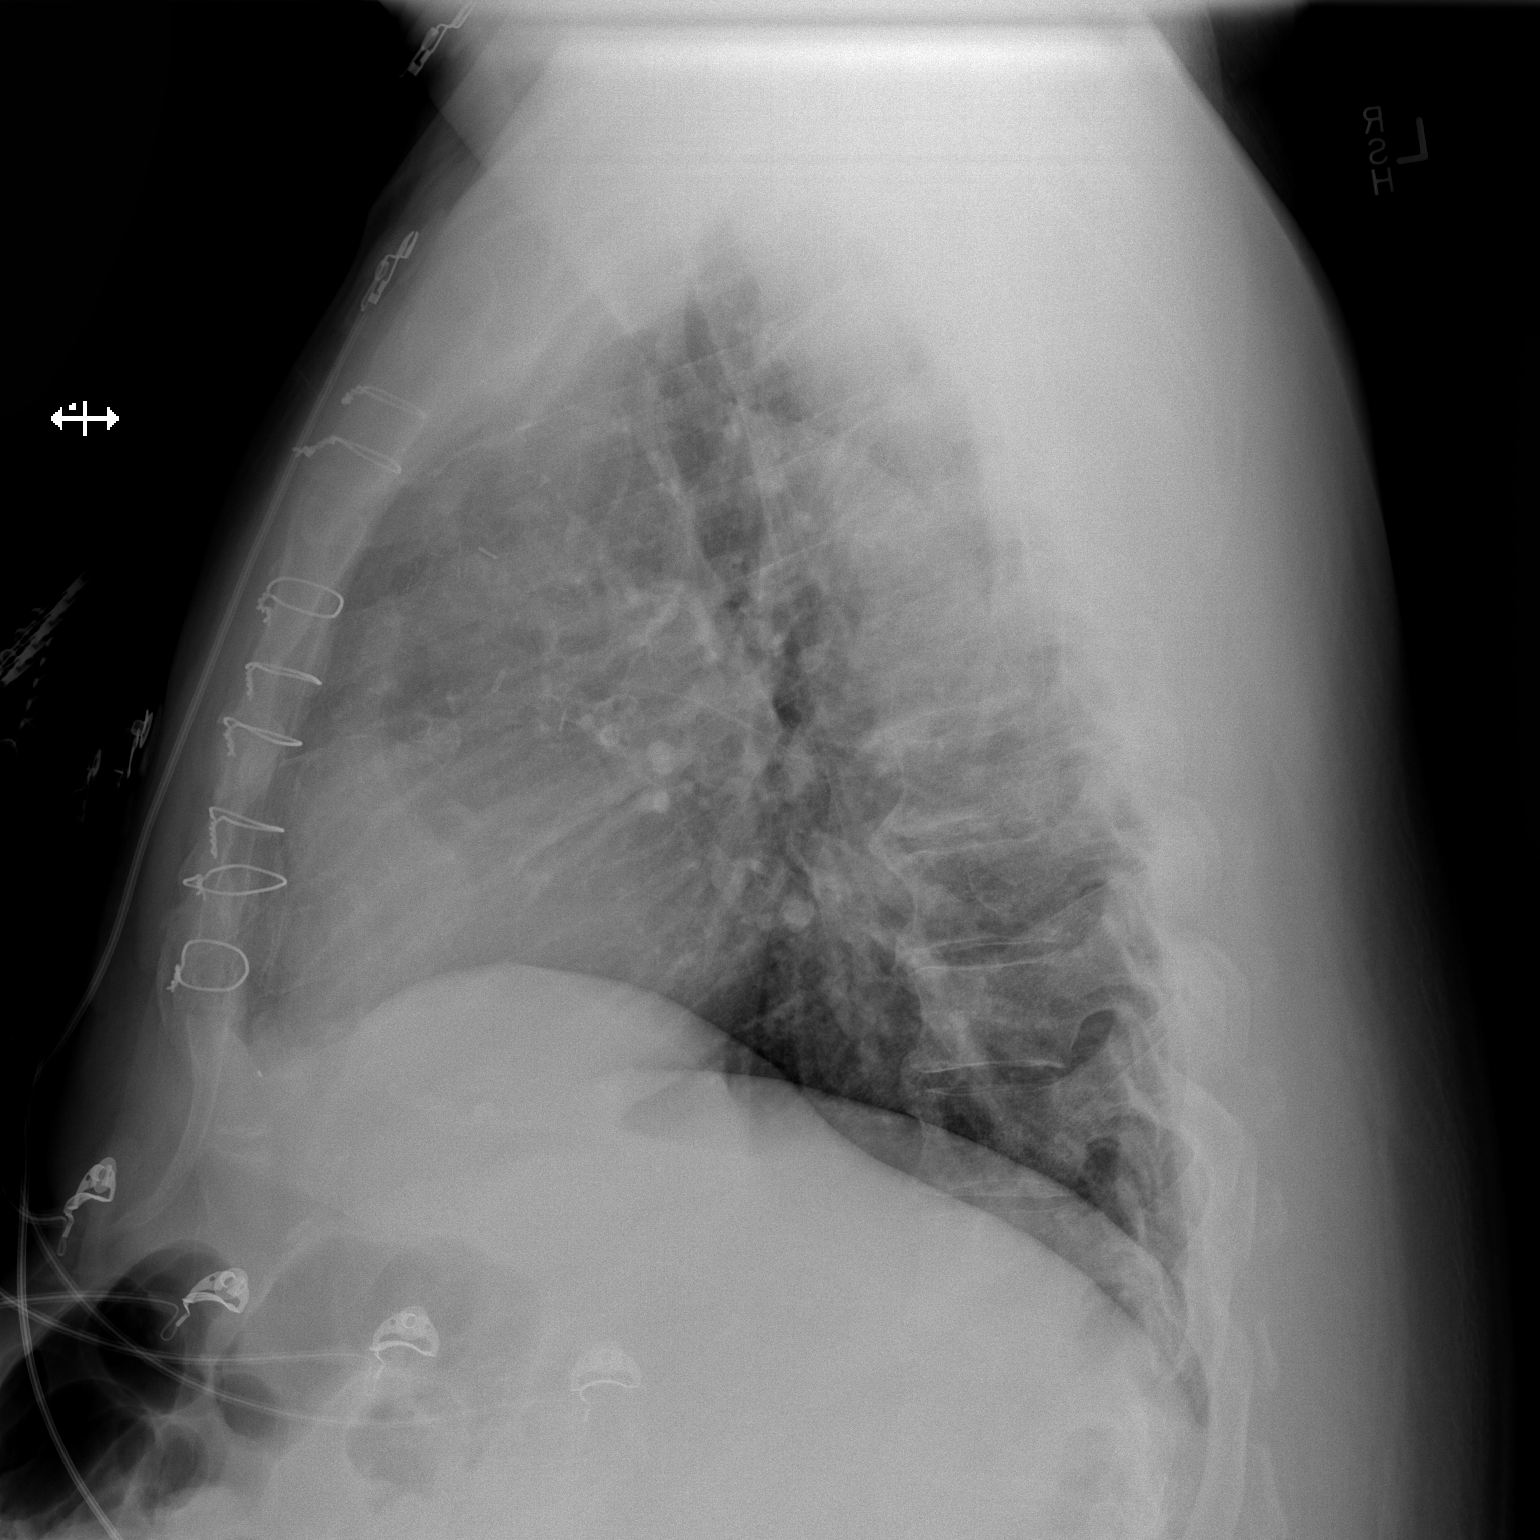

[w chest pa]
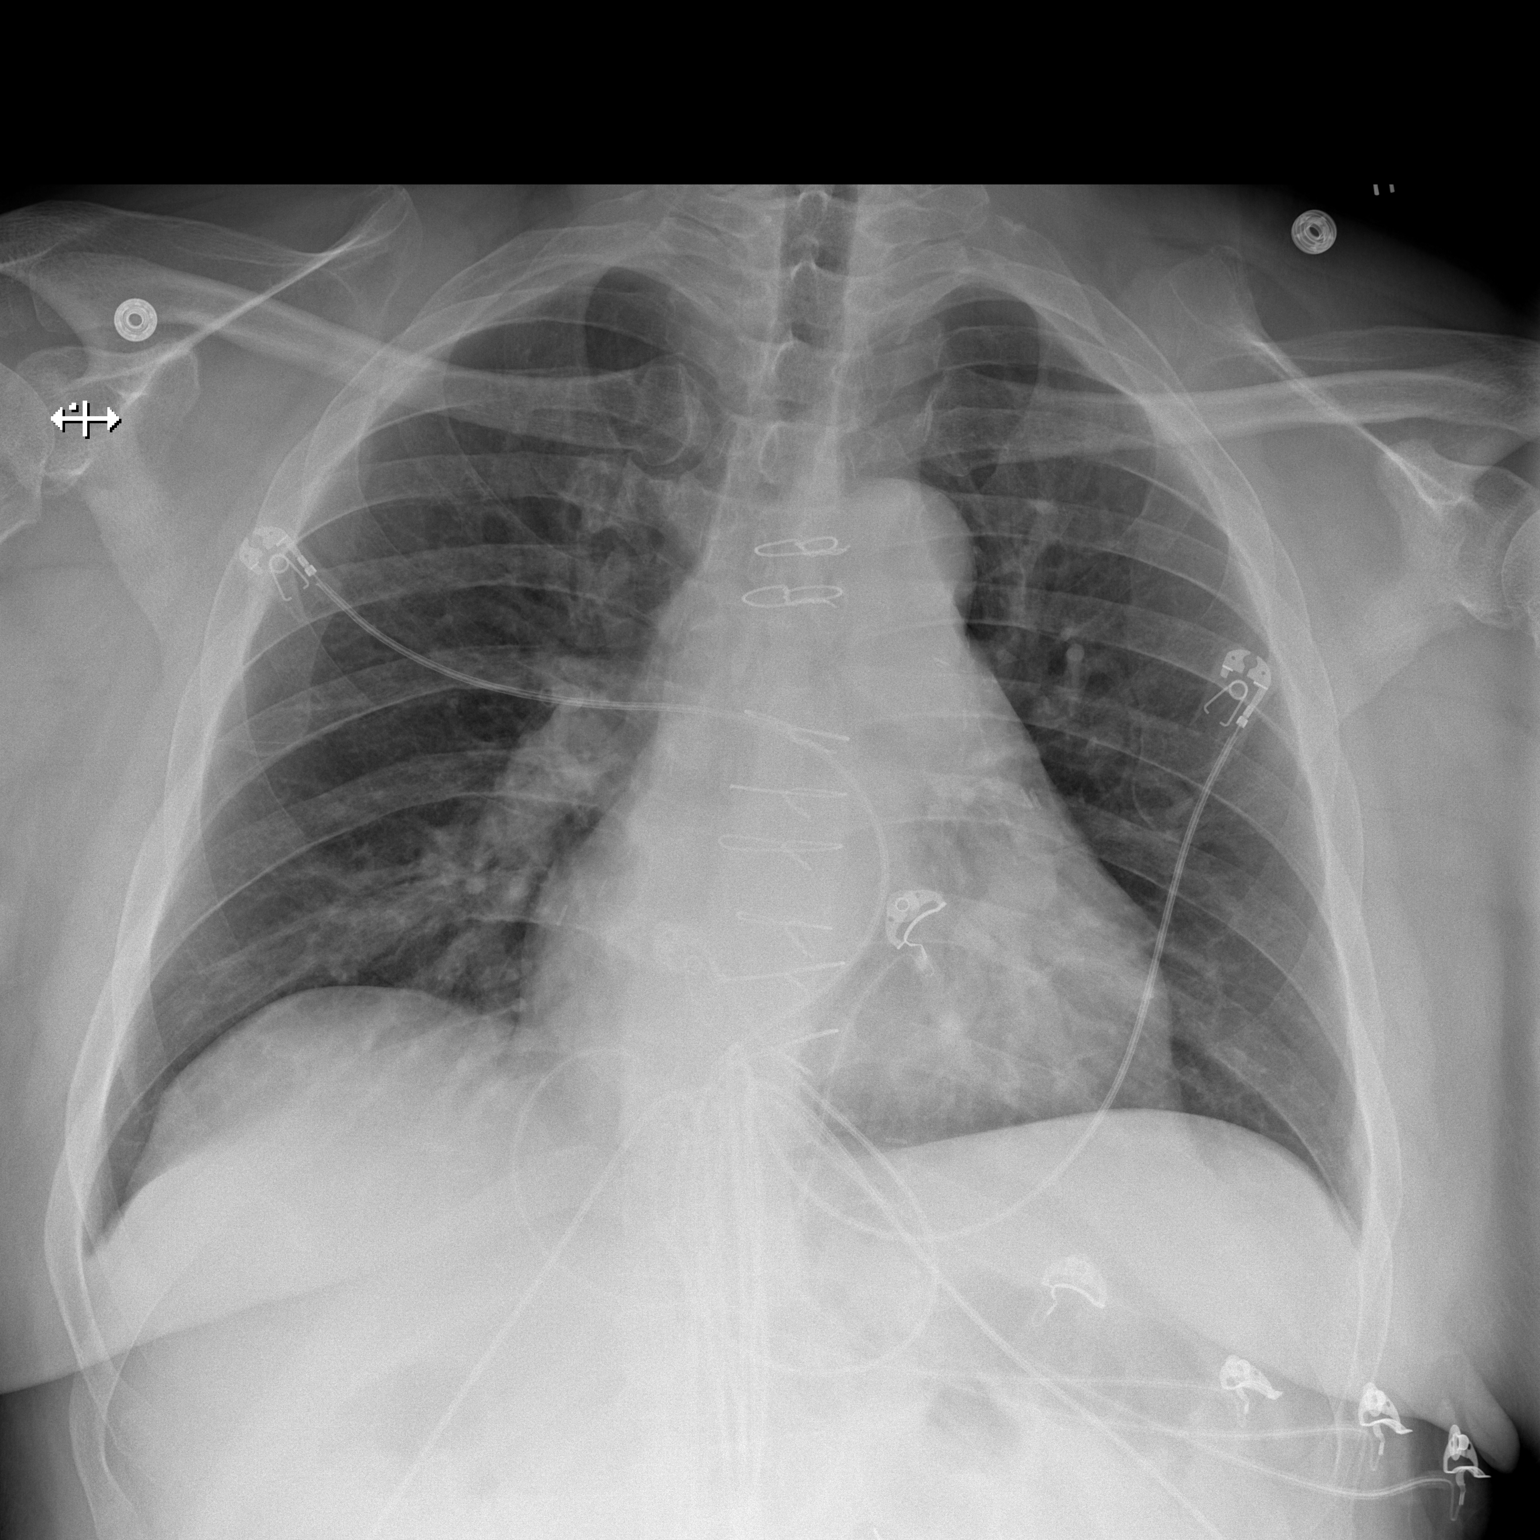

[2 of 2 positions shown; findings below may reference images not displayed]

FINDINGS: Prior CABG. Heart is borderline in size. Mild vascular congestion.
No confluent opacities or effusions. No acute bony abnormality.
IMPRESSION: Borderline heart size.  Vascular congestion.

## 2018-10-04 ENCOUNTER — Telehealth: Payer: Self-pay | Admitting: *Deleted

## 2018-10-04 NOTE — Telephone Encounter (Signed)
    COVID-19 Pre-Screening Questions:  . In the past 7 to 10 days have you had a cough,  shortness of breath, headache, congestion, fever (100 or greater) body aches, chills, sore throat, or sudden loss of taste or sense of smell? . Have you been around anyone with known Covid 19. . Have you been around anyone who is awaiting Covid 19 test results in the past 7 to 10 days? . Have you been around anyone who has been exposed to Covid 19, or has mentioned symptoms of Covid 19 within the past 7 to 10 days?  If you have any concerns/questions about symptoms patients report during screening (either on the phone or at threshold). Contact the provider seeing the patient or DOD for further guidance.  If neither are available contact a member of the leadership team.           Contacted patient via phone call. No to all Covid 19 questions. Has a mask. KB  

## 2018-10-05 ENCOUNTER — Other Ambulatory Visit: Payer: Self-pay

## 2018-10-05 ENCOUNTER — Other Ambulatory Visit: Payer: 59 | Admitting: *Deleted

## 2018-10-05 DIAGNOSIS — E782 Mixed hyperlipidemia: Secondary | ICD-10-CM

## 2018-10-05 LAB — HEPATIC FUNCTION PANEL
ALT: 24 IU/L (ref 0–44)
AST: 20 IU/L (ref 0–40)
Albumin: 4 g/dL (ref 3.8–4.9)
Alkaline Phosphatase: 66 IU/L (ref 39–117)
Bilirubin Total: 0.3 mg/dL (ref 0.0–1.2)
Bilirubin, Direct: 0.08 mg/dL (ref 0.00–0.40)
Total Protein: 6.8 g/dL (ref 6.0–8.5)

## 2018-10-05 LAB — LIPID PANEL
Chol/HDL Ratio: 4.8 ratio (ref 0.0–5.0)
Cholesterol, Total: 206 mg/dL — ABNORMAL HIGH (ref 100–199)
HDL: 43 mg/dL (ref >39)
LDL Calculated: 146 mg/dL — ABNORMAL HIGH (ref 0–99)
Triglycerides: 86 mg/dL (ref 0–149)
VLDL Cholesterol Cal: 17 mg/dL (ref 5–40)

## 2018-10-10 ENCOUNTER — Telehealth: Payer: Self-pay | Admitting: *Deleted

## 2018-10-10 DIAGNOSIS — E782 Mixed hyperlipidemia: Secondary | ICD-10-CM

## 2018-10-10 NOTE — Telephone Encounter (Signed)
-----   Message from Belva Crome, MD sent at 10/05/2018  5:41 PM EDT ----- Let the patient know the lipids are not at target. Has anything changed? Is he taking the Crestor? If so add Zetia 10 mg daily. Recheck Lipid in 2 months. If LDL still high will refer to Lipid clinic for PCSK-9 A copy will be sent to Lajean Manes, MD

## 2018-10-10 NOTE — Telephone Encounter (Signed)
See result note.  

## 2018-12-05 ENCOUNTER — Other Ambulatory Visit: Payer: 59

## 2018-12-05 ENCOUNTER — Other Ambulatory Visit: Payer: Self-pay

## 2018-12-05 DIAGNOSIS — E782 Mixed hyperlipidemia: Secondary | ICD-10-CM

## 2018-12-05 LAB — LIPID PANEL
Chol/HDL Ratio: 2.4 ratio (ref 0.0–5.0)
Cholesterol, Total: 121 mg/dL (ref 100–199)
HDL: 50 mg/dL (ref >39)
LDL Calculated: 58 mg/dL (ref 0–99)
Triglycerides: 64 mg/dL (ref 0–149)
VLDL Cholesterol Cal: 13 mg/dL (ref 5–40)

## 2018-12-05 LAB — HEPATIC FUNCTION PANEL
ALT: 18 IU/L (ref 0–44)
AST: 20 IU/L (ref 0–40)
Albumin: 3.9 g/dL (ref 3.8–4.9)
Alkaline Phosphatase: 59 IU/L (ref 39–117)
Bilirubin Total: 0.2 mg/dL (ref 0.0–1.2)
Bilirubin, Direct: 0.1 mg/dL (ref 0.00–0.40)
Total Protein: 6.7 g/dL (ref 6.0–8.5)

## 2018-12-16 ENCOUNTER — Other Ambulatory Visit: Payer: Self-pay | Admitting: Family Medicine

## 2018-12-18 NOTE — Telephone Encounter (Signed)
Left message for patient to schedule virtual for a refill.

## 2019-01-15 ENCOUNTER — Other Ambulatory Visit: Payer: Self-pay | Admitting: Interventional Radiology

## 2019-01-15 DIAGNOSIS — I714 Abdominal aortic aneurysm, without rupture, unspecified: Secondary | ICD-10-CM

## 2019-01-15 DIAGNOSIS — T82330D Leakage of aortic (bifurcation) graft (replacement), subsequent encounter: Secondary | ICD-10-CM

## 2019-01-15 DIAGNOSIS — IMO0001 Reserved for inherently not codable concepts without codable children: Secondary | ICD-10-CM

## 2019-01-18 ENCOUNTER — Encounter (HOSPITAL_COMMUNITY): Payer: Self-pay

## 2019-01-18 ENCOUNTER — Other Ambulatory Visit: Payer: Self-pay

## 2019-01-18 ENCOUNTER — Other Ambulatory Visit: Payer: Self-pay | Admitting: Interventional Radiology

## 2019-01-18 ENCOUNTER — Ambulatory Visit (HOSPITAL_COMMUNITY)
Admission: RE | Admit: 2019-01-18 | Discharge: 2019-01-18 | Disposition: A | Discharge status: Home / Self Care | Payer: 59 | Source: Ambulatory Visit | Attending: Interventional Radiology | Admitting: Interventional Radiology

## 2019-01-18 DIAGNOSIS — T82330D Leakage of aortic (bifurcation) graft (replacement), subsequent encounter: Secondary | ICD-10-CM | POA: Diagnosis present

## 2019-01-18 DIAGNOSIS — I724 Aneurysm of artery of lower extremity: Secondary | ICD-10-CM

## 2019-01-18 DIAGNOSIS — I714 Abdominal aortic aneurysm, without rupture, unspecified: Secondary | ICD-10-CM

## 2019-01-18 DIAGNOSIS — IMO0001 Reserved for inherently not codable concepts without codable children: Secondary | ICD-10-CM

## 2019-01-18 LAB — POCT I-STAT CREATININE: Creatinine, Ser: 1.2 mg/dL (ref 0.61–1.24)

## 2019-01-18 MED ORDER — SODIUM CHLORIDE (PF) 0.9 % IJ SOLN
INTRAMUSCULAR | Status: AC
  Filled 2019-01-18: qty 50

## 2019-01-18 MED ORDER — IOHEXOL 350 MG/ML SOLN
100.0000 mL | Freq: Once | INTRAVENOUS | Status: AC | PRN
  Administered 2019-01-18: 100 mL via INTRAVENOUS

## 2019-01-23 ENCOUNTER — Encounter: Payer: Self-pay | Admitting: *Deleted

## 2019-01-23 ENCOUNTER — Ambulatory Visit
Admission: RE | Admit: 2019-01-23 | Discharge: 2019-01-23 | Disposition: A | Discharge status: Home / Self Care | Payer: 59 | Source: Ambulatory Visit | Attending: Interventional Radiology | Admitting: Interventional Radiology

## 2019-01-23 DIAGNOSIS — I714 Abdominal aortic aneurysm, without rupture, unspecified: Secondary | ICD-10-CM

## 2019-01-23 DIAGNOSIS — IMO0001 Reserved for inherently not codable concepts without codable children: Secondary | ICD-10-CM

## 2019-01-23 DIAGNOSIS — T82330D Leakage of aortic (bifurcation) graft (replacement), subsequent encounter: Secondary | ICD-10-CM

## 2019-01-23 DIAGNOSIS — I724 Aneurysm of artery of lower extremity: Secondary | ICD-10-CM

## 2019-01-23 HISTORY — PX: IR RADIOLOGIST EVAL & MGMT: IMG5224

## 2019-01-23 NOTE — Progress Notes (Signed)
Chief Complaint: Patient was seen in follow up today for multiple arterial aneurysms at the request of [place]  Referring Physician(s): [name], MD  History of Present Illness: Daniel Blevins is a 58 y.o. male who underwentAAA repair with Dr. Oneida Alar in 2015. He subsequently developed an endoleak and was referred to interventional radiology for management. He underwent sucessful embolization back in 2017, however developed abdominal pain with associated CT findings demonstrating further progression of his endoleak. He subsequently underwent direct stick endoleak repair on 05/20/17. Patient tolerated the procedure well and was able to discharge home the same day.   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  Today, Daniel Blevins is in good spirits in his usual state of health.  He reports that he is completely clinically asymptomatic.  Specifically, he denies chest pain, shortness of breath or other systemic symptoms.  Past Medical History:  Diagnosis Date  . AAA (abdominal aortic aneurysm) ([place])    a. s/p EVAR 2015 with endoleak treated by IR.  Marland Kitchen AKI (acute kidney injury) ([place])   . Chest pain   . CKD (chronic kidney disease), stage II   . Coronary artery disease    a. CABG 2001 at [facility]. b. Cath 06/2017 to evaluate coronary aneurysm, workup in progress as of 06/2017.  Marland Kitchen Deafness in right ear   . Depression   . Dysrhythmia   . 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 History of kidney stones   . Hx of CABG   . Hyperlipidemia   . Hypertension   . Sleep apnea    no  longer is an issue  . Urinary frequency   . Vascular anomaly    a. 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    Past Surgical History:  Procedure Laterality Date  . ABDOMINAL AORTIC ENDOVASCULAR STENT GRAFT N/A 04/30/2014   Procedure: ABDOMINAL AORTIC ENDOVASCULAR STENT GRAFT;  Surgeon: Elam Dutch, MD;  Location: [facility];  Service: Vascular;  Laterality: N/A;  . ABDOMINAL AORTIC ENDOVASCULAR STENT GRAFT N/A 05/26/2015   Procedure: ABDOMINAL AORTIC ENDOVASCULAR STENT GRAFT RE-INTERVENTION; REPAIR OF PROXIMAL CUFF;  Surgeon: Elam Dutch, MD;  Location: [place];  Service: Vascular;  Laterality: N/A;  . BACK SURGERY  2009   lower back  . CARDIAC CATHETERIZATION    . COLONOSCOPY    . CORONARY ARTERY BYPASS GRAFT  2001  . CORONARY STENT INTERVENTION N/A 07/15/2017   Procedure: CORONARY STENT INTERVENTION;  Surgeon: Belva Crome, MD;  Location: [place] [facility];  Service: Cardiovascular;  Laterality: N/A;  . CYSTOSCOPY WITH RETROGRADE PYELOGRAM, URETEROSCOPY AND STENT PLACEMENT Bilateral 02/07/2014   Procedure: CYSTOSCOPY WITH BILATERAL RETROGRADE PYELOGRAM, BILATERAL URETEROSCOPY, RIGHT EXTRACTION OF STONES AND RIGHT STENT PLACEMENT;  Surgeon: Jorja Loa, MD;  Location: [facility];  Service: Urology;  Laterality: Bilateral;  . ESOPHAGOGASTRODUODENOSCOPY    . IR AORTAGRAM ABDOMINAL SERIALOGRAM  05/20/2017  . IR CT SPINE LTD  05/20/2017  . IR EMBO ARTERIAL NOT HEMORR HEMANG INC GUIDE ROADMAPPING  05/20/2017  . IR GENERIC HISTORICAL  11/06/2015  IR RADIOLOGIST EVAL & MGMT 11/06/2015 Jacqulynn Cadet, MD [facility] INTERV RAD  . IR GENERIC HISTORICAL  04/20/2016   IR RADIOLOGIST EVAL & MGMT 04/20/2016 Jacqulynn Cadet, MD [facility] INTERV RAD  . IR RADIOLOGIST EVAL & MGMT  05/11/2017  . IR RADIOLOGIST EVAL & MGMT  06/22/2017  . IR RADIOLOGIST EVAL & MGMT  12/08/2017  . IR  RADIOLOGIST EVAL & MGMT  01/23/2019  . LAPAROSCOPIC GASTRIC BYPASS  2010   [facility]  . LAPAROSCOPIC LYSIS OF ADHESIONS N/A 03/06/2013   Procedure: LAPAROSCOPIC LYSIS OF ADHESIONS AND CLOSURE OF INTERNAL HERNIAS x2;  Surgeon: Adin Hector, MD;  Location: [facility];  Service: General;  Laterality: N/A;  . LAPAROSCOPY N/A 03/06/2013   Procedure: LAPAROSCOPY DIAGNOSTIC;  Surgeon: Adin Hector, MD;  Location: [facility];  Service: General;  Laterality: N/A;  . LEFT HEART CATH AND CORS/GRAFTS ANGIOGRAPHY N/A 06/09/2017   Procedure: LEFT HEART CATH AND CORS/GRAFTS ANGIOGRAPHY;  Surgeon: Belva Crome, MD;  Location: [place] [facility];  Service: Cardiovascular;  Laterality: N/A;  . PERIPHERAL VASCULAR CATHETERIZATION Right 04/30/2014   Procedure: EMBOLIZATION right internal iliac;  Surgeon: Elam Dutch, MD;  Location: [place];  Service: Vascular;  Laterality: Right;  . RADIOLOGY WITH ANESTHESIA N/A 05/20/2017   Procedure: Endoleak Repair;  Surgeon: Jacqulynn Cadet, MD;  Location: [place];  Service: Radiology;  Laterality: N/A;    Allergies: Patient has no known allergies.  Medications: Prior to Admission medications   Medication Sig Start Date End Date Taking? Authorizing Provider  amLODipine (NORVASC) 10 MG tablet Take 10 mg by mouth daily.    [provider]  aspirin 81 MG tablet Take 81 mg by mouth every morning.     [provider]  buPROPion (WELLBUTRIN XL) 300 MG 24 hr tablet Take 300 mg by mouth daily. 12/12/15   [provider]  carvedilol (COREG) 25 MG tablet Take 1 tablet (25 mg total) by mouth 2 (two) times daily with a meal. 11/27/16   Hongalgi, Lenis Dickinson, MD  gabapentin (NEURONTIN) 100 MG capsule TAKE 2 CAPSULES (200 MG TOTAL) BY MOUTH AT BEDTIME. 06/29/18   Lyndal Pulley, DO  rosuvastatin (CRESTOR) 40 MG tablet Take 1 tablet (40 mg total) by mouth daily. 06/21/18   Belva Crome, MD     Family History  Problem Relation Age of Onset  . Lung cancer  Mother   . Cancer Mother        Lung  . AAA (abdominal aortic aneurysm) Father   . Heart disease Father        before age 36  . Heart disease Brother        before age 2    Social History   Socioeconomic History  . Marital status: Married    Spouse name: Not on file  . Number of children: Not on file  . Years of education: Not on file  . Highest education level: Not on file  Occupational History  . Not on file  Social Needs  . Financial resource strain: Not on file  . Food insecurity    Worry: Not on file    Inability: Not on file  . Transportation needs    Medical: Not on file    Non-medical: Not on file  Tobacco Use  . Smoking status: Never Smoker  . Smokeless tobacco: Never Used  Substance and Sexual Activity  . Alcohol use: Yes    Comment: occasional  . Drug use: No  .  Sexual activity: Never  Lifestyle  . Physical activity    Days per week: Not on file    Minutes per session: Not on file  . Stress: Not on file  Relationships  . Social Herbalist on phone: Not on file    Gets together: Not on file    Attends religious service: Not on file    Active member of club or organization: Not on file    Attends meetings of clubs or organizations: Not on file    Relationship status: Not on file  Other Topics Concern  . Not on file  Social History Narrative  . Not on file     Review of Systems: A 12 point ROS discussed and pertinent positives are indicated in the HPI above.  All other systems are negative.  Review of Systems  Vital Signs: BP (!) 149/95 (BP Location: Right Arm)   Pulse 61   Temp 98.4 F (36.9 C)   SpO2 97%   Physical Exam Vitals signs reviewed.  Constitutional:      Appearance: Normal appearance.  HENT:     Head: Normocephalic and atraumatic.  Eyes:     General: No scleral icterus. Cardiovascular:     Rate and Rhythm: Normal rate.  Pulmonary:     Effort: Pulmonary effort is normal.  Skin:    General: Skin is warm and  dry.  Neurological:     Mental Status: He is alert and oriented to person, place, and time.  Psychiatric:        Mood and Affect: Mood normal.        Behavior: Behavior normal.      Imaging: US Arterial Lower Extremity Duplex Bilateral  Result Date: 01/23/2019 CLINICAL DATA:  58 year old male with a history of aneurysm disease EXAM: NONINVASIVE PHYSIOLOGIC VASCULAR STUDY OF BILATERAL LOWER EXTREMITIES TECHNIQUE: Evaluation of both lower extremities was performed at rest, including calculation of ankle-brachial indices, and directed duplex COMPARISON:  Multiple prior CT, with prior noninvasive exam 05/18/2017 FINDINGS: Right ABI:  1.19 Left ABI:  1.25 Right Lower Extremity: Directed duplex of the right lower extremity demonstrates triphasic common femoral artery, profunda femoris, SFA. Triphasic popliteal artery, posterior tibial artery, anterior tibial artery. Fusiform aneurysm of the right popliteal artery measures 2.7 cm. Left Lower Extremity: Directed duplex of the left lower extremity demonstrates triphasic common femoral artery, profunda femoris, SFA, popliteal artery. Triphasic anterior tibial artery and posterior tibial artery. Fusiform aneurysm of the left popliteal artery measures 3.0 cm IMPRESSION: Resting ABI the bilateral lower extremities within normal limits, with triphasic waveforms maintained to the ankles. Right popliteal aneurysm measures 2.7 cm and left popliteal aneurysm measures 3.0 cm. Signed, Dulcy Fanny. Dellia Nims, RPVI [facility] Electronically Signed   By: Corrie Mckusick D.O.   On: 01/23/2019 09:42   Ir Radiologist Eval & Mgmt  Result Date: 01/23/2019 Please refer to notes tab for details about interventional procedure. (Op Note)  Ct Angio Abd/pel W/ And/or W/o  Result Date: 01/18/2019 CLINICAL DATA:  58 year old male with a history of abdominal aortic aneurysm status post endovascular aortic repair complicated by  type 2 endoleak now status post endovascular repair. Additionally, this gentleman has multifocal additional aneurysms. EXAM: CTA ABDOMEN AND PELVIS wITHOUT AND WITH CONTRAST TECHNIQUE: Multidetector CT imaging of the abdomen and pelvis was performed using the standard protocol during bolus administration of intravenous contrast. Multiplanar reconstructed images and MIPs were obtained and reviewed to evaluate the vascular  anatomy. CONTRAST:  137mL OMNIPAQUE IOHEXOL 350 MG/ML SOLN COMPARISON:  Multiple prior studies, most recent CT scan of the abdomen and pelvis 12/08/2017 FINDINGS: VASCULAR Aorta: Large fusiform infrarenal abdominal aortic aneurysm status post endovascular repair with a bifurcated endoprosthesis which extends from just below the renal arteries into the right external iliac artery and the left internal iliac artery. The extensive artifact present from prior onyx installation and coil embolization of the IMA and L4 and L5 lumbar arteries. No evidence of residual type 2 endoleak on either the arterial phase or delayed phase images. Despite absence of any visible endoleak, the excluded aneurysm sac continues to enlarge now measuring up to 8.8 x 8.8 cm compared to 8.1 x 8.1 cm in August of 2019. No evidence of type 1 a or 1b endoleak. Celiac: Patent without evidence of aneurysm, dissection, vasculitis or significant stenosis. SMA: Patent without evidence of aneurysm, dissection, vasculitis or significant stenosis. Renals: Both renal arteries are patent without evidence of aneurysm, dissection, vasculitis, fibromuscular dysplasia or significant stenosis. IMA: Coil occlusion at the origin. Inflow: Excluded right common iliac artery aneurysm. Plug embolization of the right internal iliac artery. The distal branches opacify via collateral flow. The right external iliac artery remains patent. Persistent left internal iliac artery aneurysm measuring 2.6 cm compared to 2.5 cm previously. Large aneurysm arising  from the left superior gluteal artery has substantially increased in size. The aneurysm is best evaluated on the coronal reformatted images were it now measures 6 x 5.9 cm compared to 3.9 x 3.9 cm. The left external iliac artery remains relatively spared from disease. Proximal Outflow: Bilateral common femoral and visualized portions of the superficial and profunda femoral arteries are patent without evidence of aneurysm, dissection, vasculitis or significant stenosis. Veins: No focal venous abnormality. Review of the MIP images confirms the above findings. NON-VASCULAR Lower chest: Stable cardiomegaly. The known right coronary artery aneurysm is not included in the field of view. The lung bases are clear. Hepatobiliary: Normal hepatic contour and morphology. No discrete hepatic lesions. Normal appearance of the gallbladder. No intra or extrahepatic biliary ductal dilatation. Pancreas: Unremarkable. No pancreatic ductal dilatation or surrounding inflammatory changes. Spleen: Normal in size without focal abnormality. Adrenals/Urinary Tract: Normal adrenal glands. No evidence of hydronephrosis, nephrolithiasis or enhancing renal mass. Stable small probable benign cyst at the posterior interpolar right kidney. The ureters and bladder are unremarkable. Stomach/Bowel: No focal bowel wall thickening or evidence of obstruction. Lymphatic: No suspicious lymphadenopathy. Reproductive: Prostate is unremarkable. Other: Small fat containing umbilical hernia. Musculoskeletal: No acute fracture or aggressive appearing lytic or blastic osseous lesion. Mild fatty atrophy of the medial aspect of the left gluteus medius muscle in the region of the superior gluteal artery aneurysm. IMPRESSION: VASCULAR 1. Marked interval enlargement of the left superior gluteal artery aneurysm which now measures up to 6 cm compared to 3.9 cm previously. 2. Fusiform abdominal aortic aneurysm status post endovascular aortic repair and type 2 endoleak  repair. Although no persistent endoleak is visible on today's examination, the excluded aneurysm sac continues to enlarged now measuring 8.8 x 8.8 cm compared 8.1 x 8.1 cm previously. 3. Stable to incrementally enlarged left internal iliac artery aneurysm at 2.6 cm compared to 2.5 cm previously. NON-VASCULAR 1. Ancillary findings as above without significant interval change. Signed, Criselda Peaches, MD, [place] [facility] Electronically Signed   By: Jacqulynn Cadet M.D.   On: 01/18/2019 09:46    Labs:  CBC: No results for input(s):  WBC, HGB, HCT, PLT in the last 8760 hours.  COAGS: No results for input(s): INR, APTT in the last 8760 hours.  BMP: Recent Labs    01/18/19 0733  CREATININE 1.20    LIVER FUNCTION TESTS: Recent Labs    10/05/18 0722 12/05/18 0734  BILITOT 0.3 0.2  AST 20 20  ALT 24 18  ALKPHOS 66 59  PROT 6.8 6.7  ALBUMIN 4.0 3.9    TUMOR MARKERS: No results for input(s): AFPTM, CEA, CA199, CHROMGRNA in the last 8760 hours.  Assessment and Plan:  While Daniel Blevins continues to do exceptionally well clinically and remains asymptomatic, he does have growth of multiple of his aneurysms including his excluded infrarenal abdominal aortic aneurysm despite the fact that no endoleak is visible by CT imaging.  Additionally, he has had rapid interval growth of his left superior gluteal aneurysm as well as a slower growth of his bilateral popliteal artery aneurysms.  His left popliteal artery now measures up to 3.0 cm while his right measures 2.7 cm.  This is close to the threshold for treatment.  The superior gluteal artery enlargement has been significant over the past 18 months.  I believe this aneurysm is at increased risk for rupture and will require treatment.  1.)  Schedule for aortic and left lower extremity arteriogram with planned combined coil and direct stick thrombin embolization of the left superior  gluteal aneurysm to be performed at [facility] by Dr. Laurence Ferrari.  Additionally, we will perform a diagnostic evaluation for any residual endoleak.  Depending on the results of the aortogram and graft evaluation, we may or may not need to proceed with additional intervention.  I will keep Dr. Oneida Alar updated of the results of our evaluation.  2.)  Bilateral popliteal artery aneurysms-continue surveillance.  When the aneurysms are larger than 3 cm, he will likely require surgical intervention.  Recommend repeat bilateral lower extremity duplex arterial ultrasound in 6 months.     Electronically Signed: Jacqulynn Cadet 01/23/2019, 12:31 PM   I spent a total of 15 Minutes in face to face in clinical consultation, greater than 50% of which was counseling/coordinating care for aortic, superior gluteal, popliteal and left hypogastric artery aneurysms.

## 2019-02-05 ENCOUNTER — Telehealth (HOSPITAL_COMMUNITY): Payer: Self-pay

## 2019-02-05 NOTE — Telephone Encounter (Signed)
Pt called GI very upset because no one had called to schedule his procedure. Spoke to [place] and she called him last week with no answer or vm. I called and informed him that Anderson Malta was working on his procedure and trying to get Dr. Laurence Ferrari scheduled over here. I left her a message to give him a call on Tuesday when she returns to work. Pt was fine with this plan.

## 2019-02-06 ENCOUNTER — Other Ambulatory Visit (HOSPITAL_COMMUNITY): Payer: Self-pay | Admitting: Interventional Radiology

## 2019-02-06 DIAGNOSIS — I729 Aneurysm of unspecified site: Secondary | ICD-10-CM

## 2019-02-14 ENCOUNTER — Other Ambulatory Visit: Payer: Self-pay | Admitting: Student

## 2019-02-14 ENCOUNTER — Other Ambulatory Visit: Payer: Self-pay | Admitting: Physician Assistant

## 2019-02-15 ENCOUNTER — Other Ambulatory Visit (HOSPITAL_COMMUNITY): Payer: Self-pay | Admitting: Interventional Radiology

## 2019-02-15 ENCOUNTER — Other Ambulatory Visit: Payer: Self-pay

## 2019-02-15 ENCOUNTER — Ambulatory Visit (HOSPITAL_COMMUNITY)
Admission: RE | Admit: 2019-02-15 | Discharge: 2019-02-15 | Disposition: A | Discharge status: Home / Self Care | Payer: No Typology Code available for payment source | Source: Ambulatory Visit | Attending: Interventional Radiology | Admitting: Interventional Radiology

## 2019-02-15 ENCOUNTER — Encounter (HOSPITAL_COMMUNITY): Payer: Self-pay

## 2019-02-15 DIAGNOSIS — Z7982 Long term (current) use of aspirin: Secondary | ICD-10-CM | POA: Diagnosis not present

## 2019-02-15 DIAGNOSIS — Z9884 Bariatric surgery status: Secondary | ICD-10-CM | POA: Diagnosis not present

## 2019-02-15 DIAGNOSIS — I2510 Atherosclerotic heart disease of native coronary artery without angina pectoris: Secondary | ICD-10-CM | POA: Insufficient documentation

## 2019-02-15 DIAGNOSIS — I729 Aneurysm of unspecified site: Secondary | ICD-10-CM

## 2019-02-15 DIAGNOSIS — Z79899 Other long term (current) drug therapy: Secondary | ICD-10-CM | POA: Insufficient documentation

## 2019-02-15 DIAGNOSIS — Z95828 Presence of other vascular implants and grafts: Secondary | ICD-10-CM | POA: Insufficient documentation

## 2019-02-15 DIAGNOSIS — I724 Aneurysm of artery of lower extremity: Secondary | ICD-10-CM | POA: Insufficient documentation

## 2019-02-15 DIAGNOSIS — E785 Hyperlipidemia, unspecified: Secondary | ICD-10-CM | POA: Insufficient documentation

## 2019-02-15 DIAGNOSIS — H9191 Unspecified hearing loss, right ear: Secondary | ICD-10-CM | POA: Insufficient documentation

## 2019-02-15 DIAGNOSIS — N179 Acute kidney failure, unspecified: Secondary | ICD-10-CM | POA: Insufficient documentation

## 2019-02-15 DIAGNOSIS — N182 Chronic kidney disease, stage 2 (mild): Secondary | ICD-10-CM | POA: Diagnosis not present

## 2019-02-15 DIAGNOSIS — I714 Abdominal aortic aneurysm, without rupture: Secondary | ICD-10-CM | POA: Diagnosis present

## 2019-02-15 DIAGNOSIS — Z951 Presence of aortocoronary bypass graft: Secondary | ICD-10-CM | POA: Diagnosis not present

## 2019-02-15 DIAGNOSIS — I129 Hypertensive chronic kidney disease with stage 1 through stage 4 chronic kidney disease, or unspecified chronic kidney disease: Secondary | ICD-10-CM | POA: Diagnosis not present

## 2019-02-15 HISTORY — PX: IR ANGIOGRAM SELECTIVE EACH ADDITIONAL VESSEL: IMG667

## 2019-02-15 HISTORY — PX: IR US GUIDE VASC ACCESS LEFT: IMG2389

## 2019-02-15 HISTORY — PX: IR EMBO ARTERIAL NOT HEMORR HEMANG INC GUIDE ROADMAPPING: IMG5448

## 2019-02-15 HISTORY — PX: IR ANGIOGRAM PELVIS SELECTIVE OR SUPRASELECTIVE: IMG661

## 2019-02-15 HISTORY — PX: IR ANGIOGRAM EXTREMITY BILATERAL: IMG653

## 2019-02-15 LAB — BASIC METABOLIC PANEL
Anion gap: 10 (ref 5–15)
BUN: 19 mg/dL (ref 6–20)
CO2: 25 mmol/L (ref 22–32)
Calcium: 8.1 mg/dL — ABNORMAL LOW (ref 8.9–10.3)
Chloride: 106 mmol/L (ref 98–111)
Creatinine, Ser: 1.29 mg/dL — ABNORMAL HIGH (ref 0.61–1.24)
GFR calc Af Amer: >60 mL/min (ref >60)
GFR calc non Af Amer: >60 mL/min (ref >60)
Glucose, Bld: 112 mg/dL — ABNORMAL HIGH (ref 70–99)
Potassium: 3.5 mmol/L (ref 3.5–5.1)
Sodium: 141 mmol/L (ref 135–145)

## 2019-02-15 LAB — CBC
HCT: 35.8 % — ABNORMAL LOW (ref 39.0–52.0)
Hemoglobin: 11 g/dL — ABNORMAL LOW (ref 13.0–17.0)
MCH: 24.6 pg — ABNORMAL LOW (ref 26.0–34.0)
MCHC: 30.7 g/dL (ref 30.0–36.0)
MCV: 79.9 fL — ABNORMAL LOW (ref 80.0–100.0)
Platelets: 162 10*3/uL (ref 150–400)
RBC: 4.48 MIL/uL (ref 4.22–5.81)
RDW: 15.6 % — ABNORMAL HIGH (ref 11.5–15.5)
WBC: 4.6 10*3/uL (ref 4.0–10.5)
nRBC: 0 % (ref 0.0–0.2)

## 2019-02-15 LAB — PROTIME-INR
INR: 1.2 (ref 0.8–1.2)
Prothrombin Time: 14.6 seconds (ref 11.4–15.2)

## 2019-02-15 MED ORDER — MIDAZOLAM HCL 2 MG/2ML IJ SOLN
INTRAMUSCULAR | Status: AC
  Filled 2019-02-15: qty 2

## 2019-02-15 MED ORDER — HEPARIN SODIUM (PORCINE) 1000 UNIT/ML IJ SOLN
INTRAMUSCULAR | Status: AC
  Filled 2019-02-15: qty 1

## 2019-02-15 MED ORDER — MIDAZOLAM HCL 2 MG/2ML IJ SOLN
INTRAMUSCULAR | Status: AC | PRN
  Administered 2019-02-15: 0.5 mg via INTRAVENOUS
  Administered 2019-02-15: 1 mg via INTRAVENOUS
  Administered 2019-02-15 (×3): 0.5 mg via INTRAVENOUS

## 2019-02-15 MED ORDER — SODIUM CHLORIDE 0.9 % IV SOLN: INTRAVENOUS | Status: DC

## 2019-02-15 MED ORDER — IODIXANOL 320 MG/ML IV SOLN
100.0000 mL | Freq: Once | INTRAVENOUS | Status: AC | PRN
  Administered 2019-02-15: 12:00:00 70 mL via INTRA_ARTERIAL

## 2019-02-15 MED ORDER — FENTANYL CITRATE (PF) 100 MCG/2ML IJ SOLN
INTRAMUSCULAR | Status: AC | PRN
  Administered 2019-02-15: 25 ug via INTRAVENOUS
  Administered 2019-02-15: 50 ug via INTRAVENOUS
  Administered 2019-02-15 (×2): 25 ug via INTRAVENOUS

## 2019-02-15 MED ORDER — FENTANYL CITRATE (PF) 100 MCG/2ML IJ SOLN
INTRAMUSCULAR | Status: AC
  Filled 2019-02-15: qty 2

## 2019-02-15 MED ORDER — IODIXANOL 320 MG/ML IV SOLN
100.0000 mL | Freq: Once | INTRAVENOUS | Status: AC | PRN
  Administered 2019-02-15: 50 mL via INTRA_ARTERIAL

## 2019-02-15 MED ORDER — GELATIN ABSORBABLE 12-7 MM EX MISC
CUTANEOUS | Status: AC
  Filled 2019-02-15: qty 1

## 2019-02-15 MED ORDER — IODIXANOL 320 MG/ML IV SOLN
100.0000 mL | Freq: Once | INTRAVENOUS | Status: AC | PRN
  Administered 2019-02-15: 12:00:00 30 mL via INTRA_ARTERIAL

## 2019-02-15 MED ORDER — LIDOCAINE HCL 1 % IJ SOLN
INTRAMUSCULAR | Status: AC
  Filled 2019-02-15: qty 20

## 2019-02-15 MED ORDER — HEPARIN SODIUM (PORCINE) 1000 UNIT/ML IJ SOLN
INTRAMUSCULAR | Status: AC | PRN
  Administered 2019-02-15: 3000 [IU] via INTRAVENOUS

## 2019-02-15 MED ORDER — LIDOCAINE HCL (PF) 1 % IJ SOLN
INTRAMUSCULAR | Status: AC | PRN
  Administered 2019-02-15: 10 mL

## 2019-02-15 MED ORDER — THROMBIN FOR PERCUTANEOUS TREATMENT OF PSEUDOANEURYSM (5000UNITS/10ML)
Freq: Once | PERCUTANEOUS | Status: DC
  Filled 2019-02-15: qty 1

## 2019-02-15 NOTE — Procedures (Signed)
Interventional Radiology Procedure Note  Procedure:  1.) Diagnostic angiogram of aorta 2.) BLE diagnostic angiogram 3.) Coil embolization left superior gluteal aneurysm.   Complications: None  Estimated Blood Loss: None  Recommendations: - Bedrest x 4 hrs - DC home  Signed,  Criselda Peaches, MD

## 2019-02-15 NOTE — H&P (Signed)
Referring Physician(s): Pershing Proud  Supervising Physician: Jacqulynn Cadet  Patient Status:  [facility] OP  Chief Complaint: Pelvic/back pain/multiple arterial aneurysms   Subjective:  58 y.o. male who underwentAAA repair with Dr. Oneida Alar in 2015. He subsequently developed an endoleak and was referred to interventional radiology for management. He underwent sucessful embolization back in 2017, however developed abdominal pain with associated CT findings demonstrating further progression of his endoleak.Hesubsequentlyunderwent direct stick endoleak repair on 05/20/17. Patient tolerated the procedure well and was able to discharge home the same day. 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  He does have growth of multiple of his aneurysms including his excluded infrarenal abdominal aortic aneurysm despite the fact that endoleak is not visible by CT. he also has rapid interval growth of his left superior gluteal aneurysm as well as a slower growth of his bilateral popliteal artery aneurysms.  The superior gluteal artery enlargement has been significant over the past 18 months.  He continues to have some chronic pelvic as well as lower back discomfort.  He denies fever, headache, chest pain, dyspnea, cough, nausea, vomiting or bleeding.  Following discussions with Dr. Laurence Ferrari he presents today for aortic and left lower extremity arteriogram with plan for combined coil and direct stick thrombin embolization of the left superior gluteal aneurysm.  Additional evaluation will be performed to exclude any  residual endoleak  as well as any additional endovascular intervention as deemed necessary.  Additional medical history as below.  Past Medical History:  Diagnosis Date   AAA (abdominal aortic aneurysm) ([place])    a. s/p EVAR 2015 with endoleak treated by IR.   AKI (acute kidney injury) ([place])    Chest pain    CKD (chronic kidney disease), stage II    Coronary artery disease    a. CABG 2001 at [facility]. b. Cath 06/2017 to evaluate coronary aneurysm, workup in progress as of 06/2017.   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   Past Surgical History:  Procedure Laterality Date   ABDOMINAL AORTIC ENDOVASCULAR STENT GRAFT N/A 04/30/2014   Procedure: ABDOMINAL AORTIC ENDOVASCULAR STENT GRAFT;  Surgeon: Elam Dutch, MD;  Location: [place];  Service: Vascular;  Laterality: N/A;   ABDOMINAL AORTIC ENDOVASCULAR STENT GRAFT N/A 05/26/2015   Procedure: ABDOMINAL AORTIC ENDOVASCULAR STENT GRAFT RE-INTERVENTION; REPAIR OF PROXIMAL CUFF;  Surgeon: Elam Dutch, MD;  Location: [place];  Service: Vascular;  Laterality: N/A;   BACK SURGERY  2009   lower back   CARDIAC CATHETERIZATION     COLONOSCOPY     CORONARY ARTERY BYPASS GRAFT  2001   CORONARY STENT INTERVENTION N/A 07/15/2017   Procedure: CORONARY STENT INTERVENTION;  Surgeon: Daneen Schick  W, MD;  Location: [place] [facility];  Service: Cardiovascular;  Laterality: N/A;   CYSTOSCOPY WITH RETROGRADE PYELOGRAM, URETEROSCOPY AND STENT PLACEMENT Bilateral 02/07/2014   Procedure: CYSTOSCOPY WITH BILATERAL RETROGRADE PYELOGRAM, BILATERAL  URETEROSCOPY, RIGHT EXTRACTION OF STONES AND RIGHT STENT PLACEMENT;  Surgeon: Jorja Loa, MD;  Location: [facility];  Service: Urology;  Laterality: Bilateral;   ESOPHAGOGASTRODUODENOSCOPY     IR AORTAGRAM ABDOMINAL SERIALOGRAM  05/20/2017   IR CT SPINE LTD  05/20/2017   IR EMBO ARTERIAL NOT HEMORR HEMANG INC GUIDE ROADMAPPING  05/20/2017   IR GENERIC HISTORICAL  11/06/2015   IR RADIOLOGIST EVAL & MGMT 11/06/2015 Jacqulynn Cadet, MD [facility] INTERV RAD   IR GENERIC HISTORICAL  04/20/2016   IR RADIOLOGIST EVAL & MGMT 04/20/2016 Jacqulynn Cadet, MD [facility] INTERV RAD   IR RADIOLOGIST EVAL & MGMT  05/11/2017   IR RADIOLOGIST EVAL & MGMT  06/22/2017   IR RADIOLOGIST EVAL & MGMT  12/08/2017   IR RADIOLOGIST EVAL & MGMT  01/23/2019   LAPAROSCOPIC GASTRIC BYPASS  2010   [facility]   LAPAROSCOPIC LYSIS OF ADHESIONS N/A 03/06/2013   Procedure: LAPAROSCOPIC LYSIS OF ADHESIONS AND CLOSURE OF INTERNAL HERNIAS x2;  Surgeon: Adin Hector, MD;  Location: [facility];  Service: General;  Laterality: N/A;   LAPAROSCOPY N/A 03/06/2013   Procedure: LAPAROSCOPY DIAGNOSTIC;  Surgeon: Adin Hector, MD;  Location: [facility];  Service: General;  Laterality: N/A;   LEFT HEART CATH AND CORS/GRAFTS ANGIOGRAPHY N/A 06/09/2017   Procedure: LEFT HEART CATH AND CORS/GRAFTS ANGIOGRAPHY;  Surgeon: Belva Crome, MD;  Location: [place] [facility];  Service: Cardiovascular;  Laterality: N/A;   PERIPHERAL VASCULAR CATHETERIZATION Right 04/30/2014   Procedure: EMBOLIZATION right internal iliac;  Surgeon: Elam Dutch, MD;  Location: [place];  Service: Vascular;  Laterality: Right;   RADIOLOGY WITH ANESTHESIA N/A 05/20/2017   Procedure: Endoleak Repair;  Surgeon: Jacqulynn Cadet, MD;  Location: [place];  Service: Radiology;  Laterality: N/A;      Allergies: Patient has no known allergies.  Medications: Prior to Admission medications   Medication Sig Start Date End Date Taking? Authorizing Provider  amLODipine  (NORVASC) 10 MG tablet Take 10 mg by mouth at bedtime.    Yes [provider]  aspirin 81 MG tablet Take 81 mg by mouth every morning.    Yes [provider]  buPROPion (WELLBUTRIN XL) 150 MG 24 hr tablet Take 150 mg by mouth daily.  12/12/15  Yes [provider]  carvedilol (COREG) 25 MG tablet Take 1 tablet (25 mg total) by mouth 2 (two) times daily with a meal. 11/27/16  Yes Hongalgi, Lenis Dickinson, MD  celecoxib (CELEBREX) 200 MG capsule Take 200 mg by mouth daily.   Yes [provider]  Cholecalciferol (VITAMIN D) 50 MCG (2000 UT) CAPS Take 2,000 Units by mouth daily.   Yes [provider]  Omega-3 Fatty Acids (FISH OIL TRIPLE STRENGTH) 1400 MG CAPS Take 1,400 mg by mouth daily.   Yes [provider]  OVER THE COUNTER MEDICATION Take 2 capsules by mouth daily.   Yes [provider]  rosuvastatin (CRESTOR) 40 MG tablet Take 1 tablet (40 mg total) by mouth daily. 06/21/18  Yes Belva Crome, MD  traZODone (DESYREL) 100 MG tablet Take 100 mg by mouth at bedtime.   Yes [provider]  Turmeric 500 MG CAPS Take 500 mg by mouth daily.   Yes [provider]  vitamin B-12 (CYANOCOBALAMIN)  1000 MCG tablet Take 1,000 mcg by mouth daily.   Yes [provider]  gabapentin (NEURONTIN) 100 MG capsule TAKE 2 CAPSULES (200 MG TOTAL) BY MOUTH AT BEDTIME. Patient not taking: Reported on 02/12/2019 06/29/18   Lyndal Pulley, DO     Vital Signs: BP (!) 152/105    Pulse (!) 56    Temp 98.1 F (36.7 C) (Oral)    Resp 16    Ht 5\' 11"  (1.803 m)    Wt 260 lb (117.9 kg)    SpO2 100%    BMI 36.26 kg/m   Physical Exam awake, alert.  Chest clear to auscultation bilaterally.  Heart with bradycardic rhythm with some ectopy/pauses noted.  Abdomen soft, positive bowel sounds, mild lower abdominal/pelvic tenderness to palpation.  No lower extremity edema.  Imaging: No results found.  Labs:  CBC: Recent Labs    02/15/19 0640  WBC  4.6  HGB 11.0*  HCT 35.8*  PLT 162    COAGS: Recent Labs    02/15/19 0640  INR 1.2    BMP: Recent Labs    01/18/19 0733 02/15/19 0640  NA  --  141  K  --  3.5  CL  --  106  CO2  --  25  GLUCOSE  --  112*  BUN  --  19  CALCIUM  --  8.1*  CREATININE 1.20 1.29*  GFRNONAA  --  >60  GFRAA  --  >60    LIVER FUNCTION TESTS: Recent Labs    10/05/18 0722 12/05/18 0734  BILITOT 0.3 0.2  AST 20 20  ALT 24 18  ALKPHOS 66 59  PROT 6.8 6.7  ALBUMIN 4.0 3.9    Assessment and Plan: Patient with history of AAA with prior endoleak repair in 2015, chronic kidney disease, coronary artery disease with prior CABG, hypertension, sleep apnea, multiple arterial aneurysms, including left superior gluteal/bilateral popliteal; has evidence of interval growth in the left superior gluteal aneurysm as well as bilateral popliteal artery aneurysms and AAA.  Scheduled today for aortic and left lower extremity arteriogram with planned combined cortical and direct stick thrombin embolization of the left superior gluteal aneurysm, abdominal aortogram to evaluate for any residual AAA endoleak along with any additional necessary endovascular intervention as deemed necessary.Risks and benefits of procedure were discussed with the patient including, but not limited to bleeding, infection, vascular injury or contrast induced renal failure.  This interventional procedure involves the use of X-rays and because of the nature of the planned procedure, it is possible that we will have prolonged use of X-ray fluoroscopy.  Potential radiation risks to you include (but are not limited to) the following: - A slightly elevated risk for cancer  several years later in life. This risk is typically less than 0.5% percent. This risk is low in comparison to the normal incidence of human cancer, which is 33% for women and 50% for men according to the [employer]. - Radiation induced injury can include skin  redness, resembling a rash, tissue breakdown / ulcers and hair loss (which can be temporary or permanent).   The likelihood of either of these occurring depends on the difficulty of the procedure and whether you are sensitive to radiation due to previous procedures, disease, or genetic conditions.   IF your procedure requires a prolonged use of radiation, you will be notified and given written instructions for further action.  It is your responsibility to monitor the irradiated area for the 2 weeks  following the procedure and to notify your physician if you are concerned that you have suffered a radiation induced injury.    All of the patient's questions were answered, patient is agreeable to proceed.  Consent signed and in chart.  Labs today include WBC 4.6, hemoglobin 11, platelets 162k, calcium 3.5, creatinine 1.29, PT 14.6, INR 1.2    Electronically Signed: D. Rowe Robert, PA-C 02/15/2019, 8:45 AM   I spent a total of 25 minutes at the the patient's bedside AND on the patient's hospital floor or unit, greater than 50% of which was counseling/coordinating care for aortic/left lower extremity arteriogram with endovascular intervention

## 2019-02-15 NOTE — Discharge Instructions (Signed)
Femoral Site Care This sheet gives you information about how to care for yourself after your procedure. Your health care provider may also give you more specific instructions. If you have problems or questions, contact your health care provider. What can I expect after the procedure? After the procedure, it is common to have:  Bruising that usually fades within 1-2 weeks.  Tenderness at the site. Follow these instructions at home: Wound care  Follow instructions from your health care provider about how to take care of your insertion site. Make sure you: ? Wash your hands with soap and water before you change your bandage (dressing). If soap and water are not available, use hand sanitizer. ? Change your dressing as told by your health care provider. ? Leave stitches (sutures), skin glue, or adhesive strips in place. These skin closures may need to stay in place for 2 weeks or longer. If adhesive strip edges start to loosen and curl up, you may trim the loose edges. Do not remove adhesive strips completely unless your health care provider tells you to do that.  Do not take baths, swim, or use a hot tub until your health care provider approves.  You may shower 24-48 hours after the procedure or as told by your health care provider. ? Gently wash the site with plain soap and water. ? Pat the area dry with a clean towel. ? Do not rub the site. This may cause bleeding.  Do not apply powder or lotion to the site. Keep the site clean and dry.  Check your femoral site every day for signs of infection. Check for: ? Redness, swelling, or pain. ? Fluid or blood. ? Warmth. ? Pus or a bad smell. Activity  For the first 2-3 days after your procedure, or as long as directed: ? Avoid climbing stairs as much as possible. ? Do not squat.  Do not lift anything that is heavier than 10 lb (4.5 kg), or the limit that you are told, until your health care provider says that it is safe.  Rest as  directed. ? Avoid sitting for a long time without moving. Get up to take short walks every 1-2 hours.  Do not drive for 24 hours if you were given a medicine to help you relax (sedative). General instructions  Take over-the-counter and prescription medicines only as told by your health care provider.  Keep all follow-up visits as told by your health care provider. This is important. Contact a health care provider if you have:  A fever or chills.  You have redness, swelling, or pain around your insertion site. Get help right away if:  The catheter insertion area swells very fast.  You pass out.  You suddenly start to sweat or your skin gets clammy.  The catheter insertion area is bleeding, and the bleeding does not stop when you hold steady pressure on the area.  The area near or just beyond the catheter insertion site becomes pale, cool, tingly, or numb. These symptoms may represent a serious problem that is an emergency. Do not wait to see if the symptoms will go away. Get medical help right away. Call your local emergency services (911 in the [place] Do not drive yourself to the hospital. Summary  After the procedure, it is common to have bruising that usually fades within 1-2 weeks.  Check your femoral site every day for signs of infection.  Do not lift anything that is heavier than 10 lb (4.5 kg), or  limit that you are told, until your health care provider says that it is safe. °This information is not intended to replace advice given to you by your health care provider. Make sure you discuss any questions you have with your health care provider. °Document Released: 12/21/2013 Document Revised: 05/02/2017 Document Reviewed: 05/02/2017 °Elsevier Patient Education © 2020 [employer] ° ° ° °Moderate Conscious Sedation, Adult, Care After °These instructions provide you with information about caring for yourself after your procedure. Your health care provider may also give you  more specific instructions. Your treatment has been planned according to current medical practices, but problems sometimes occur. Call your health care provider if you have any problems or questions after your procedure. °What can I expect after the procedure? °After your procedure, it is common: °· To feel sleepy for several hours. °· To feel clumsy and have poor balance for several hours. °· To have poor judgment for several hours. °· To vomit if you eat too soon. °Follow these instructions at home: °For at least 24 hours after the procedure: ° °· Do not: °? Participate in activities where you could fall or become injured. °? Drive. °? Use heavy machinery. °? Drink alcohol. °? Take sleeping pills or medicines that cause drowsiness. °? Make important decisions or sign legal documents. °? Take care of children on your own. °· Rest. °Eating and drinking °· Follow the diet recommended by your health care provider. °· If you vomit: °? Drink water, juice, or soup when you can drink without vomiting. °? Make sure you have little or no nausea before eating solid foods. °General instructions °· Have a responsible adult stay with you until you are awake and alert. °· Take over-the-counter and prescription medicines only as told by your health care provider. °· If you smoke, do not smoke without supervision. °· Keep all follow-up visits as told by your health care provider. This is important. °Contact a health care provider if: °· You keep feeling nauseous or you keep vomiting. °· You feel light-headed. °· You develop a rash. °· You have a fever. °Get help right away if: °· You have trouble breathing. °This information is not intended to replace advice given to you by your health care provider. Make sure you discuss any questions you have with your health care provider. °Document Released: 02/07/2013 Document Revised: 04/01/2017 Document Reviewed: 08/09/2015 °Elsevier Patient Education © 2020 [employer] ° °

## 2019-02-15 NOTE — Progress Notes (Signed)
No bleeding or swelling noted to groin after ambulation

## 2019-02-16 ENCOUNTER — Telehealth: Payer: Self-pay | Admitting: Vascular Surgery

## 2019-02-16 ENCOUNTER — Encounter (HOSPITAL_COMMUNITY): Payer: Self-pay

## 2019-02-16 NOTE — Telephone Encounter (Signed)
Case discussed yesterday with Dr Laurence Ferrari.  Pt has 8.8 cm AAA was 6 cm prior to stent repair in 2016.  Has had proximal cuff as well as embolization of several endoleaks in the past.  Angio currently shows no endoleak as well as CT.  With continued growth I believe we need to consider explant stent   He also has bilateral popliteal aneurysms that are now of size to consider repair  I called and pt and left voicemail message.  Our office will try to schedule appt next week  [name], MD [facility] Office: [phone] Pager: [phone]

## 2019-02-22 ENCOUNTER — Ambulatory Visit (INDEPENDENT_AMBULATORY_CARE_PROVIDER_SITE_OTHER): Payer: 59 | Admitting: Vascular Surgery

## 2019-02-22 ENCOUNTER — Other Ambulatory Visit: Payer: Self-pay

## 2019-02-22 ENCOUNTER — Encounter: Payer: Self-pay | Admitting: Vascular Surgery

## 2019-02-22 VITALS — BP 130/87 | HR 65 | Temp 97.3°F | Resp 20 | Ht 71.0 in | Wt 260.0 lb

## 2019-02-22 DIAGNOSIS — I714 Abdominal aortic aneurysm, without rupture, unspecified: Secondary | ICD-10-CM

## 2019-02-22 DIAGNOSIS — I724 Aneurysm of artery of lower extremity: Secondary | ICD-10-CM | POA: Diagnosis not present

## 2019-02-22 NOTE — Progress Notes (Signed)
Patient name: Daniel Blevins MRN: [id number] DOB: 10/05/1960 Sex: male  HPI: Daniel Blevins is a 58 y.o. male, who underwent repair of a 6 cm infrarenal abdominal aortic aneurysm December 2015.  He underwent preoperative right internal iliac artery coil embolization.  The aneurysm continued to expand and a proximal cuff was placed in January 2017.  This was done for a proximal type I endoleak documented on angiogram.  He underwent embolization of a type II endoleak in 2017.  This involved coil embolization of paired L4 lumbar arteries and a right L3 lumbar artery as well as the origin of the inferior mesenteric artery.  The aneurysm continue to expand and in January 2019 he underwent direct sac puncture and Onyx embolization of an L2 lumbar artery.  The aneurysm continued to expand.  In summary his abdominal aortic aneurysm was 6 cm in diameter November 2015, 6.2 cm in diameter January 2016, 6.5 cm December 2016, 6.4 cm February 2017, 6.5 cm diameter July 2017, 6.8 cm diameter December 2017, 7.7 cm December 2018, 8 cm March 2019, 8.4 cm August 2019, 8.8 cm September 2020.  The patient currently has no abdominal or back pain.  Other medical problems include prior history of coronary artery aneurysm, bilateral popliteal aneurysms, recently treated gluteal artery aneurysm.  He also has CKD stage II, hyperlipidemia hypertension all of which are currently stable.  He does have significant family history of multiple aneurysms in his father.  His past surgical history is remarkable for prior gastric bypass.  He is currently on aspirin and a statin.  Past Medical History:  Diagnosis Date   AAA (abdominal aortic aneurysm) ([place])    a. s/p EVAR 2015 with endoleak treated by IR.   AKI (acute kidney injury) ([place])    Chest pain    CKD (chronic kidney disease), stage II    Coronary artery disease    a. CABG 2001 at [facility] b. Cath 06/2017 to evaluate coronary aneurysm, workup in progress as of 06/2017.    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   Past Surgical History:  Procedure Laterality Date   ABDOMINAL AORTIC ENDOVASCULAR STENT GRAFT N/A 04/30/2014   Procedure: ABDOMINAL AORTIC ENDOVASCULAR STENT GRAFT;  Surgeon: Elam Dutch, MD;  Location: [facility];  Service: Vascular;  Laterality: N/A;   ABDOMINAL AORTIC ENDOVASCULAR STENT GRAFT N/A 05/26/2015   Procedure: ABDOMINAL AORTIC ENDOVASCULAR STENT GRAFT RE-INTERVENTION; REPAIR OF PROXIMAL CUFF;  Surgeon: Elam Dutch, MD;  Location: [facility];  Service: Vascular;  Laterality: N/A;   BACK SURGERY  2009   lower back   CARDIAC CATHETERIZATION     COLONOSCOPY     CORONARY ARTERY BYPASS GRAFT  2001   CORONARY STENT INTERVENTION N/A 07/15/2017   Procedure: CORONARY STENT INTERVENTION;  Surgeon: Belva Crome, MD;  Location: [place] [facility];  Service: Cardiovascular;  Laterality: N/A;   CYSTOSCOPY WITH RETROGRADE PYELOGRAM, URETEROSCOPY AND STENT PLACEMENT Bilateral 02/07/2014   Procedure: CYSTOSCOPY WITH BILATERAL RETROGRADE PYELOGRAM, BILATERAL  URETEROSCOPY, RIGHT EXTRACTION OF STONES AND RIGHT STENT PLACEMENT;  Surgeon: Jorja Loa, MD;  Location: [facility];  Service: Urology;  Laterality: Bilateral;   ESOPHAGOGASTRODUODENOSCOPY     IR ANGIOGRAM EXTREMITY BILATERAL  02/15/2019   IR ANGIOGRAM PELVIS SELECTIVE OR SUPRASELECTIVE  02/15/2019   IR ANGIOGRAM SELECTIVE EACH ADDITIONAL VESSEL  02/15/2019   IR ANGIOGRAM SELECTIVE EACH ADDITIONAL VESSEL  02/15/2019   IR AORTAGRAM ABDOMINAL SERIALOGRAM  05/20/2017   IR CT SPINE LTD   05/20/2017   IR EMBO ARTERIAL NOT HEMORR HEMANG INC GUIDE ROADMAPPING  05/20/2017   IR EMBO ARTERIAL NOT HEMORR HEMANG INC GUIDE ROADMAPPING  02/15/2019   IR GENERIC HISTORICAL  11/06/2015   IR RADIOLOGIST EVAL & MGMT 11/06/2015 Jacqulynn Cadet, MD [facility]   IR GENERIC HISTORICAL  04/20/2016   IR RADIOLOGIST EVAL & MGMT 04/20/2016 Jacqulynn Cadet, MD [facility]   IR RADIOLOGIST EVAL & MGMT  05/11/2017   IR RADIOLOGIST EVAL & MGMT  06/22/2017   IR RADIOLOGIST EVAL & MGMT  12/08/2017   IR RADIOLOGIST EVAL & MGMT  01/23/2019   IR US GUIDE VASC ACCESS LEFT  02/15/2019   LAPAROSCOPIC GASTRIC BYPASS  2010   [facility]   LAPAROSCOPIC LYSIS OF ADHESIONS N/A 03/06/2013   Procedure: LAPAROSCOPIC LYSIS OF ADHESIONS AND CLOSURE OF INTERNAL HERNIAS x2;  Surgeon: Adin Hector, MD;  Location: [facility];  Service: General;  Laterality: N/A;   LAPAROSCOPY N/A 03/06/2013   Procedure: LAPAROSCOPY DIAGNOSTIC;  Surgeon: Adin Hector, MD;  Location: [facility];  Service: General;  Laterality: N/A;   LEFT HEART CATH AND CORS/GRAFTS ANGIOGRAPHY N/A 06/09/2017   Procedure: LEFT HEART CATH AND CORS/GRAFTS ANGIOGRAPHY;  Surgeon: Belva Crome, MD;  Location: [place] [facility];  Service: Cardiovascular;  Laterality: N/A;   PERIPHERAL VASCULAR CATHETERIZATION Right 04/30/2014   Procedure: EMBOLIZATION right internal iliac;  Surgeon: Elam Dutch, MD;  Location: [place];  Service: Vascular;  Laterality: Right;   RADIOLOGY WITH ANESTHESIA N/A 05/20/2017   Procedure: Endoleak Repair;  Surgeon: Jacqulynn Cadet, MD;  Location: [place];  Service: Radiology;  Laterality: N/A;    Family History  Problem Relation Age of Onset   Lung cancer Mother    Cancer Mother        Lung   AAA (abdominal aortic aneurysm) Father    Heart disease Father        before age 38   Heart disease Brother        before age 60    SOCIAL HISTORY: Social History   Socioeconomic History   Marital status:  Married    Spouse name: Not on file   Number of children: Not on file   Years of education: Not on file   Highest education level: Not on file  Occupational History   Not on file  Social Needs   Financial resource strain: Not on file   Food insecurity    Worry: Not on file    Inability: Not on file   Transportation needs    Medical: Not on file    Non-medical: Not on file  Tobacco Use   Smoking status: Never Smoker   Smokeless tobacco: Never Used  Substance and Sexual Activity   Alcohol use: Yes    Comment: occasional   Drug use: No   Sexual activity: Never  Lifestyle   Physical activity    Days per week: Not on file    Minutes per session: Not  on file   Stress: Not on file  Relationships   Social connections    Talks on phone: Not on file    Gets together: Not on file    Attends religious service: Not on file    Active member of club or organization: Not on file    Attends meetings of clubs or organizations: Not on file    Relationship status: Not on file   Intimate partner violence    Fear of current or ex partner: Not on file    Emotionally abused: Not on file    Physically abused: Not on file    Forced sexual activity: Not on file  Other Topics Concern   Not on file  Social History Narrative   Not on file    No Known Allergies  Current Outpatient Medications  Medication Sig Dispense Refill   amLODipine (NORVASC) 10 MG tablet Take 10 mg by mouth at bedtime.      aspirin 81 MG tablet Take 81 mg by mouth every morning.      buPROPion (WELLBUTRIN XL) 150 MG 24 hr tablet Take 150 mg by mouth daily.      carvedilol (COREG) 25 MG tablet Take 1 tablet (25 mg total) by mouth 2 (two) times daily with a meal. 60 tablet 0   celecoxib (CELEBREX) 200 MG capsule Take 200 mg by mouth daily.     Cholecalciferol (VITAMIN D) 50 MCG (2000 UT) CAPS Take 2,000 Units by mouth daily.     Omega-3 Fatty Acids (FISH OIL TRIPLE STRENGTH) 1400 MG CAPS Take  1,400 mg by mouth daily.     OVER THE COUNTER MEDICATION Take 2 capsules by mouth daily.     rosuvastatin (CRESTOR) 40 MG tablet Take 1 tablet (40 mg total) by mouth daily. 90 tablet 2   traZODone (DESYREL) 100 MG tablet Take 100 mg by mouth at bedtime.     Turmeric 500 MG CAPS Take 500 mg by mouth daily.     vitamin B-12 (CYANOCOBALAMIN) 1000 MCG tablet Take 1,000 mcg by mouth daily.     No current facility-administered medications for this visit.     ROS:   General:  No weight loss, Fever, chills  HEENT: No recent headaches, no nasal bleeding, no visual changes, no sore throat  Neurologic: No dizziness, blackouts, seizures. No recent symptoms of stroke or mini- stroke. No recent episodes of slurred speech, or temporary blindness.  Cardiac: No recent episodes of chest pain/pressure, no shortness of breath at rest.  No shortness of breath with exertion.  Denies history of atrial fibrillation or irregular heartbeat  Vascular: No history of rest pain in feet.  No history of claudication.  No history of non-healing ulcer, No history of DVT   Pulmonary: No home oxygen, no productive cough, no hemoptysis,  No asthma or wheezing  Musculoskeletal:  [ ]  Arthritis, [ ]  Low back pain,  [ ]  Joint pain  Hematologic:No history of hypercoagulable state.  No history of easy bleeding.  No history of anemia  Gastrointestinal: No hematochezia or melena,  No gastroesophageal reflux, no trouble swallowing  Urinary: [X]  chronic Kidney disease, [ ]  on HD - [ ]  MWF or [ ]  TTHS, [ ]  Burning with urination, [ ]  Frequent urination, [ ]  Difficulty urinating;   Skin: No rashes  Psychological: No history of anxiety,  No history of depression   Physical Examination  Vitals:   02/22/19 1020  BP: 130/87  Pulse: 65  Resp: 20  Temp: (!) 97.3 F (36.3 C)  SpO2: 94%  Weight: 260 lb (117.9 kg)  Height: 5\' 11"  (1.803 m)    Body mass index is 36.26 kg/m.  General:  Alert and oriented, no acute  distress HEENT: Normal Neck: No  JVD Cardiac: Regular Rate and Rhythm Abdomen: Soft, non-tender, non-distended, no mass, palpable aorta but no pulsatility Skin: No rash Extremity Pulses:  2+ radial, brachial, femoral, dorsalis pedis, pulses bilaterally Musculoskeletal: No deformity or edema  Neurologic: Upper and lower extremity motor 5/5 and symmetric  DATA:  I reviewed the patient's CT angiogram from January 18, 2019.  This shows an 8.8 cm infrarenal abdominal aortic aneurysm.  There is an anterior left renal vein.  Successful coil embolization of the right internal iliac artery.  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  The stent graft extends into the right external iliac and down to the distal left common iliac artery  Reviewed the patient's angiogram which showed no evidence of type I 2 or 3 endoleak.  Successful coil embolization of gluteal artery aneurysm left side  Bilateral popliteal aneurysm with the origins of the tibial vessels being patent  Ultrasound dated January 23, 2019 shows 2.7 cm right popliteal aneurysm 3 cm left popliteal aneurysm   ASSESSMENT: #1 continued expansion of infrarenal abdominal aortic aneurysm despite multiple coil embolizations proximal cuff status post stent graft repair.  The aneurysm is now almost 9 cm in diameter.  I am concerned that this is at risk for rupture.  It has grown continuously since his original endograft repair in 2016.  I discussed with the patient today that I think that we should explant the stent graft and do traditional dacron abdominal aortic aneurysm repair.  I discussed with him this required suprarenal clamping with 5 to 10% chance of renal dysfunction either permanent or temporary.  Usually this is temporary.  I discussed with him also risk of infection and bleeding.  Risk of transfusion 20%.  Risk of death 5%.  I also discussed with him there would be several months of recovery time.  And the intensity of the operation  itself.  He currently is running for a local public office and wishes to defer an intervention on the aneurysm until after the election.  I also discussed with him the risk of rupture of a 9 cm abdominal aortic aneurysm is probably greater than 20 %/year.  #2 bilateral popliteal aneurysms these are large enough to consider repair but certainly they would have to be delayed until after his abdominal aortic aneurysm is repaired.   PLAN: I believe the patient requires explantation of his Gore Excluder stent graft repair.  He would require suprarenal clamping and aorto to bilateral external iliac artery bypass potential aortobifemoral bypass with the above-mentioned risks and benefits.  Patient wishes to discuss this further with his wife and he will return for an office visit on November 12 after the election is completed to discuss repair.  He understands that if he develops severe back or abdominal pain prior to this he should go to the emergency room.  At some point in the future we will need to consider his popliteal artery aneurysms but his infrarenal abdominal aortic aneurysm will need to be repaired first.  Did discuss with him the symptoms of distal embolization of cold extremity or painful extremity.  He would also report to the emergency room if he has any new symptoms.   [name], MD [facility] Office:  [phone] Pager: [phone]

## 2019-02-23 ENCOUNTER — Telehealth: Payer: Self-pay | Admitting: Interventional Cardiology

## 2019-02-23 DIAGNOSIS — I25810 Atherosclerosis of coronary artery bypass graft(s) without angina pectoris: Secondary | ICD-10-CM

## 2019-02-23 NOTE — Telephone Encounter (Signed)
Pt recently seen by Dr. Oneida Alar to discuss aneurysm surgery.  Pt states Dr. Oneida Alar spoke to him about a "very serious" surgery and he wanted to get Dr. Thompson Caul opinion on it.  Advised I will send to Dr. Tamala Julian for review.

## 2019-02-23 NOTE — Telephone Encounter (Signed)
New message   Patient would like Dr. Thompson Caul opinion on aneurysm surgery. Please call to discuss.

## 2019-02-26 ENCOUNTER — Other Ambulatory Visit: Payer: Self-pay | Admitting: Interventional Radiology

## 2019-02-26 DIAGNOSIS — I729 Aneurysm of unspecified site: Secondary | ICD-10-CM

## 2019-03-01 ENCOUNTER — Encounter: Payer: Self-pay | Admitting: *Deleted

## 2019-03-01 NOTE — Telephone Encounter (Signed)
Dr. Tamala Julian spoke with pt.  Ordered Lexiscan to be done prior to aneurysm surgery. Spoke with pt and went over instructions.  Advised I will post a copy to his MyChart and mail one out.

## 2019-03-14 ENCOUNTER — Telehealth (HOSPITAL_COMMUNITY): Payer: Self-pay | Admitting: *Deleted

## 2019-03-14 ENCOUNTER — Telehealth: Payer: Self-pay | Admitting: *Deleted

## 2019-03-14 NOTE — Telephone Encounter (Signed)
Patient given detailed instructions per Myocardial Perfusion Study Information Sheet for the test on 03/19/19. Patient notified to arrive 15 minutes early and that it is imperative to arrive on time for appointment to keep from having the test rescheduled.  If you need to cancel or reschedule your appointment, please call the office within 24 hours of your appointment. . Patient verbalized understanding. Daniel Blevins [name]    

## 2019-03-14 NOTE — Telephone Encounter (Signed)
Virtual Visit Pre-Appointment Phone Call  Today, I spoke with Daniel Blevins and performed the following actions:  1. I explained that we are currently trying to limit exposure to the COVID-19 virus by seeing patients at home rather than in the office.  I explained that the visits are best done by video, but can be done by telephone.  I asked the patient if a virtual visit that the patient would like to try instead of coming into the office. Daniel Blevins agreed to proceed with the virtual visit scheduled with [name] MD on 03/15/19.          2. I confirmed the BEST phone number to call the day of the visit and- I included this in appointment notes.  3. I asked if the patient had access to (through a family member/friend) a smartphone with video capability to be used for his visit?"  The patient said yes -    4. I confirmed consent by  a. sending through [place] or by email the [place] as written at the end of this message or  b. verbally as listed below. i. This visit is being performed in the setting of COVID-19. ii. All virtual visits are billed to your insurance company just like a normal visit would be.   iii. We'd like you to understand that the technology does not allow for your provider to perform an examination, and thus may limit your provider's ability to fully assess your condition.  iv. If your provider identifies any concerns that need to be evaluated in person, we will make arrangements to do so.   v. Finally, though the technology is pretty good, we cannot assure that it will always work on either your or our end, and in the setting of a video visit, we may have to convert it to a phone-only visit.  In either situation, we cannot ensure that we have a secure connection.   vi. Are you willing to proceed?"  STAFF: Did the patient verbally acknowledge consent to telehealth visit? Document YES/NO here: YES  2. I advised the patient to be  prepared - I asked that the patient, on the day of his visit, record any information possible with the equipment at his home, such as blood pressure, pulse, oxygen saturation, and your weight and write them all down. I asked the patient to have a pen and paper handy nearby the day of the visit as well.  3. If the patient was scheduled for a video visit, I informed the patient that the visit with the doctor would start with a text to the smartphone # given to [place] by the patient.         If the patient was scheduled for a telephone call, I informed the patient that the visit with the doctor would start with a call to the telephone # given to [place] by the patient.  4. I Informed patient they will receive a phone call 15 minutes prior to their appointment time from a [place] or nurse to review medications, allergies, etc. to prepare for the visit.    TELEPHONE CALL NOTE  Daniel Blevins has been deemed a candidate for a follow-up tele-health visit to limit community exposure during the Covid-19 pandemic. I spoke with the patient via phone to ensure availability of phone/video source, confirm preferred email & phone number, and discuss instructions and expectations.  I reminded Daniel Blevins to be prepared with any vital  sign and/or heart rhythm information that could potentially be obtained via home monitoring, at the time of his visit. I reminded Daniel Blevins to expect a phone call prior to his visit.  Daniel Blevins, NT 03/14/2019 11:07 AM     FULL LENGTH CONSENT FOR TELE-HEALTH VISIT   I hereby voluntarily request, consent and authorize [facility] and its employed or contracted physicians, physician assistants, nurse practitioners or other licensed health care professionals (the Practitioner), to provide me with telemedicine health care services (the "Services") as deemed necessary by the treating Practitioner. I acknowledge and consent to receive the Services by the Practitioner via telemedicine. I  understand that the telemedicine visit will involve communicating with the Practitioner through live audiovisual communication technology and the disclosure of certain medical information by electronic transmission. I acknowledge that I have been given the opportunity to request an in-person assessment or other available alternative prior to the telemedicine visit and am voluntarily participating in the telemedicine visit.  I understand that I have the right to withhold or withdraw my consent to the use of telemedicine in the course of my care at any time, without affecting my right to future care or treatment, and that the Practitioner or I may terminate the telemedicine visit at any time. I understand that I have the right to inspect all information obtained and/or recorded in the course of the telemedicine visit and may receive copies of available information for a reasonable fee.  I understand that some of the potential risks of receiving the Services via telemedicine include:  [place] Kitchen Delay or interruption in medical evaluation due to technological equipment failure or disruption; . Information transmitted may not be sufficient (e.g. poor resolution of images) to allow for appropriate medical decision making by the Practitioner; and/or  . In rare instances, security protocols could fail, causing a breach of personal health information.  Furthermore, I acknowledge that it is my responsibility to provide information about my medical history, conditions and care that is complete and accurate to the best of my ability. I acknowledge that Practitioner's advice, recommendations, and/or decision may be based on factors not within their control, such as incomplete or inaccurate data provided by me or distortions of diagnostic images or specimens that may result from electronic transmissions. I understand that the practice of medicine is not an exact science and that Practitioner makes no warranties or guarantees  regarding treatment outcomes. I acknowledge that I will receive a copy of this consent concurrently upon execution via email to the email address I last provided but may also request a printed copy by calling the office of [place].    I understand that my insurance will be billed for this visit.   I have read or had this consent read to me. . I understand the contents of this consent, which adequately explains the benefits and risks of the Services being provided via telemedicine.  . I have been provided ample opportunity to ask questions regarding this consent and the Services and have had my questions answered to my satisfaction. . I give my informed consent for the services to be provided through the use of telemedicine in my medical care  By participating in this telemedicine visit I agree to the above.

## 2019-03-15 ENCOUNTER — Other Ambulatory Visit: Payer: Self-pay

## 2019-03-15 ENCOUNTER — Ambulatory Visit (INDEPENDENT_AMBULATORY_CARE_PROVIDER_SITE_OTHER): Payer: 59 | Admitting: Vascular Surgery

## 2019-03-15 DIAGNOSIS — I714 Abdominal aortic aneurysm, without rupture, unspecified: Secondary | ICD-10-CM

## 2019-03-15 NOTE — Progress Notes (Signed)
Virtual Visit via Telephone Note  I connected with Daniel Blevins on 03/15/2019 using the telephone and verified that I was speaking with the correct person using two identifiers. Patient was located at home and accompanied by his wife. I am located at our office on [facility].   The limitations of evaluation and management by telemedicine and the availability of in person appointments have been previously discussed with the patient and are documented in the patients chart. The patient expressed understanding and consented to proceed.  PCP: Lajean Manes, MD  History of Present Illness:  Daniel Blevins is a 58 y.o. male, who underwent repair of a 6 cm infrarenal abdominal aortic aneurysm December 2015.    He currently has no abdominal or back pain.  He was last seen February 22, 2019 at which time we discussed explant of his stent graft for continued aneurysm expansion.  He has a lengthy history regarding the stent graft as described below.    He underwent preoperative right internal iliac artery coil embolization.  The aneurysm continued to expand and a proximal cuff was placed in January 2017.  This was done for a proximal type I endoleak documented on angiogram.  He underwent embolization of a type II endoleak in 2017.  This involved coil embolization of paired L4 lumbar arteries and a right L3 lumbar artery as well as the origin of the inferior mesenteric artery.  The aneurysm continue to expand and in January 2019 he underwent direct sac puncture and Onyx embolization of an L2 lumbar artery.  The aneurysm continued to expand.  In summary his abdominal aortic aneurysm was 6 cm in diameter November 2015, 6.2 cm in diameter January 2016, 6.5 cm December 2016, 6.4 cm February 2017, 6.5 cm diameter July 2017, 6.8 cm diameter December 2017, 7.7 cm December 2018, 8 cm March 2019, 8.4 cm August 2019, 8.8 cm September 2020.  The patient currently has no abdominal or back pain.  Other medical problems  include prior history of coronary artery aneurysm, bilateral popliteal aneurysms, recently treated gluteal artery aneurysm.  He also has CKD stage II, hyperlipidemia hypertension all of which are currently stable.  He does have significant family history of multiple aneurysms in his father.  His past surgical history is remarkable for prior gastric bypass.  He is currently on aspirin and a statin.    Past Medical History:  Diagnosis Date   AAA (abdominal aortic aneurysm) ([place])    a. s/p EVAR 2015 with endoleak treated by IR.   AKI (acute kidney injury) ([place])    Chest pain    CKD (chronic kidney disease), stage II    Coronary artery disease    a. CABG 2001 at [facility]. b. Cath 06/2017 to evaluate coronary aneurysm, workup in progress as of 06/2017.   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    Past Surgical History:  Procedure Laterality Date   ABDOMINAL AORTIC ENDOVASCULAR STENT GRAFT N/A 04/30/2014   Procedure: ABDOMINAL  AORTIC ENDOVASCULAR STENT GRAFT;  Surgeon: Elam Dutch, MD;  Location: [facility] OR;  Service: Vascular;  Laterality: N/A;   ABDOMINAL AORTIC ENDOVASCULAR STENT GRAFT N/A 05/26/2015   Procedure: ABDOMINAL AORTIC ENDOVASCULAR STENT GRAFT RE-INTERVENTION; REPAIR OF PROXIMAL CUFF;  Surgeon: Elam Dutch, MD;  Location: [facility] OR;  Service: Vascular;  Laterality: N/A;   BACK SURGERY  2009   lower back   CARDIAC CATHETERIZATION     COLONOSCOPY     CORONARY ARTERY BYPASS GRAFT  2001   CORONARY STENT INTERVENTION N/A 07/15/2017   Procedure: CORONARY STENT INTERVENTION;   Surgeon: Belva Crome, MD;  Location: [place] [facility];  Service: Cardiovascular;  Laterality: N/A;   CYSTOSCOPY WITH RETROGRADE PYELOGRAM, URETEROSCOPY AND STENT PLACEMENT Bilateral 02/07/2014   Procedure: CYSTOSCOPY WITH BILATERAL RETROGRADE PYELOGRAM, BILATERAL URETEROSCOPY, RIGHT EXTRACTION OF STONES AND RIGHT STENT PLACEMENT;  Surgeon: Jorja Loa, MD;  Location: [facility];  Service: Urology;  Laterality: Bilateral;   ESOPHAGOGASTRODUODENOSCOPY     IR ANGIOGRAM EXTREMITY BILATERAL  02/15/2019   IR ANGIOGRAM PELVIS SELECTIVE OR SUPRASELECTIVE  02/15/2019   IR ANGIOGRAM SELECTIVE EACH ADDITIONAL VESSEL  02/15/2019   IR ANGIOGRAM SELECTIVE EACH ADDITIONAL VESSEL  02/15/2019   IR AORTAGRAM ABDOMINAL SERIALOGRAM  05/20/2017   IR CT SPINE LTD  05/20/2017   IR EMBO ARTERIAL NOT HEMORR HEMANG INC GUIDE ROADMAPPING  05/20/2017   IR EMBO ARTERIAL NOT HEMORR HEMANG INC GUIDE ROADMAPPING  02/15/2019   IR GENERIC HISTORICAL  11/06/2015   IR RADIOLOGIST EVAL & MGMT 11/06/2015 Jacqulynn Cadet, MD [facility] INTERV RAD   IR GENERIC HISTORICAL  04/20/2016   IR RADIOLOGIST EVAL & MGMT 04/20/2016 Jacqulynn Cadet, MD [facility] INTERV RAD   IR RADIOLOGIST EVAL & MGMT  05/11/2017   IR RADIOLOGIST EVAL & MGMT  06/22/2017   IR RADIOLOGIST EVAL & MGMT  12/08/2017   IR RADIOLOGIST EVAL & MGMT  01/23/2019   IR US GUIDE VASC ACCESS LEFT  02/15/2019   LAPAROSCOPIC GASTRIC BYPASS  2010   [facility]   LAPAROSCOPIC LYSIS OF ADHESIONS N/A 03/06/2013   Procedure: LAPAROSCOPIC LYSIS OF ADHESIONS AND CLOSURE OF INTERNAL HERNIAS x2;  Surgeon: Adin Hector, MD;  [facility];  Service: General;  Laterality: N/A;   LAPAROSCOPY N/A 03/06/2013   Procedure: LAPAROSCOPY DIAGNOSTIC;  Surgeon: Adin Hector, MD;  Location: [facility];  Service: General;  Laterality: N/A;   LEFT HEART CATH AND CORS/GRAFTS ANGIOGRAPHY N/A 06/09/2017   Procedure: LEFT HEART CATH AND CORS/GRAFTS ANGIOGRAPHY;  Surgeon: Belva Crome,  MD;  Location: [place] [facility];  Service: Cardiovascular;  Laterality: N/A;   PERIPHERAL VASCULAR CATHETERIZATION Right 04/30/2014   Procedure: EMBOLIZATION right internal iliac;  Surgeon: Elam Dutch, MD;  Location: [place];  Service: Vascular;  Laterality: Right;   RADIOLOGY WITH ANESTHESIA N/A 05/20/2017   Procedure: Endoleak Repair;  Surgeon: Jacqulynn Cadet, MD;  Location: [place];  Service: Radiology;  Laterality: N/A;    Current Meds  Medication Sig   amLODipine (NORVASC) 10 MG tablet Take 10 mg by mouth at bedtime.    aspirin 81 MG tablet Take 81 mg by mouth every morning.    buPROPion (WELLBUTRIN XL) 150 MG 24 hr tablet Take 150 mg by mouth daily.    carvedilol (COREG) 25 MG tablet Take 1 tablet (25 mg total) by mouth 2 (two) times daily with a meal.   celecoxib (CELEBREX) 200 MG capsule Take 200 mg by mouth daily.   Cholecalciferol (VITAMIN  D) 50 MCG (2000 UT) CAPS Take 2,000 Units by mouth daily.   Omega-3 Fatty Acids (FISH OIL TRIPLE STRENGTH) 1400 MG CAPS Take 1,400 mg by mouth daily.   OVER THE COUNTER MEDICATION Take 2 capsules by mouth daily.   rosuvastatin (CRESTOR) 40 MG tablet Take 1 tablet (40 mg total) by mouth daily.   traZODone (DESYREL) 100 MG tablet Take 100 mg by mouth at bedtime.   Turmeric 500 MG CAPS Take 500 mg by mouth daily.   vitamin B-12 (CYANOCOBALAMIN) 1000 MCG tablet Take 1,000 mcg by mouth daily.    Review of systems: He has no chest pain.  He has no shortness of breath.    Assessment and Plan: Continued expansion of abdominal aortic aneurysm despite multiple reinterventions and previous stent grafting.  Aneurysm diameter is now 8.8 cm and at risk of rupture.  I have discussed with Mr. Surface and his wife today explanting the stent graft as well as repair of the abdominal aortic aneurysm with either aorto iliac or aortobifemoral bypass grafting at the same time.  I discussed with him today the risk benefits possible complications  and procedure details including but not limited to the following.  Risk of rupture of the aneurysm 20 %/year without repair.  Risks of operation including but not limited to bleeding risk requiring transfusion 20%, myocardial events 5%, death 1 to 2%, erectile dysfunction greater than 50%, renal dysfunction 5 to 10%, infection 1%.  We also discussed the details of the operation as well as his hospital stay.  He understands and agrees to proceed.  We have scheduled him for his operation on 2019/04/19.  [name], MD [facility] [place] Office: [phone]    I discussed the assessment and treatment plan with the patient. The patient was provided an opportunity to ask questions and all were answered. The patient agreed with the plan and demonstrated an understanding of the instructions.   The patient was advised to call back or seek an in-person evaluation if the symptoms worsen or if the condition fails to improve as anticipated.  I spent 10 minutes with the patient via telephone encounter.   Signed, [name] [facility] [place] Office: [phone]  03/15/2019, 12:06 PM

## 2019-03-15 NOTE — H&P (View-Only) (Signed)
Virtual Visit via Telephone Note  I connected with Daniel Blevins on 03/15/2019 using the telephone and verified that I was speaking with the correct person using two identifiers. Patient was located at home and accompanied by his wife. I am located at our office on [facility].   The limitations of evaluation and management by telemedicine and the availability of in person appointments have been previously discussed with the patient and are documented in the patients chart. The patient expressed understanding and consented to proceed.  PCP: Lajean Manes, MD  History of Present Illness:  Daniel Blevins is a 58 y.o. male, who underwent repair of a 6 cm infrarenal abdominal aortic aneurysm December 2015.    He currently has no abdominal or back pain.  He was last seen February 22, 2019 at which time we discussed explant of his stent graft for continued aneurysm expansion.  He has a lengthy history regarding the stent graft as described below.    He underwent preoperative right internal iliac artery coil embolization.  The aneurysm continued to expand and a proximal cuff was placed in January 2017.  This was done for a proximal type I endoleak documented on angiogram.  He underwent embolization of a type II endoleak in 2017.  This involved coil embolization of paired L4 lumbar arteries and a right L3 lumbar artery as well as the origin of the inferior mesenteric artery.  The aneurysm continue to expand and in January 2019 he underwent direct sac puncture and Onyx embolization of an L2 lumbar artery.  The aneurysm continued to expand.  In summary his abdominal aortic aneurysm was 6 cm in diameter November 2015, 6.2 cm in diameter January 2016, 6.5 cm December 2016, 6.4 cm February 2017, 6.5 cm diameter July 2017, 6.8 cm diameter December 2017, 7.7 cm December 2018, 8 cm March 2019, 8.4 cm August 2019, 8.8 cm September 2020.  The patient currently has no abdominal or back pain.  Other medical problems  include prior history of coronary artery aneurysm, bilateral popliteal aneurysms, recently treated gluteal artery aneurysm.  He also has CKD stage II, hyperlipidemia hypertension all of which are currently stable.  He does have significant family history of multiple aneurysms in his father.  His past surgical history is remarkable for prior gastric bypass.  He is currently on aspirin and a statin.    Past Medical History:  Diagnosis Date   AAA (abdominal aortic aneurysm) ([place])    a. s/p EVAR 2015 with endoleak treated by IR.   AKI (acute kidney injury) ([place])    Chest pain    CKD (chronic kidney disease), stage II    Coronary artery disease    a. CABG 2001 at [facility]. b. Cath 06/2017 to evaluate coronary aneurysm, workup in progress as of 06/2017.   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    Past Surgical History:  Procedure Laterality Date   ABDOMINAL AORTIC ENDOVASCULAR STENT GRAFT N/A 04/30/2014   Procedure: ABDOMINAL  AORTIC ENDOVASCULAR STENT GRAFT;  Surgeon: Elam Dutch, MD;  Location: [facility] OR;  Service: Vascular;  Laterality: N/A;   ABDOMINAL AORTIC ENDOVASCULAR STENT GRAFT N/A 05/26/2015   Procedure: ABDOMINAL AORTIC ENDOVASCULAR STENT GRAFT RE-INTERVENTION; REPAIR OF PROXIMAL CUFF;  Surgeon: Elam Dutch, MD;  Location: [facility] OR;  Service: Vascular;  Laterality: N/A;   BACK SURGERY  2009   lower back   CARDIAC CATHETERIZATION     COLONOSCOPY     CORONARY ARTERY BYPASS GRAFT  2001   CORONARY STENT INTERVENTION N/A 07/15/2017   Procedure: CORONARY STENT INTERVENTION;   Surgeon: Belva Crome, MD;  Location: [place] [facility];  Service: Cardiovascular;  Laterality: N/A;   CYSTOSCOPY WITH RETROGRADE PYELOGRAM, URETEROSCOPY AND STENT PLACEMENT Bilateral 02/07/2014   Procedure: CYSTOSCOPY WITH BILATERAL RETROGRADE PYELOGRAM, BILATERAL URETEROSCOPY, RIGHT EXTRACTION OF STONES AND RIGHT STENT PLACEMENT;  Surgeon: Jorja Loa, MD;  [facility];  Service: Urology;  Laterality: Bilateral;   ESOPHAGOGASTRODUODENOSCOPY     IR ANGIOGRAM EXTREMITY BILATERAL  02/15/2019   IR ANGIOGRAM PELVIS SELECTIVE OR SUPRASELECTIVE  02/15/2019   IR ANGIOGRAM SELECTIVE EACH ADDITIONAL VESSEL  02/15/2019   IR ANGIOGRAM SELECTIVE EACH ADDITIONAL VESSEL  02/15/2019   IR AORTAGRAM ABDOMINAL SERIALOGRAM  05/20/2017   IR CT SPINE LTD  05/20/2017   IR EMBO ARTERIAL NOT HEMORR HEMANG INC GUIDE ROADMAPPING  05/20/2017   IR EMBO ARTERIAL NOT HEMORR HEMANG INC GUIDE ROADMAPPING  02/15/2019   IR GENERIC HISTORICAL  11/06/2015   IR RADIOLOGIST EVAL & MGMT 11/06/2015 Jacqulynn Cadet, MD [facility] INTERV RAD   IR GENERIC HISTORICAL  04/20/2016   IR RADIOLOGIST EVAL & MGMT 04/20/2016 Jacqulynn Cadet, MD [facility] INTER[facility] RAD   IR RADIOLOGIST EVAL & MGMT  05/11/2017   IR RADIOLOGIST EVAL & MGMT  06/22/2017   IR RADIOLOGIST EVAL & MGMT  12/08/2017   IR RADIOLOGIST EVAL & MGMT  01/23/2019   IR US GUIDE VASC ACCESS LEFT  02/15/2019   LAPAROSCOPIC GASTRIC BYPASS  2010   [facility]   LAPAROSCOPIC LYSIS OF ADHESIONS N/A 03/06/2013   Procedure: LAPAROSCOPIC LYSIS OF ADHESIONS AND CLOSURE OF INTERNAL HERNIAS x2;  Surgeon: Adin Hector, MD;  [facility];  Service: General;  Laterality: N/A;   LAPAROSCOPY N/A 03/06/2013   Procedure: LAPAROSCOPY DIAGNOSTIC;  Surgeon: Adin Hector, MD;  Location: [facility];  Service: General;  Laterality: N/A;   LEFT HEART CATH AND CORS/GRAFTS ANGIOGRAPHY N/A 06/09/2017   Procedure: LEFT HEART CATH AND CORS/GRAFTS ANGIOGRAPHY;  Surgeon: Belva Crome,  MD;  Location: [place] [facility];  Service: Cardiovascular;  Laterality: N/A;   PERIPHERAL VASCULAR CATHETERIZATION Right 04/30/2014   Procedure: EMBOLIZATION right internal iliac;  Surgeon: Elam Dutch, MD;  Location: [place];  Service: Vascular;  Laterality: Right;   RADIOLOGY WITH ANESTHESIA N/A 05/20/2017   Procedure: Endoleak Repair;  Surgeon: Jacqulynn Cadet, MD;  Location: [place];  Service: Radiology;  Laterality: N/A;    Current Meds  Medication Sig   amLODipine (NORVASC) 10 MG tablet Take 10 mg by mouth at bedtime.    aspirin 81 MG tablet Take 81 mg by mouth every morning.    buPROPion (WELLBUTRIN XL) 150 MG 24 hr tablet Take 150 mg by mouth daily.    carvedilol (COREG) 25 MG tablet Take 1 tablet (25 mg total) by mouth 2 (two) times daily with a meal.   celecoxib (CELEBREX) 200 MG capsule Take 200 mg by mouth daily.   Cholecalciferol (VITAMIN  D) 50 MCG (2000 UT) CAPS Take 2,000 Units by mouth daily.   Omega-3 Fatty Acids (FISH OIL TRIPLE STRENGTH) 1400 MG CAPS Take 1,400 mg by mouth daily.   OVER THE COUNTER MEDICATION Take 2 capsules by mouth daily.   rosuvastatin (CRESTOR) 40 MG tablet Take 1 tablet (40 mg total) by mouth daily.   traZODone (DESYREL) 100 MG tablet Take 100 mg by mouth at bedtime.   Turmeric 500 MG CAPS Take 500 mg by mouth daily.   vitamin B-12 (CYANOCOBALAMIN) 1000 MCG tablet Take 1,000 mcg by mouth daily.    Review of systems: He has no chest pain.  He has no shortness of breath.    Assessment and Plan: Continued expansion of abdominal aortic aneurysm despite multiple reinterventions and previous stent grafting.  Aneurysm diameter is now 8.8 cm and at risk of rupture.  I have discussed with Mr. Littrel and his wife today explanting the stent graft as well as repair of the abdominal aortic aneurysm with either aorto iliac or aortobifemoral bypass grafting at the same time.  I discussed with him today the risk benefits possible complications  and procedure details including but not limited to the following.  Risk of rupture of the aneurysm 20 %/year without repair.  Risks of operation including but not limited to bleeding risk requiring transfusion 20%, myocardial events 5%, death 1 to 2%, erectile dysfunction greater than 50%, renal dysfunction 5 to 10%, infection 1%.  We also discussed the details of the operation as well as his hospital stay.  He understands and agrees to proceed.  We have scheduled him for his operation on 2019/04/17.  [name], MD [facility] [place] Office: [phone]    I discussed the assessment and treatment plan with the patient. The patient was provided an opportunity to ask questions and all were answered. The patient agreed with the plan and demonstrated an understanding of the instructions.   The patient was advised to call back or seek an in-person evaluation if the symptoms worsen or if the condition fails to improve as anticipated.  I spent 10 minutes with the patient via telephone encounter.   Signed, [name] [facility] [place] Office: [phone]  03/15/2019, 12:06 PM

## 2019-03-16 ENCOUNTER — Other Ambulatory Visit: Payer: Self-pay

## 2019-03-19 ENCOUNTER — Other Ambulatory Visit: Payer: Self-pay

## 2019-03-19 ENCOUNTER — Encounter (HOSPITAL_COMMUNITY): Payer: 59

## 2019-03-19 ENCOUNTER — Ambulatory Visit (HOSPITAL_COMMUNITY): Payer: No Typology Code available for payment source | Attending: Cardiovascular Disease

## 2019-03-19 DIAGNOSIS — I25810 Atherosclerosis of coronary artery bypass graft(s) without angina pectoris: Secondary | ICD-10-CM

## 2019-03-19 LAB — MYOCARDIAL PERFUSION IMAGING
LV dias vol: 168 mL (ref 62–150)
LV sys vol: 105 mL
Peak HR: 67 {beats}/min
Rest HR: 50 {beats}/min
SDS: 2
SRS: 0
SSS: 2
TID: 1.01

## 2019-03-19 MED ORDER — TECHNETIUM TC 99M TETROFOSMIN IV KIT
10.4000 | PACK | Freq: Once | INTRAVENOUS | Status: AC | PRN
  Administered 2019-03-19: 10.4 via INTRAVENOUS
  Filled 2019-03-19: qty 11

## 2019-03-19 MED ORDER — REGADENOSON 0.4 MG/5ML IV SOLN
0.4000 mg | Freq: Once | INTRAVENOUS | Status: AC
  Administered 2019-03-19: 0.4 mg via INTRAVENOUS

## 2019-03-19 MED ORDER — TECHNETIUM TC 99M TETROFOSMIN IV KIT
32.2000 | PACK | Freq: Once | INTRAVENOUS | Status: AC | PRN
  Administered 2019-03-19: 32.2 via INTRAVENOUS
  Filled 2019-03-19: qty 33

## 2019-03-21 ENCOUNTER — Other Ambulatory Visit: Payer: Self-pay

## 2019-03-21 ENCOUNTER — Ambulatory Visit
Admission: RE | Admit: 2019-03-21 | Discharge: 2019-03-21 | Disposition: A | Discharge status: Home / Self Care | Payer: 59 | Source: Ambulatory Visit | Attending: Interventional Radiology | Admitting: Interventional Radiology

## 2019-03-21 DIAGNOSIS — I729 Aneurysm of unspecified site: Secondary | ICD-10-CM

## 2019-03-21 HISTORY — PX: IR RADIOLOGIST EVAL & MGMT: IMG5224

## 2019-03-21 NOTE — Progress Notes (Signed)
Chief Complaint: Patient was consulted remotely today (TeleHealth) for multifocal arterial aneurysmal disease at the request of [place].    Referring Physician(s): [name], MD  History of Present Illness: Daniel Blevins is a 58 y.o. male who underwentAAA repair with Dr. Oneida Alar in 2015. He subsequently developed an endoleak and was referred to interventional radiology for management. He underwent sucessful embolization back in 2017, however developed abdominal pain with associated CT findings demonstrating further progression of his endoleak.Hesubsequentlyunderwent direct stick endoleak repair on 05/20/17. Patient tolerated the procedure well and was able to discharge home the same day.  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  Most recently, he underwent coil and Gelfoam embolization of the left superior gluteal artery aneurysm on 02/15/2019.  Our appointment today is for 1 month follow-up evaluation.  Daniel Blevins is doing quite well.  He notes that he does have some claudication in the left gluteal region when walking up an incline or upstairs.  This is improved when he stops to rest.  He has no pain in the gluteal region at baseline.  No palpable abnormality that he has noted.  He reports that his femoral puncture site is well-healed.  Past Medical History:  Diagnosis Date  . AAA (abdominal aortic aneurysm) ([place])    a. s/p EVAR 2015 with endoleak treated by IR.  Marland Kitchen AKI (acute kidney injury) ([place])   . Chest pain   . CKD (chronic kidney disease), stage II   . Coronary artery disease    a. CABG  2001 at [facility]. b. Cath 06/2017 to evaluate coronary aneurysm, workup in progress as of 06/2017.  Marland Kitchen Deafness in right ear   . Depression   . Dysrhythmia   . 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 History of kidney stones   . Hx of CABG   . Hyperlipidemia   . Hypertension   . Sleep apnea    no longer is an issue  . Urinary frequency   . Vascular anomaly    a. 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    Past Surgical History:  Procedure Laterality Date  . ABDOMINAL AORTIC ENDOVASCULAR STENT GRAFT N/A 04/30/2014   Procedure: ABDOMINAL AORTIC ENDOVASCULAR STENT GRAFT;  Surgeon: Elam Dutch, MD;  Location: [facility] OR;  Service: Vascular;  Laterality: N/A;  . ABDOMINAL AORTIC ENDOVASCULAR STENT GRAFT N/A 05/26/2015   Procedure: ABDOMINAL AORTIC ENDOVASCULAR STENT GRAFT RE-INTERVENTION; REPAIR OF PROXIMAL CUFF;  Surgeon: Elam Dutch, MD;  Location: [place];  Service: Vascular;  Laterality: N/A;  . BACK SURGERY  2009   lower back  . CARDIAC CATHETERIZATION    . COLONOSCOPY    . CORONARY ARTERY BYPASS GRAFT  2001  . CORONARY STENT INTERVENTION N/A 07/15/2017   Procedure: CORONARY STENT INTERVENTION;  Surgeon: Belva Crome, MD;  Location: [place] CV LAB;  Service: Cardiovascular;  Laterality: N/A;  . CYSTOSCOPY WITH RETROGRADE PYELOGRAM, URETEROSCOPY AND STENT PLACEMENT Bilateral 02/07/2014   Procedure: CYSTOSCOPY WITH BILATERAL RETROGRADE PYELOGRAM, BILATERAL URETEROSCOPY, RIGHT EXTRACTION OF STONES AND RIGHT STENT PLACEMENT;  Surgeon: Lillette Boxer Dahlstedt,  MD;  Location: [facility];  Service: Urology;  Laterality: Bilateral;  . ESOPHAGOGASTRODUODENOSCOPY    . IR ANGIOGRAM EXTREMITY BILATERAL  02/15/2019  . IR ANGIOGRAM PELVIS SELECTIVE OR SUPRASELECTIVE  02/15/2019  . IR ANGIOGRAM SELECTIVE EACH ADDITIONAL VESSEL  02/15/2019  . IR ANGIOGRAM SELECTIVE EACH  ADDITIONAL VESSEL  02/15/2019  . IR AORTAGRAM ABDOMINAL SERIALOGRAM  05/20/2017  . IR CT SPINE LTD  05/20/2017  . IR EMBO ARTERIAL NOT HEMORR HEMANG INC GUIDE ROADMAPPING  05/20/2017  . IR EMBO ARTERIAL NOT HEMORR HEMANG INC GUIDE ROADMAPPING  02/15/2019  . IR GENERIC HISTORICAL  11/06/2015   IR RADIOLOGIST EVAL & MGMT 11/06/2015 Jacqulynn Cadet, MD [facility] INTERV RAD  . IR GENERIC HISTORICAL  04/20/2016   IR RADIOLOGIST EVAL & MGMT 04/20/2016 Jacqulynn Cadet, MD [facility] INTERV RAD  . IR RADIOLOGIST EVAL & MGMT  05/11/2017  . IR RADIOLOGIST EVAL & MGMT  06/22/2017  . IR RADIOLOGIST EVAL & MGMT  12/08/2017  . IR RADIOLOGIST EVAL & MGMT  01/23/2019  . IR US GUIDE VASC ACCESS LEFT  02/15/2019  . LAPAROSCOPIC GASTRIC BYPASS  2010   [facility]  . LAPAROSCOPIC LYSIS OF ADHESIONS N/A 03/06/2013   Procedure: LAPAROSCOPIC LYSIS OF ADHESIONS AND CLOSURE OF INTERNAL HERNIAS x2;  Surgeon: Adin Hector, MD;  Location: [facility];  Service: General;  Laterality: N/A;  . LAPAROSCOPY N/A 03/06/2013   Procedure: LAPAROSCOPY DIAGNOSTIC;  Surgeon: Adin Hector, MD;  Location: [facility];  Service: General;  Laterality: N/A;  . LEFT HEART CATH AND CORS/GRAFTS ANGIOGRAPHY N/A 06/09/2017   Procedure: LEFT HEART CATH AND CORS/GRAFTS ANGIOGRAPHY;  Surgeon: Belva Crome, MD;  Location: [place] CV LAB;  Service: Cardiovascular;  Laterality: N/A;  . PERIPHERAL VASCULAR CATHETERIZATION Right 04/30/2014   Procedure: EMBOLIZATION right internal iliac;  Surgeon: Elam Dutch, MD;  Location: [place];  Service: Vascular;  Laterality: Right;  . RADIOLOGY WITH ANESTHESIA N/A 05/20/2017   Procedure: Endoleak Repair;  Surgeon: Jacqulynn Cadet, MD;  Location: [place];  Service: Radiology;  Laterality: N/A;    Allergies: Patient has no known allergies.  Medications: Prior to Admission medications   Medication Sig Start Date End Date Taking? Authorizing Provider  amLODipine (NORVASC) 10 MG tablet Take 10 mg by mouth at bedtime.      [provider]  aspirin 81 MG tablet Take 81 mg by mouth every morning.     [provider]  buPROPion (WELLBUTRIN XL) 150 MG 24 hr tablet Take 150 mg by mouth daily.  12/12/15   [provider]  carvedilol (COREG) 25 MG tablet Take 1 tablet (25 mg total) by mouth 2 (two) times daily with a meal. 11/27/16   Hongalgi, Lenis Dickinson, MD  celecoxib (CELEBREX) 200 MG capsule Take 200 mg by mouth daily.    [provider]  Cholecalciferol (VITAMIN D) 50 MCG (2000 UT) CAPS Take 2,000 Units by mouth daily.    [provider]  Moringa Oleifera (MORINGA PO) Take 750 mg by mouth daily.    [provider]  Omega-3 Fatty Acids (FISH OIL TRIPLE STRENGTH) 1400 MG CAPS Take 1,400 mg by mouth daily.    [provider]  rosuvastatin (CRESTOR) 40 MG tablet Take 1 tablet (40 mg total) by mouth daily. 06/21/18   Belva Crome, MD  traZODone (DESYREL) 100 MG tablet Take 100 mg by mouth at bedtime.    [provider]  Turmeric 500 MG CAPS Take 500 mg by mouth daily.  [provider]  vitamin B-12 (CYANOCOBALAMIN) 1000 MCG tablet Take 1,000 mcg by mouth daily.    [provider]  vitamin C (ASCORBIC ACID) 500 MG tablet Take 500 mg by mouth daily.    [provider]     Family History  Problem Relation Age of Onset  . Lung cancer Mother   . Cancer Mother        Lung  . AAA (abdominal aortic aneurysm) Father   . Heart disease Father        before age 35  . Heart disease Brother        before age 62    Social History   Socioeconomic History  . Marital status: Married    Spouse name: Not on file  . Number of children: Not on file  . Years of education: Not on file  . Highest education level: Not on file  Occupational History  . Not on file  Social Needs  . Financial resource strain: Not on file  . Food insecurity    Worry: Not on file    Inability: Not on file  . Transportation needs    Medical: Not on file     Non-medical: Not on file  Tobacco Use  . Smoking status: Never Smoker  . Smokeless tobacco: Never Used  Substance and Sexual Activity  . Alcohol use: Yes    Comment: occasional  . Drug use: No  . Sexual activity: Never  Lifestyle  . Physical activity    Days per week: Not on file    Minutes per session: Not on file  . Stress: Not on file  Relationships  . Social Herbalist on phone: Not on file    Gets together: Not on file    Attends religious service: Not on file    Active member of club or organization: Not on file    Attends meetings of clubs or organizations: Not on file    Relationship status: Not on file  Other Topics Concern  . Not on file  Social History Narrative  . Not on file    Review of Systems  Review of Systems: A 12 point ROS discussed and pertinent positives are indicated in the HPI above.  All other systems are negative.  Physical Exam No direct physical exam was performed (except for noted visual exam findings with Video Visits).    Vital Signs: There were no vitals taken for this visit.  Imaging: No results found.  Labs:  CBC: Recent Labs    02/15/19 0640  WBC 4.6  HGB 11.0*  HCT 35.8*  PLT 162    COAGS: Recent Labs    02/15/19 0640  INR 1.2    BMP: Recent Labs    01/18/19 0733 02/15/19 0640  NA  --  141  K  --  3.5  CL  --  106  CO2  --  25  GLUCOSE  --  112*  BUN  --  19  CALCIUM  --  8.1*  CREATININE 1.20 1.29*  GFRNONAA  --  >60  GFRAA  --  >60    LIVER FUNCTION TESTS: Recent Labs    10/05/18 0722 12/05/18 0734  BILITOT 0.3 0.2  AST 20 20  ALT 24 18  ALKPHOS 66 59  PROT 6.8 6.7  ALBUMIN 4.0 3.9    TUMOR MARKERS: No results for input(s): AFPTM, CEA, CA199, CHROMGRNA in the last 8760 hours.  Assessment and Plan:  Doing well 1 month status post endovascular repair of a large left superior gluteal artery aneurysm.  He has some mild symptoms of gluteal claudication which is not unexpected.   Otherwise, no evidence of complication or adverse event.  His puncture site is well-healed.  He is scheduled to undergo open explantation and an aortobifemoral bypass by Dr. Oneida Alar for treatment of his persistently enlarging abdominal aortic aneurysm.  We will give him some time to recover from that procedure before our next follow-up visit.  Ideally, I would like a follow-up CT arteriogram in April or May 2021 as well as bilateral lower extremity duplex arterial study to evaluate his popliteal artery aneurysms.  Clinic visit to follow.    Electronically Signed: Jacqulynn Cadet 03/21/2019, 12:04 PM   I spent a total of 10 Minutes in remote clinical consultation, greater than 50% of which was counseling/coordinating care for superior gluteal artery aneurysm, left.    Visit type: Audio only (telephone). Audio (no video) only due to pateint preference. Alternative for in-person consultation at [facility], [address], [place], [place]. This visit type was conducted due to national recommendations for restrictions regarding the COVID-19 Pandemic (e.g. social distancing).  This format is felt to be most appropriate for this patient at this time.  All issues noted in this document were discussed and addressed.

## 2019-03-23 ENCOUNTER — Other Ambulatory Visit: Payer: Self-pay | Admitting: *Deleted

## 2019-03-23 ENCOUNTER — Telehealth: Payer: Self-pay | Admitting: *Deleted

## 2019-03-23 DIAGNOSIS — N179 Acute kidney failure, unspecified: Secondary | ICD-10-CM

## 2019-03-23 NOTE — Telephone Encounter (Signed)
NOTE FOR CARDIAC CLEARANCE FROM DR. SMITH.

## 2019-03-23 NOTE — Telephone Encounter (Signed)
-----   Message from Elam Dutch, MD sent at 03/23/2019  1:32 PM EST -----  ----- Message ----- From: Belva Crome, MD Sent: 03/23/2019  11:42 AM EST To: Lajean Manes, MD, Elam Dutch, MD, #  Let the patient know the nuclear study shows no evidence of decreased blood flow to the heart.  Ejection fraction is 35 to 40% which raises the risk assessment intermediate.  I believe the EF is erroneous.  He has had no events since his last LV assessment by cath and echo which were within the past 18 months.  Wall motion studies binucleate can be falsely depressed.  He is cleared to proceed with vascular surgery as outlined by Dr. Dayton Scrape. A copy will be sent to Lajean Manes, MD

## 2019-03-26 NOTE — Pre-Procedure Instructions (Signed)
[place], [address] [place] [address] Phone: [phone] Fax: [phone]  [facility], [address] [place] [address] Phone: [phone] Fax: [phone]  [id number] [place], [address] [place] [address] Phone: [phone] Fax: [phone]    Your procedure is scheduled on Mon., 04/21/2019 from 7:30AM-12:40PM  Report to [facility] Entrance "A" at 5:30AM  Call this number if you have problems the morning of surgery:  [phone]   Remember:  Do not eat or drink after midnight on Nov. 29th    Take these medicines the morning of surgery with A SIP OF WATER: BuPROPion (WELLBUTRIN XL) Carvedilol (COREG)  Rosuvastatin (CRESTOR)   Follow your surgeon's instructions on when to stop Aspirin.  If no instructions were given by your surgeon then you will need to call the office to get those instructions.     As of today, stop taking all Aspirin (unless instructed by your doctor) and Other Aspirin containing products, Vitamins, Fish oils, and Herbal medications. Also stop all NSAIDS i.e. Advil, Ibuprofen, Motrin, Aleve, Anaprox, Naproxen, BC, Goody Powders, and all Supplements. Including: Celecoxib (CELEBREX)  No Smoking of any kind, Tobacco, or Alcohol products 24 hours prior to your procedure. If you use a Cpap at night, you may bring all equipment for your overnight stay.   Special instructions:  [facility]- Preparing For Surgery  Before surgery, you can play an important role. Because skin is not sterile, your skin needs to be as free of germs as possible. You can reduce the number of germs on your skin by washing with CHG (chlorahexidine gluconate) Soap before surgery.  CHG is an antiseptic cleaner which kills germs and bonds with  the skin to continue killing germs even after washing.    Please do not use if you have an allergy to CHG or antibacterial soaps. If your skin becomes reddened/irritated stop using the CHG.  Do not shave (including legs and underarms) for at least 48 hours prior to first CHG shower. It is OK to shave your face.  Please follow these instructions carefully.   1. Shower the NIGHT BEFORE SURGERY and the MORNING OF SURGERY with CHG.   2. If you chose to wash your hair, wash your hair first as usual with your normal shampoo.  3. After you shampoo, rinse your hair and body thoroughly to remove the shampoo.  4. Use CHG as you would any other liquid soap. You can apply CHG directly to the skin and wash gently with a scrungie or a clean washcloth.   5. Apply the CHG Soap to your body ONLY FROM THE NECK DOWN.  Do not use on open wounds or open sores. Avoid contact with your eyes, ears, mouth and genitals (private parts). Wash Face and genitals (private parts)  with your normal soap.  6. Wash thoroughly, paying special attention to the area where your surgery will be performed.  7. Thoroughly rinse your body with warm water from the neck down.  8. DO NOT shower/wash with your normal soap after using and rinsing off the CHG Soap.  9. Pat yourself dry with a CLEAN TOWEL.  10. Wear CLEAN PAJAMAS to bed the night before surgery, wear comfortable clothes the morning of surgery  11. Place CLEAN SHEETS  on your bed the night of your first shower and DO NOT SLEEP WITH PETS.   Day of Surgery:            Remember to brush your teeth WITH YOUR REGULAR TOOTHPASTE.  Do not wear jewelry.  Do not wear lotions, powders, colognes, or deodorant.  Do not shave 48 hours prior to surgery.  Men may shave face and neck.  Do not bring valuables to the hospital.  [facility] is not responsible for any belongings or valuables.  Contacts, dentures or bridgework may not be worn into surgery.    For patients admitted  to the hospital, discharge time will be determined by your treatment team.  Patients discharged the day of surgery will not be allowed to drive home, and someone age 90 and over needs to stay with them for 24 hours.   Please wear clean clothes to the hospital/surgery center.    Please read over the following fact sheets that you were given.

## 2019-03-27 ENCOUNTER — Encounter (HOSPITAL_COMMUNITY): Payer: Self-pay

## 2019-03-27 ENCOUNTER — Encounter (HOSPITAL_COMMUNITY)
Admission: RE | Admit: 2019-03-27 | Discharge: 2019-03-27 | Disposition: A | Discharge status: Home / Self Care | Payer: 59 | Source: Ambulatory Visit | Attending: Vascular Surgery | Admitting: Vascular Surgery

## 2019-03-27 ENCOUNTER — Other Ambulatory Visit: Payer: Self-pay

## 2019-03-27 DIAGNOSIS — D649 Anemia, unspecified: Secondary | ICD-10-CM | POA: Diagnosis not present

## 2019-03-27 DIAGNOSIS — Z01812 Encounter for preprocedural laboratory examination: Secondary | ICD-10-CM | POA: Insufficient documentation

## 2019-03-27 DIAGNOSIS — Z951 Presence of aortocoronary bypass graft: Secondary | ICD-10-CM | POA: Diagnosis not present

## 2019-03-27 DIAGNOSIS — I714 Abdominal aortic aneurysm, without rupture: Secondary | ICD-10-CM | POA: Insufficient documentation

## 2019-03-27 DIAGNOSIS — I2510 Atherosclerotic heart disease of native coronary artery without angina pectoris: Secondary | ICD-10-CM | POA: Insufficient documentation

## 2019-03-27 LAB — CBC
HCT: 34.9 % — ABNORMAL LOW (ref 39.0–52.0)
Hemoglobin: 11 g/dL — ABNORMAL LOW (ref 13.0–17.0)
MCH: 24.9 pg — ABNORMAL LOW (ref 26.0–34.0)
MCHC: 31.5 g/dL (ref 30.0–36.0)
MCV: 79.1 fL — ABNORMAL LOW (ref 80.0–100.0)
Platelets: 103 10*3/uL — ABNORMAL LOW (ref 150–400)
RBC: 4.41 MIL/uL (ref 4.22–5.81)
RDW: 15.7 % — ABNORMAL HIGH (ref 11.5–15.5)
WBC: 4.2 10*3/uL (ref 4.0–10.5)
nRBC: 0 % (ref 0.0–0.2)

## 2019-03-27 LAB — PROTIME-INR
INR: 1.1 (ref 0.8–1.2)
Prothrombin Time: 14.4 seconds (ref 11.4–15.2)

## 2019-03-27 LAB — COMPREHENSIVE METABOLIC PANEL
ALT: 23 U/L (ref 0–44)
AST: 23 U/L (ref 15–41)
Albumin: 3.4 g/dL — ABNORMAL LOW (ref 3.5–5.0)
Alkaline Phosphatase: 50 U/L (ref 38–126)
Anion gap: 9 (ref 5–15)
BUN: 18 mg/dL (ref 6–20)
CO2: 26 mmol/L (ref 22–32)
Calcium: 8.6 mg/dL — ABNORMAL LOW (ref 8.9–10.3)
Chloride: 106 mmol/L (ref 98–111)
Creatinine, Ser: 1.24 mg/dL (ref 0.61–1.24)
GFR calc Af Amer: >60 mL/min (ref >60)
GFR calc non Af Amer: >60 mL/min (ref >60)
Glucose, Bld: 134 mg/dL — ABNORMAL HIGH (ref 70–99)
Potassium: 3.7 mmol/L (ref 3.5–5.1)
Sodium: 141 mmol/L (ref 135–145)
Total Bilirubin: 0.7 mg/dL (ref 0.3–1.2)
Total Protein: 7.2 g/dL (ref 6.5–8.1)

## 2019-03-27 LAB — SURGICAL PCR SCREEN
MRSA, PCR: POSITIVE — AB
Staphylococcus aureus: POSITIVE — AB

## 2019-03-27 LAB — APTT: aPTT: 29 seconds (ref 24–36)

## 2019-03-27 NOTE — Progress Notes (Signed)
PCP - Lajean Manes, MD Cardiologist - Daneen Schick, MD  Chest x-ray - N/A EKG - 05/08/18 Stress Test - 03/19/19 ECHO - 11/26/16 Cardiac Cath - 07/15/17  Sleep Study - years ago; states no longer an issue CPAP - No  Blood Thinner Instructions: N/A Aspirin Instructions: Pt instructed to call Dr. Oneida Alar office for clarification  COVID TEST- scheduled 03/30/19; patient instructed on need for self-quarantine; states verbal understanding  Coronavirus Screening  Have you experienced the following symptoms:  Cough yes/no: No Fever (>100.53F)  yes/no: No Runny nose yes/no: No Sore throat yes/no: No Difficulty breathing/shortness of breath  yes/no: No  Have you or a family member traveled in the last 14 days and where? yes/no: No  If the patient indicates "YES" to the above questions, their PAT will be rescheduled to limit the exposure to others and, the surgeon will be notified. THE PATIENT WILL NEED TO BE ASYMPTOMATIC FOR 14 DAYS.   If the patient is not experiencing any of these symptoms, the PAT nurse will instruct them to NOT bring anyone with them to their appointment since they may have these symptoms or traveled as well.   Please remind your patients and families that hospital visitation restrictions are in effect and the importance of the restrictions.   Anesthesia review: Yes; aneurysm 8.8cm; hx CABG; hx other multiple aneurysms including coronary artery, bilateral popliteal, and gluteal artery aneurysms; hx CKD 2; hx gastric bypass  Patient denies shortness of breath, fever, cough and chest pain at PAT appointment  All instructions explained to the patient, with a verbal understanding of the material. Patient agrees to go over the instructions while at home for a better understanding. Patient also instructed to self quarantine after being tested for COVID-19. The opportunity to ask questions was provided.

## 2019-03-28 NOTE — Progress Notes (Signed)
Anesthesia Chart Review: Follows with cardiology for hx of CAD s/p CABG 2001, successful embolization of RCA aneurysm on 07/16/18. Cardiac clearance per Dr. Tamala Julian 03/23/19, "Let the patient know the nuclear study shows no evidence of decreased blood flow to the heart. Ejection fraction is 35 to 40% which raises the risk assessment intermediate.  I believe the EF is erroneous.  He has had no events since his last LV assessment by cath and echo which were within the past 18 months.  Wall motion studies binucleate can be falsely depressed.  He is cleared to proceed with vascular surgery as outlined by Dr. Oneida Alar. A copy will be sent to Lajean Manes, MD."  Follows with [facility] for hx of AAA. Per notes, he has had continued expansion of infrarenal abdominal aortic aneurysm despite multiple coil embolizations proximal cuff status post stent graft repair.  The aneurysm is now almost 9 cm in diameter. Dr. Abbott Pao now requires explant of the stent graft and traditional dacron abdominal aortic aneurysm repair.  Preop labs reviewed. Mild anemia with Hgb 11.0.   EKG 05/08/18: Sinus rhythm with PAC bigeminy. Rate 62. Minimal voltage criteria for LVH, may be normal variant. Possible inferior infarct, age undetermined.   Nuclear stress 03/19/19:  Nuclear stress EF: 38%.  There was no ST segment deviation noted during stress.  The study is normal.  This is an intermediate risk study.  The left ventricular ejection fraction is moderately decreased (30-44%).   1.  This is a normal myocardial imaging perfusion study without evidence of ischemia or prior infarction. 2.  Moderately dilated left ventricle with moderately reduced function, ejection fraction 38% 3.  Global LV hypokinesis was observed on this study. 4.  Overall this is an intermediate risk study due to the reduced ejection fraction.  When compared with the study from 04/03/2014, it appears he had a similar ejection fraction but normal perfusion imaging similar  to the study.  There is no change from prior.  Would recommend to confirm left ventricular function with an echocardiogram.  Cath 06/09/17 (pt subsequently underwent RCA embolization)  Severe aneurysmal coronary disease with a massive native right coronary aneurysm in the mid segment.  There is also a large distal left main aneurysm.  RCA aneurysm measures greater than 20 mm in diameter in the left main aneurysm less than 20 mm.  Total occlusion of the mid to distal RCA.  Distal vessel fills by SVG sequential graft.  Total occlusion of the native circumflex.  Distal vessel fills by collaterals.  80% proximal tubular LAD stenosis with competitive flow due to patent LIMA.  Segmental 60% proximal ramus obstruction  Patent sequential saphenous vein graft to the PDA.  Patent LIMA to the LAD.  No other saphenous vein grafts could be identified.  SVG to diagonal is felt to be occluded.  Left ventricular function is normal.  Hemodynamics are normal.  EF 55%.  RECOMMENDATIONS:   Heart team approach.  Consider endovascular therapy with Graftmaster versus surgical ligation and consideration of repeat coronary bypass.   Wynonia Musty [facility]/Anesthesiology Phone [phone] 03/28/2019 10:02 AM

## 2019-03-28 NOTE — Anesthesia Preprocedure Evaluation (Addendum)
Anesthesia Evaluation  Patient identified by MRN, date of birth, ID band Patient awake    Reviewed: Allergy & Precautions, NPO status , Patient's Chart, lab work & pertinent test results, reviewed documented beta blocker date and time   Airway Mallampati: II  TM Distance: >3 FB Neck ROM: Full    Dental  (+) Lower Dentures, Upper Dentures, Dental Advisory Given   Pulmonary neg pulmonary ROS,    Pulmonary exam normal breath sounds clear to auscultation       Cardiovascular hypertension, Pt. on medications and Pt. on home beta blockers + CAD and + CABG  Normal cardiovascular exam Rhythm:Regular Rate:Normal     Neuro/Psych PSYCHIATRIC DISORDERS Depression negative neurological ROS     GI/Hepatic negative GI ROS, Neg liver ROS,   Endo/Other  negative endocrine ROS  Renal/GU CRFRenal disease     Musculoskeletal negative musculoskeletal ROS (+)   Abdominal (+) + obese,   Peds  Hematology  (+) anemia , HLD   Anesthesia Other Findings  abdominal aortic aneurysm  Reproductive/Obstetrics                          Anesthesia Physical Anesthesia Plan  ASA: III  Anesthesia Plan: General   Post-op Pain Management:    Induction: Intravenous  PONV Risk Score and Plan: 2 and Ondansetron, Dexamethasone, Midazolam and Treatment may vary due to age or medical condition  Airway Management Planned: Oral ETT  Additional Equipment: Arterial line, CVP and Ultrasound Guidance Line Placement  Intra-op Plan:   Post-operative Plan: Extubation in OR and Possible Post-op intubation/ventilation  Informed Consent: I have reviewed the patients History and Physical, chart, labs and discussed the procedure including the risks, benefits and alternatives for the proposed anesthesia with the patient or authorized representative who has indicated his/her understanding and acceptance.       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 Cardiac clearance per Dr. Tamala Julian 03/23/19, "Let the patient know the nuclear study shows no evidence of decreased blood flow to the heart. Ejection fraction is 35 to 40% which raises the risk assessment intermediate.  I believe the EF is erroneous.  He has had no events since his last LV assessment by cath and echo which were within the past 18 months.  Wall motion studies binucleate can be falsely depressed.  He is cleared to proceed with vascular surgery as outlined by Dr. Oneida Alar. A copy will be sent to Lajean Manes, MD."  Follows with [facility] for hx of AAA. Per notes, he has had continued expansion of infrarenal abdominal aortic aneurysm despite multiple coil embolizations proximal cuff status post stent graft repair.  The aneurysm is now almost 9 cm in diameter. Dr. Abbott Pao now requires explant of the stent graft and traditional dacron abdominal aortic aneurysm repair.  Preop labs reviewed. Mild anemia with Hgb 11.0.   EKG 05/08/18: Sinus rhythm with PAC bigeminy. Rate 62. Minimal voltage criteria for LVH, may be normal variant. Possible inferior infarct, age undetermined.   Nuclear stress 03/19/19: Nuclear stress EF: 38%. There was no ST segment deviation noted during stress. The study is normal. This is an intermediate risk study. The left ventricular ejection fraction is moderately decreased (30-44%).   1.  This is a normal myocardial imaging perfusion study without evidence of ischemia or prior infarction. 2.  Moderately dilated left ventricle  with moderately reduced function, ejection fraction 38% 3.  Global LV hypokinesis was observed on this study. 4.  Overall this is an intermediate risk study due to the reduced ejection fraction.  When compared with the study from 04/03/2014, it appears he had a similar ejection fraction but normal perfusion imaging  similar to the study.  There is no change from prior.  Would recommend to confirm left ventricular function with an echocardiogram.  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  There is also a large distal left main aneurysm.  RCA aneurysm measures greater than 20 mm in diameter in the left main aneurysm less than 20 mm. Total occlusion of the mid to distal RCA.  Distal vessel fills by SVG sequential graft. Total occlusion of the native circumflex.  Distal vessel fills by collaterals. 80% proximal tubular LAD stenosis with competitive flow due to patent LIMA. Segmental 60% proximal ramus obstruction Patent sequential saphenous vein graft to the PDA. Patent LIMA to the LAD. No other saphenous vein grafts could be identified.  SVG to diagonal is felt to be occluded. Left ventricular function is normal.  Hemodynamics are normal.  EF 55%.  RECOMMENDATIONS:  Heart team approach.  Consider endovascular therapy with Graftmaster versus surgical ligation and consideration of repeat coronary bypass.)     Anesthesia Quick Evaluation

## 2019-03-30 ENCOUNTER — Other Ambulatory Visit (HOSPITAL_COMMUNITY)
Admission: RE | Admit: 2019-03-30 | Discharge: 2019-03-30 | Disposition: A | Discharge status: Home / Self Care | Payer: No Typology Code available for payment source | Source: Ambulatory Visit | Attending: Vascular Surgery | Admitting: Vascular Surgery

## 2019-03-30 DIAGNOSIS — Z20828 Contact with and (suspected) exposure to other viral communicable diseases: Secondary | ICD-10-CM | POA: Insufficient documentation

## 2019-03-30 DIAGNOSIS — Z01812 Encounter for preprocedural laboratory examination: Secondary | ICD-10-CM | POA: Insufficient documentation

## 2019-03-30 LAB — SARS CORONAVIRUS 2 (TAT 6-24 HRS): SARS Coronavirus 2: NEGATIVE

## 2019-03-30 MED ORDER — DEXTROSE 5 % IV SOLN
3.0000 g | INTRAVENOUS | Status: AC
  Administered 2019-04-02: 3 g via INTRAVENOUS
  Filled 2019-03-30 (×3): qty 3000

## 2019-04-02 ENCOUNTER — Inpatient Hospital Stay (HOSPITAL_COMMUNITY): Payer: No Typology Code available for payment source | Admitting: Physician Assistant

## 2019-04-02 ENCOUNTER — Encounter (HOSPITAL_COMMUNITY): Admission: RE | Disposition: E | Payer: Self-pay | Source: Home / Self Care | Attending: Vascular Surgery

## 2019-04-02 ENCOUNTER — Inpatient Hospital Stay (HOSPITAL_COMMUNITY): Payer: No Typology Code available for payment source

## 2019-04-02 ENCOUNTER — Inpatient Hospital Stay (HOSPITAL_COMMUNITY)
Admission: RE | Admit: 2019-04-02 | Discharge: 2019-04-03 | DRG: 253 | Disposition: E | Payer: No Typology Code available for payment source | Attending: Vascular Surgery | Admitting: Vascular Surgery

## 2019-04-02 ENCOUNTER — Encounter (HOSPITAL_COMMUNITY): Payer: Self-pay

## 2019-04-02 ENCOUNTER — Other Ambulatory Visit: Payer: Self-pay

## 2019-04-02 DIAGNOSIS — Z79899 Other long term (current) drug therapy: Secondary | ICD-10-CM

## 2019-04-02 DIAGNOSIS — F329 Major depressive disorder, single episode, unspecified: Secondary | ICD-10-CM | POA: Diagnosis present

## 2019-04-02 DIAGNOSIS — I129 Hypertensive chronic kidney disease with stage 1 through stage 4 chronic kidney disease, or unspecified chronic kidney disease: Secondary | ICD-10-CM | POA: Diagnosis present

## 2019-04-02 DIAGNOSIS — Z951 Presence of aortocoronary bypass graft: Secondary | ICD-10-CM

## 2019-04-02 DIAGNOSIS — Z20828 Contact with and (suspected) exposure to other viral communicable diseases: Secondary | ICD-10-CM | POA: Diagnosis present

## 2019-04-02 DIAGNOSIS — N182 Chronic kidney disease, stage 2 (mild): Secondary | ICD-10-CM | POA: Diagnosis present

## 2019-04-02 DIAGNOSIS — H9191 Unspecified hearing loss, right ear: Secondary | ICD-10-CM | POA: Diagnosis present

## 2019-04-02 DIAGNOSIS — Q272 Other congenital malformations of renal artery: Secondary | ICD-10-CM

## 2019-04-02 DIAGNOSIS — E669 Obesity, unspecified: Secondary | ICD-10-CM | POA: Diagnosis present

## 2019-04-02 DIAGNOSIS — Z9884 Bariatric surgery status: Secondary | ICD-10-CM | POA: Diagnosis not present

## 2019-04-02 DIAGNOSIS — Y832 Surgical operation with anastomosis, bypass or graft as the cause of abnormal reaction of the patient, or of later complication, without mention of misadventure at the time of the procedure: Secondary | ICD-10-CM | POA: Diagnosis not present

## 2019-04-02 DIAGNOSIS — Z7982 Long term (current) use of aspirin: Secondary | ICD-10-CM | POA: Diagnosis not present

## 2019-04-02 DIAGNOSIS — Z955 Presence of coronary angioplasty implant and graft: Secondary | ICD-10-CM

## 2019-04-02 DIAGNOSIS — E785 Hyperlipidemia, unspecified: Secondary | ICD-10-CM | POA: Diagnosis present

## 2019-04-02 DIAGNOSIS — I97711 Intraoperative cardiac arrest during other surgery: Secondary | ICD-10-CM | POA: Diagnosis not present

## 2019-04-02 DIAGNOSIS — I2510 Atherosclerotic heart disease of native coronary artery without angina pectoris: Secondary | ICD-10-CM | POA: Diagnosis present

## 2019-04-02 DIAGNOSIS — Z6836 Body mass index (BMI) 36.0-36.9, adult: Secondary | ICD-10-CM

## 2019-04-02 DIAGNOSIS — Z8679 Personal history of other diseases of the circulatory system: Secondary | ICD-10-CM | POA: Diagnosis not present

## 2019-04-02 DIAGNOSIS — Y92234 Operating room of hospital as the place of occurrence of the external cause: Secondary | ICD-10-CM | POA: Diagnosis not present

## 2019-04-02 DIAGNOSIS — I714 Abdominal aortic aneurysm, without rupture: Secondary | ICD-10-CM | POA: Diagnosis present

## 2019-04-02 DIAGNOSIS — Z87442 Personal history of urinary calculi: Secondary | ICD-10-CM | POA: Diagnosis not present

## 2019-04-02 HISTORY — PX: ABDOMINAL AORTIC ANEURYSM REPAIR: SHX42

## 2019-04-02 HISTORY — PX: INTRAOPERATIVE TRANSESOPHAGEAL ECHOCARDIOGRAM: SHX5062

## 2019-04-02 LAB — PREPARE RBC (CROSSMATCH)

## 2019-04-02 LAB — ECHO INTRAOPERATIVE TEE
Height: 71 in
Weight: 4240 oz

## 2019-04-02 LAB — URINALYSIS, ROUTINE W REFLEX MICROSCOPIC
Bilirubin Urine: NEGATIVE
Glucose, UA: NEGATIVE mg/dL
Hgb urine dipstick: NEGATIVE
Ketones, ur: NEGATIVE mg/dL
Leukocytes,Ua: NEGATIVE
Nitrite: NEGATIVE
Protein, ur: NEGATIVE mg/dL
Specific Gravity, Urine: 1.016 (ref 1.005–1.030)
pH: 6 (ref 5.0–8.0)

## 2019-04-02 SURGERY — ANEURYSM ABDOMINAL AORTIC REPAIR
Anesthesia: General | Site: Abdomen

## 2019-04-02 MED ORDER — ACETAMINOPHEN 500 MG PO TABS
ORAL_TABLET | ORAL | Status: AC
  Administered 2019-04-02: 1000 mg via ORAL
  Filled 2019-04-02: qty 2

## 2019-04-02 MED ORDER — MANNITOL 25 % IV SOLN
INTRAVENOUS | Status: DC | PRN
  Administered 2019-04-02: 12.5 g via INTRAVENOUS

## 2019-04-02 MED ORDER — ROCURONIUM BROMIDE 10 MG/ML (PF) SYRINGE
PREFILLED_SYRINGE | INTRAVENOUS | Status: AC
  Filled 2019-04-02: qty 10

## 2019-04-02 MED ORDER — FENTANYL CITRATE (PF) 250 MCG/5ML IJ SOLN
INTRAMUSCULAR | Status: DC | PRN
  Administered 2019-04-02 (×3): 50 ug via INTRAVENOUS
  Administered 2019-04-02: 100 ug via INTRAVENOUS
  Administered 2019-04-02 (×3): 50 ug via INTRAVENOUS

## 2019-04-02 MED ORDER — MIDAZOLAM HCL 2 MG/2ML IJ SOLN
INTRAMUSCULAR | Status: AC
  Filled 2019-04-02: qty 2

## 2019-04-02 MED ORDER — NOREPINEPHRINE 4 MG/250ML-% IV SOLN
0.0000 ug/min | INTRAVENOUS | Status: DC
  Filled 2019-04-02: qty 250

## 2019-04-02 MED ORDER — GLYCOPYRROLATE PF 0.2 MG/ML IJ SOSY
PREFILLED_SYRINGE | INTRAMUSCULAR | Status: DC | PRN
  Administered 2019-04-02: .2 mg via INTRAVENOUS

## 2019-04-02 MED ORDER — LACTATED RINGERS IV SOLN
INTRAVENOUS | Status: DC | PRN
  Administered 2019-04-02: 07:00:00 via INTRAVENOUS

## 2019-04-02 MED ORDER — LACTATED RINGERS IV SOLN
INTRAVENOUS | Status: DC | PRN
  Administered 2019-04-02 (×2): via INTRAVENOUS

## 2019-04-02 MED ORDER — VASOPRESSIN 20 UNIT/ML IV SOLN
0.0100 [IU]/min | INTRAVENOUS | Status: DC
  Filled 2019-04-02: qty 2

## 2019-04-02 MED ORDER — GLYCOPYRROLATE PF 0.2 MG/ML IJ SOSY
PREFILLED_SYRINGE | INTRAMUSCULAR | Status: AC
  Filled 2019-04-02: qty 1

## 2019-04-02 MED ORDER — FENTANYL CITRATE (PF) 250 MCG/5ML IJ SOLN
INTRAMUSCULAR | Status: AC
  Filled 2019-04-02: qty 5

## 2019-04-02 MED ORDER — VASOPRESSIN 20 UNIT/ML IV SOLN
INTRAVENOUS | Status: DC | PRN
  Administered 2019-04-02: 10 [IU] via INTRAVENOUS

## 2019-04-02 MED ORDER — HEPARIN SODIUM (PORCINE) 1000 UNIT/ML IJ SOLN
INTRAMUSCULAR | Status: AC
  Filled 2019-04-02: qty 1

## 2019-04-02 MED ORDER — MIDAZOLAM HCL 5 MG/5ML IJ SOLN
INTRAMUSCULAR | Status: DC | PRN
  Administered 2019-04-02 (×2): 1 mg via INTRAVENOUS

## 2019-04-02 MED ORDER — VASOPRESSIN 20 UNIT/ML IV SOLN
INTRAVENOUS | Status: AC
  Filled 2019-04-02: qty 1

## 2019-04-02 MED ORDER — SODIUM CHLORIDE 0.9 % IV SOLN
INTRAVENOUS | Status: DC | PRN
  Administered 2019-04-02: 500 mL

## 2019-04-02 MED ORDER — PHENYLEPHRINE HCL-NACL 10-0.9 MG/250ML-% IV SOLN
INTRAVENOUS | Status: DC | PRN
  Administered 2019-04-02: 20 ug/min via INTRAVENOUS
  Administered 2019-04-02: 12:00:00 via INTRAVENOUS

## 2019-04-02 MED ORDER — SODIUM CHLORIDE 0.9 % IV SOLN
INTRAVENOUS | Status: AC
  Filled 2019-04-02: qty 1.2

## 2019-04-02 MED ORDER — CHLORHEXIDINE GLUCONATE CLOTH 2 % EX PADS: 6.0000 | MEDICATED_PAD | Freq: Once | CUTANEOUS | Status: DC

## 2019-04-02 MED ORDER — CEFAZOLIN SODIUM 1 G IJ SOLR
INTRAMUSCULAR | Status: AC
  Filled 2019-04-02: qty 30

## 2019-04-02 MED ORDER — PROPOFOL 10 MG/ML IV BOLUS
INTRAVENOUS | Status: DC | PRN
  Administered 2019-04-02 (×3): 50 mg via INTRAVENOUS

## 2019-04-02 MED ORDER — DEXAMETHASONE SODIUM PHOSPHATE 10 MG/ML IJ SOLN
INTRAMUSCULAR | Status: AC
  Filled 2019-04-02: qty 1

## 2019-04-02 MED ORDER — DEXAMETHASONE SODIUM PHOSPHATE 10 MG/ML IJ SOLN
INTRAMUSCULAR | Status: DC | PRN
  Administered 2019-04-02: 10 mg via INTRAVENOUS

## 2019-04-02 MED ORDER — ALBUMIN HUMAN 5 % IV SOLN
INTRAVENOUS | Status: DC | PRN
  Administered 2019-04-02 (×3): via INTRAVENOUS

## 2019-04-02 MED ORDER — EPHEDRINE SULFATE-NACL 50-0.9 MG/10ML-% IV SOSY
PREFILLED_SYRINGE | INTRAVENOUS | Status: DC | PRN
  Administered 2019-04-02 (×2): 5 mg via INTRAVENOUS

## 2019-04-02 MED ORDER — CALCIUM CHLORIDE 10 % IV SOLN
INTRAVENOUS | Status: DC | PRN
  Administered 2019-04-02 (×2): 10 mg via INTRAVENOUS

## 2019-04-02 MED ORDER — PROPOFOL 10 MG/ML IV BOLUS
INTRAVENOUS | Status: AC
  Filled 2019-04-02: qty 20

## 2019-04-02 MED ORDER — LIDOCAINE 2% (20 MG/ML) 5 ML SYRINGE
INTRAMUSCULAR | Status: AC
  Filled 2019-04-02: qty 5

## 2019-04-02 MED ORDER — EPINEPHRINE 1 MG/10ML IJ SOSY
PREFILLED_SYRINGE | INTRAMUSCULAR | Status: DC | PRN
  Administered 2019-04-02 (×5): 1 mg via INTRAVENOUS

## 2019-04-02 MED ORDER — EPHEDRINE 5 MG/ML INJ
INTRAVENOUS | Status: AC
  Filled 2019-04-02: qty 10

## 2019-04-02 MED ORDER — PHENYLEPHRINE HCL (PRESSORS) 10 MG/ML IV SOLN
INTRAVENOUS | Status: AC
  Filled 2019-04-02: qty 1

## 2019-04-02 MED ORDER — PHENYLEPHRINE 40 MCG/ML (10ML) SYRINGE FOR IV PUSH (FOR BLOOD PRESSURE SUPPORT)
PREFILLED_SYRINGE | INTRAVENOUS | Status: DC | PRN
  Administered 2019-04-02: 120 ug via INTRAVENOUS
  Administered 2019-04-02: 40 ug via INTRAVENOUS
  Administered 2019-04-02 (×2): 80 ug via INTRAVENOUS

## 2019-04-02 MED ORDER — ROCURONIUM BROMIDE 10 MG/ML (PF) SYRINGE
PREFILLED_SYRINGE | INTRAVENOUS | Status: DC | PRN
  Administered 2019-04-02: 50 mg via INTRAVENOUS
  Administered 2019-04-02: 60 mg via INTRAVENOUS
  Administered 2019-04-02: 40 mg via INTRAVENOUS
  Administered 2019-04-02: 50 mg via INTRAVENOUS

## 2019-04-02 MED ORDER — SODIUM CHLORIDE 0.9% IV SOLUTION: Freq: Once | INTRAVENOUS | Status: DC

## 2019-04-02 MED ORDER — ACETAMINOPHEN 500 MG PO TABS
1000.0000 mg | ORAL_TABLET | Freq: Once | ORAL | Status: AC
  Administered 2019-04-02: 06:00:00 1000 mg via ORAL

## 2019-04-02 MED ORDER — HEPARIN SODIUM (PORCINE) 1000 UNIT/ML IJ SOLN
INTRAMUSCULAR | Status: DC | PRN
  Administered 2019-04-02: 10000 [IU] via INTRAVENOUS
  Administered 2019-04-02: 12000 [IU] via INTRAVENOUS

## 2019-04-02 MED ORDER — 0.9 % SODIUM CHLORIDE (POUR BTL) OPTIME
TOPICAL | Status: DC | PRN
  Administered 2019-04-02: 3000 mL
  Administered 2019-04-02: 1000 mL

## 2019-04-02 MED ORDER — SODIUM CHLORIDE 0.9 % IV SOLN: INTRAVENOUS | Status: DC

## 2019-04-02 MED ORDER — EPINEPHRINE 1 MG/10ML IJ SOSY
PREFILLED_SYRINGE | INTRAMUSCULAR | Status: AC
  Filled 2019-04-02: qty 20

## 2019-04-02 MED ORDER — ONDANSETRON HCL 4 MG/2ML IJ SOLN
INTRAMUSCULAR | Status: AC
  Filled 2019-04-02: qty 2

## 2019-04-02 MED ORDER — CALCIUM CHLORIDE 10 % IV SOLN
INTRAVENOUS | Status: AC
  Filled 2019-04-02: qty 10

## 2019-04-02 SURGICAL SUPPLY — 60 items
ATTRACTOMAT 16X20 MAGNETIC DRP (DRAPES) IMPLANT
BAG ISOLATION DRAPE 18X18 (DRAPES) ×4 IMPLANT
CANISTER SUCT 3000ML PPV (MISCELLANEOUS) ×3 IMPLANT
CANNULA VESSEL 3MM 2 BLNT TIP (CANNULA) ×6 IMPLANT
CATH EMB 5FR 80CM (CATHETERS) ×6 IMPLANT
CLIP VESOCCLUDE LG 6/CT (CLIP) ×6 IMPLANT
CLIP VESOCCLUDE MED 24/CT (CLIP) ×3 IMPLANT
CLIP VESOCCLUDE SM WIDE 24/CT (CLIP) ×3 IMPLANT
COVER WAND RF STERILE (DRAPES) IMPLANT
DERMABOND ADVANCED (GAUZE/BANDAGES/DRESSINGS) ×2
DERMABOND ADVANCED .7 DNX12 (GAUZE/BANDAGES/DRESSINGS) ×4 IMPLANT
DRAPE HALF SHEET 40X57 (DRAPES) ×6 IMPLANT
DRAPE ISOLATION BAG 18X18 (DRAPES) ×2
ELECT BLADE 4.0 EZ CLEAN MEGAD (MISCELLANEOUS) ×3
ELECT BLADE 6.5 EXT (BLADE) IMPLANT
ELECT REM PT RETURN 9FT ADLT (ELECTROSURGICAL) ×6
ELECTRODE BLDE 4.0 EZ CLN MEGD (MISCELLANEOUS) ×2 IMPLANT
ELECTRODE REM PT RTRN 9FT ADLT (ELECTROSURGICAL) ×4 IMPLANT
FELT TEFLON 1X6 (MISCELLANEOUS) ×3 IMPLANT
GAUZE SPONGE 4X4 12PLY STRL (GAUZE/BANDAGES/DRESSINGS) ×6 IMPLANT
GLOVE BIO SURGEON STRL SZ 6.5 (GLOVE) ×15 IMPLANT
GLOVE BIO SURGEON STRL SZ7.5 (GLOVE) ×6 IMPLANT
GLOVE ECLIPSE 6.5 STRL STRAW (GLOVE) ×9 IMPLANT
GOWN SPEC L4 XLG W/TWL (GOWN DISPOSABLE) ×3 IMPLANT
GOWN STRL REUS W/ TWL LRG LVL3 (GOWN DISPOSABLE) ×10 IMPLANT
GOWN STRL REUS W/TWL LRG LVL3 (GOWN DISPOSABLE) ×5
GRAFT HEMASHIELD 18X9MM (Vascular Products) IMPLANT
INSERT FOGARTY 61MM (MISCELLANEOUS) ×6 IMPLANT
INSERT FOGARTY SM (MISCELLANEOUS) ×12 IMPLANT
KIT BASIN OR (CUSTOM PROCEDURE TRAY) ×3 IMPLANT
KIT TURNOVER KIT B (KITS) ×3 IMPLANT
LOOP VESSEL MINI RED (MISCELLANEOUS) IMPLANT
NS IRRIG 1000ML POUR BTL (IV SOLUTION) ×12 IMPLANT
PACK AORTA (CUSTOM PROCEDURE TRAY) ×3 IMPLANT
PAD ARMBOARD 7.5X6 YLW CONV (MISCELLANEOUS) ×6 IMPLANT
RETAINER VISCERA MED (MISCELLANEOUS) ×3 IMPLANT
SPONGE LAP 18X18 RF (DISPOSABLE) IMPLANT
SPONGE LAP 18X18 X RAY DECT (DISPOSABLE) ×3 IMPLANT
SPONGE SURGIFOAM ABS GEL 100 (HEMOSTASIS) IMPLANT
STAPLER VISISTAT 35W (STAPLE) ×6 IMPLANT
STOPCOCK 4 WAY LG BORE MALE ST (IV SETS) ×3 IMPLANT
SUT PDS AB 1 TP1 54 (SUTURE) ×6 IMPLANT
SUT PROLENE 3 0 SH DA (SUTURE) ×6 IMPLANT
SUT PROLENE 3 0 SH1 36 (SUTURE) ×3 IMPLANT
SUT PROLENE 5 0 C 1 36 (SUTURE) ×9 IMPLANT
SUT PROLENE 5 0 CC 1 (SUTURE) IMPLANT
SUT PROLENE 6 0 C 1 30 (SUTURE) ×3 IMPLANT
SUT PROLENE 6 0 CC (SUTURE) ×3 IMPLANT
SUT SILK 2 0SH CR/8 30 (SUTURE) ×3 IMPLANT
SUT VIC AB 2-0 SH 27 (SUTURE)
SUT VIC AB 2-0 SH 27XBRD (SUTURE) IMPLANT
SUT VIC AB 3-0 SH 27 (SUTURE) ×4
SUT VIC AB 3-0 SH 27X BRD (SUTURE) ×8 IMPLANT
SUT VIC AB 4-0 PS2 27 (SUTURE) ×3 IMPLANT
SYR 3ML LL SCALE MARK (SYRINGE) ×6 IMPLANT
TAPE UMBILICAL COTTON 1/8X30 (MISCELLANEOUS) IMPLANT
TOWEL GREEN STERILE (TOWEL DISPOSABLE) ×9 IMPLANT
TOWEL SURG RFD BLUE STRL DISP (DISPOSABLE) IMPLANT
TRAY FOLEY MTR SLVR 16FR STAT (SET/KITS/TRAYS/PACK) ×3 IMPLANT
WATER STERILE IRR 1000ML POUR (IV SOLUTION) ×3 IMPLANT

## 2019-04-03 ENCOUNTER — Encounter (HOSPITAL_COMMUNITY): Payer: Self-pay | Admitting: Vascular Surgery

## 2019-04-03 LAB — BPAM RBC
Blood Product Expiration Date: 202012272359
Blood Product Expiration Date: 202012282359
Blood Product Expiration Date: 202012282359
Blood Product Expiration Date: 202012282359
Blood Product Expiration Date: 202012292359
Blood Product Expiration Date: 202012292359
Blood Product Expiration Date: 202012292359
Blood Product Expiration Date: 202012292359
Blood Product Expiration Date: 202012292359
Blood Product Expiration Date: 202012292359
ISSUE DATE / TIME: 202011300623
ISSUE DATE / TIME: 202011300623
ISSUE DATE / TIME: 202011300623
ISSUE DATE / TIME: 202011300623
ISSUE DATE / TIME: 202011301207
ISSUE DATE / TIME: 202011301207
ISSUE DATE / TIME: 202011301207
ISSUE DATE / TIME: 202011301207
Unit Type and Rh: 5100
Unit Type and Rh: 5100
Unit Type and Rh: 5100
Unit Type and Rh: 5100
Unit Type and Rh: 5100
Unit Type and Rh: 5100
Unit Type and Rh: 5100
Unit Type and Rh: 5100
Unit Type and Rh: 5100
Unit Type and Rh: 5100

## 2019-04-03 LAB — POCT I-STAT 7, (LYTES, BLD GAS, ICA,H+H)
Acid-Base Excess: 2 mmol/L (ref 0.0–2.0)
Acid-base deficit: 3 mmol/L — ABNORMAL HIGH (ref 0.0–2.0)
Bicarbonate: 27.5 mmol/L (ref 20.0–28.0)
Bicarbonate: 22.9 mmol/L (ref 20.0–28.0)
Calcium, Ion: 1.13 mmol/L — ABNORMAL LOW (ref 1.15–1.40)
Calcium, Ion: 1.04 mmol/L — ABNORMAL LOW (ref 1.15–1.40)
HCT: 30 % — ABNORMAL LOW (ref 39.0–52.0)
HCT: 27 % — ABNORMAL LOW (ref 39.0–52.0)
Hemoglobin: 10.2 g/dL — ABNORMAL LOW (ref 13.0–17.0)
Hemoglobin: 9.2 g/dL — ABNORMAL LOW (ref 13.0–17.0)
O2 Saturation: 100 %
O2 Saturation: 100 %
Patient temperature: 35.5
Patient temperature: 35.7
Potassium: 3.8 mmol/L (ref 3.5–5.1)
Potassium: 4.4 mmol/L (ref 3.5–5.1)
Sodium: 141 mmol/L (ref 135–145)
Sodium: 139 mmol/L (ref 135–145)
TCO2: 29 mmol/L (ref 22–32)
TCO2: 24 mmol/L (ref 22–32)
pCO2 arterial: 43.6 mmHg (ref 32.0–48.0)
pCO2 arterial: 40.6 mmHg (ref 32.0–48.0)
pH, Arterial: 7.401 (ref 7.350–7.450)
pH, Arterial: 7.353 (ref 7.350–7.450)
pO2, Arterial: 180 mmHg — ABNORMAL HIGH (ref 83.0–108.0)
pO2, Arterial: 206 mmHg — ABNORMAL HIGH (ref 83.0–108.0)

## 2019-04-03 LAB — TYPE AND SCREEN
ABO/RH(D): O POS
Antibody Screen: NEGATIVE
Unit division: 0
Unit division: 0
Unit division: 0
Unit division: 0
Unit division: 0
Unit division: 0
Unit division: 0
Unit division: 0
Unit division: 0
Unit division: 0

## 2019-04-03 NOTE — Discharge Summary (Signed)
Physician Discharge Summary   Patient ID: Daniel Blevins [id number] 58 y.o. Sep 22, 1960  Admit date: 24-Apr-2019  Discharge date and time: 04-24-19  3:20 PM   Admitting Physician: Elam Dutch, MD   Discharge Physician: same  Admission Diagnoses: abdominal aortic aneurysm  Indication for Admission: Expanding abdominal aortic aneurysm 8.8 cm post endovascular stent graft repair  Hospital Course:  Mr. Daniel Blevins is a 58 year old male who was brought in as an outpatient due to an expanding abdominal aortic aneurysm despite several attempts at endovascular stent graft repair.  The patient developed pulseless electrical activity intraoperatively.  Details regarding CPR and ACLS can be found in Dr. Oneida Alar operative note.  Patient expired in the operating room.  Dr. Oneida Alar informed the family and consulted chaplain for support.  Disposition: Expired  Signed: Dagoberto Ligas 04-24-2019 3:38 PM

## 2019-04-03 NOTE — Interval H&P Note (Signed)
History and Physical Interval Note:  Apr 06, 2019 7:29 AM  Daniel Blevins  has presented today for surgery, with the diagnosis of abdominal aortic aneurysm.  The various methods of treatment have been discussed with the patient and family. After consideration of risks, benefits and other options for treatment, the patient has consented to  Procedure(s): ANEURYSM JUXTARENAL ABDOMINAL AORTIC REPAIR AND REMOVAL OF AORTIC GRAFT (N/A) as a surgical intervention.  The patient's history has been reviewed, patient examined, no change in status, stable for surgery.  I have reviewed the patient's chart and labs.  Questions were answered to the patient's satisfaction.     [name]

## 2019-04-03 NOTE — Progress Notes (Signed)
Risks reviewed with pt of rupture without repair 20% per year.  Risks of operation transfusion 20%, bowel ischemia 5-10%, death 3-5 percent, MI 5%, infection 1-2% among others.  [name], MD [facility] Office: [phone]

## 2019-04-03 NOTE — Op Note (Signed)
Procedure: Repair of abdominal aortic aneurysm with explant of Gore Excluder stent graft  Preoperative diagnosis: Expanding abdominal aortic aneurysm 8.8 cm post endovascular stent graft repair  Postoperative diagnosis: Same  Anesthesia: General  Assistant: Fortunato Curling, MD  Operative findings: #1 densely inflamed iliac arteries bilaterally  2.  Suprarenal clamp  3.  Moderately adherent proximal aspect of stent graft  Operative details: After obtaining informed consent, the patient was taken the operating.  The patient was placed in supine position operating table.  After induction of general anesthesia and endotracheal intubation a nasogastric tube and Foley catheter were placed.  Next patient was prepped and draped in usual sterile fashion from the nipple down to the feet.  A midline laparotomy incision was performed extending from the xiphoid to pubis.  Incision was carried down through the subcutaneous tissues and through the peritoneum.  There were no significant abdominal wall adhesions.  There was no significant obvious intra-abdominal pathology.  Small bowel was reflected the right colon was reflected the left retroperitoneum was entered with cautery.  Aneurysm was quite large which made dissection a little more tedious due to having to put traction on the aorta proximally and distally to assist in exposure.  An Omni retractor was put in place.  The left renal vein was identified and dissected free circumferentially.  It was clamped proximally and distally with Henley clamps and divided.  Each end was then oversewn with a running 5-0 Prolene suture.  Then proceeded to dissect out the suprarenal aorta to make a suitable spot for clamping.  The aorta was soft in character.  The left and right renal arteries were dissected free circumferentially and Vesseloops placed around these.  There was a small 1 mm accessory renal artery on both sides these were ligated and divided on both sides.  Superior  mesenteric artery was identified on the inferior surface but there was enough room to get a clamp below this and above the renals.  Dissection then carried down onto the iliac arteries.  There was much more inflammatory tissue on the anterior surface and posterior surface of the iliac arteries presumably from prior stent grafting.  On the left side we were able to dissect all the way down to the origins of the internal iliac and external iliac artery.  A vessel loop was placed around the left external iliac artery.  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  Attention was then turned to the right side.  The right side had much more inflammatory tissue especially at the iliac bifurcation.  I was able to free up the internal iliac artery again on the anterior two thirds surface.  I was also able to get out about a similar amount of the external iliac artery.  I did make an additional counterincision below through the retroperitoneum to see if the external iliac artery was less inflamed more distally.  However it was still very scarred and especially on the posterior wall so I did not feel there was any advantage to dissecting this out further.  The patient was given 12,000 units of intravenous heparin.  The left and right internal iliac arteries were controlled with Henley clamps.  The left external iliac artery was controlled with a vessel loop and the right external iliac artery controlled with a cord clamp.  The left and right renal  arteries were controlled with Vesseloops.  A suprarenal clamp was then placed.  I then opened the aneurysm sac with cautery.  There was a large amount of thrombus and coils within the aneurysm sac.  There was some bleeding from the proximal aspect of the aorta on dissecting out to the proximal aspect of the graft.  This had a  fair amount of adhesions.  I was able to finally freed this up using blunt dissection.  The distal end of the stent graft was left in place at this point and clamped to reduce backbleeding.  The proximal end of the stent graft was completely removed.  There was some posterior wall bleeding.  Initially this appeared to be due to the aortic clamp not being all the way across the posterior wall but I added an additional clamp and press down and there was still bleeding from this.  Seemed that there was a posterior lumbar artery adjacent to where the renal arteries were several clips were placed in this location and we finally achieved hemostasis.  However, at this point the patient developed pulseless electrical activity.  CPR and AC LS protocol were activated.  The patient had lost about 1400 units of blood at this point and 900 had been replaced.  We gave several more units of blood as well as crystalloid.  Patient was also given epinephrine and multiple rounds.  CPR was continued throughout this portion of the case.  A transesophageal echocardiogram was placed down and there was fluid within the ventricle but there was minimal ventricular activity.  After approximately 30 to 45 minutes of CPR and ACLS it was decided to stop the CODE BLUE procedure.  The patient then expired.  Medical examiner was contacted.  The distal end of the aorta below the clamp was oversewn to prevent blood exsanguinating through the abdomen.  The distal end of the stent graft was also oversewn to prevent this.  The viscera were returned to their normal position.  The skin was then closed with staples.  I spoke with the patient's wife regarding the patient expiring in the OR.  The chaplain was also contacted to discuss with the wife visitation and postmortem arrangements.  I did discuss with the wife the possibility of postmortem exam.  She will discuss this further with the chaplain if she wishes to proceed.  [name], MD [facility] Office: [phone]

## 2019-04-03 NOTE — Anesthesia Procedure Notes (Signed)
Central Venous Catheter Insertion Performed by: Murvin Natal, MD, anesthesiologist Start/End12-25-20 6:45 AM, Apr 27, 2019 7:00 AM Patient location: Pre-op. Preanesthetic checklist: patient identified, IV checked, site marked, risks and benefits discussed, surgical consent, monitors and equipment checked, pre-op evaluation, timeout performed and anesthesia consent Position: Trendelenburg Lidocaine 1% used for infiltration and patient sedated Hand hygiene performed , maximum sterile barriers used  and Seldinger technique used Catheter size: 8 Fr Total catheter length 16. Central line was placed.Double lumen Procedure performed using ultrasound guided technique. Ultrasound Notes:anatomy identified, needle tip was noted to be adjacent to the nerve/plexus identified, no ultrasound evidence of intravascular and/or intraneural injection and image(s) printed for medical record Attempts: 1 Following insertion, dressing applied, line sutured and Biopatch. Post procedure assessment: blood return through all ports, free fluid flow and no air  Patient tolerated the procedure well with no immediate complications.

## 2019-04-03 NOTE — Anesthesia Procedure Notes (Signed)
Procedure Name: Intubation Date/Time: 2019-04-26 7:55 AM Performed by: Harden Mo, CRNA Pre-anesthesia Checklist: Patient identified, Emergency Drugs available, Suction available and Patient being monitored Patient Re-evaluated:Patient Re-evaluated prior to induction Oxygen Delivery Method: Circle System Utilized Preoxygenation: Pre-oxygenation with 100% oxygen Induction Type: IV induction Ventilation: Mask ventilation without difficulty and Oral airway inserted - appropriate to patient size Laryngoscope Size: Sabra Heck and 2 Grade View: Grade I Tube type: Oral Tube size: 7.5 mm Number of attempts: 1 Airway Equipment and Method: Stylet and Oral airway Placement Confirmation: ETT inserted through vocal cords under direct vision,  positive ETCO2 and breath sounds checked- equal and bilateral Secured at: 22 cm Tube secured with: Tape Dental Injury: Teeth and Oropharynx as per pre-operative assessment

## 2019-04-03 NOTE — Progress Notes (Signed)
Responded to contact by Director of OR explaining Mr. Daniel Blevins had expired during surgery.  Provider had spoken w/ Mrs. Daniel Blevins.  Mrs. Daniel Blevins had released the body to be taken to the morgue with plans to view him there later this afternoon.  Director and I collaborated along with [facility] Daniel Blevins on an alternative plan of moving Mr. Daniel Blevins to a PACU isolation bay for a more pleasant visit.  Having a plan in hand, OR Director asked me to call Daniel Blevins to determine her wishes.  Upon introducing myself it was apparent Mrs. Daniel Blevins was too upset to speak clearly.   Determining her pastor was with her I asked to speak with her.  Daniel Blevins was able to determine Mrs. Daniel Blevins would like to come to the hospital in the next few minutes.  Director of OR and I met Mrs. Daniel Blevins and Mrs. Daniel Blevins's best friend at the main entrance Daniel Blevins and escorted them back to Mr. Daniel Blevins.  The visit lasted about 20 minutes. Mrs. Daniel Blevins didn't have any medical questions, declined needing any more information from the provider saying she was very confident Daniel Blevins had done everything possible to help her husband.  During the time we were in the PACU area, two other nurses came up to Mrs. Daniel Blevins and expressed their condolences.  Everyone she met at [facility] was supportive of her needs and caring for her in this unexpected loss.  OR Director and I escorted Mrs. Daniel Blevins to the [employer] exit.

## 2019-04-03 NOTE — Anesthesia Procedure Notes (Signed)
Arterial Line Insertion Start/End12-09-2018 7:05 AM, April 07, 2019 7:16 AM Performed by: Harden Mo, CRNA, CRNA  Patient location: Pre-op. Preanesthetic checklist: patient identified, IV checked, site marked, risks and benefits discussed, surgical consent, monitors and equipment checked, pre-op evaluation and anesthesia consent Lidocaine 1% used for infiltration and patient sedated Left, radial was placed Catheter size: 20 G Hand hygiene performed  and maximum sterile barriers used  Allen's test indicative of satisfactory collateral circulation Attempts: 1 Procedure performed without using ultrasound guided technique. Ultrasound Notes:anatomy identified, needle tip was noted to be adjacent to the nerve/plexus identified and no ultrasound evidence of intravascular and/or intraneural injection Following insertion, dressing applied and Biopatch. Post procedure assessment: normal  Patient tolerated the procedure well with no immediate complications.

## 2019-04-03 NOTE — Progress Notes (Signed)
  Echocardiogram Echocardiogram Transesophageal has been performed.  Thad Osoria A Loleta Frommelt April 08, 2019, 1:14 PM

## 2019-04-03 DEATH — deceased

## 2019-04-04 MED FILL — Heparin Sodium (Porcine) Inj 1000 Unit/ML: INTRAMUSCULAR | Qty: 30 | Status: AC

## 2019-04-04 MED FILL — Sodium Chloride Irrigation Soln 0.9%: Qty: 3000 | Status: AC

## 2019-04-04 MED FILL — Sodium Chloride IV Soln 0.9%: INTRAVENOUS | Qty: 1000 | Status: AC
